# Patient Record
Sex: Male | Born: 1955 | Race: White | Hispanic: No | Marital: Married | Smoking: Never smoker
Health system: Southern US, Community
[De-identification: ages and names within clinical notes are randomized; demographics above are authoritative.]

## PROBLEM LIST (undated history)

## (undated) DIAGNOSIS — Z87442 Personal history of urinary calculi: Secondary | ICD-10-CM

## (undated) DIAGNOSIS — J4489 Other specified chronic obstructive pulmonary disease: Secondary | ICD-10-CM

## (undated) DIAGNOSIS — J309 Allergic rhinitis, unspecified: Secondary | ICD-10-CM

## (undated) DIAGNOSIS — J449 Chronic obstructive pulmonary disease, unspecified: Secondary | ICD-10-CM

## (undated) DIAGNOSIS — R911 Solitary pulmonary nodule: Secondary | ICD-10-CM

## (undated) DIAGNOSIS — J329 Chronic sinusitis, unspecified: Secondary | ICD-10-CM

## (undated) DIAGNOSIS — K219 Gastro-esophageal reflux disease without esophagitis: Secondary | ICD-10-CM

## (undated) DIAGNOSIS — J45909 Unspecified asthma, uncomplicated: Secondary | ICD-10-CM

## (undated) HISTORY — DX: Chronic sinusitis, unspecified: J32.9

## (undated) HISTORY — DX: Solitary pulmonary nodule: R91.1

## (undated) HISTORY — DX: Unspecified asthma, uncomplicated: J45.909

## (undated) HISTORY — DX: Allergic rhinitis, unspecified: J30.9

## (undated) HISTORY — DX: Personal history of urinary calculi: Z87.442

## (undated) HISTORY — PX: SYMPATHECTOMY: SHX792

## (undated) HISTORY — DX: Other specified chronic obstructive pulmonary disease: J44.89

## (undated) HISTORY — PX: LOBECTOMY: SHX5089

## (undated) HISTORY — DX: Gastro-esophageal reflux disease without esophagitis: K21.9

## (undated) HISTORY — PX: OTHER SURGICAL HISTORY: SHX169

## (undated) HISTORY — DX: Chronic obstructive pulmonary disease, unspecified: J44.9

---

## 1987-03-11 HISTORY — PX: OTHER SURGICAL HISTORY: SHX169

## 1988-03-10 HISTORY — PX: NASAL SINUS SURGERY: SHX719

## 1997-12-11 ENCOUNTER — Ambulatory Visit (HOSPITAL_COMMUNITY): Admission: RE | Admit: 1997-12-11 | Discharge: 1997-12-11 | Payer: Self-pay | Admitting: Internal Medicine

## 1998-02-06 ENCOUNTER — Encounter: Admission: RE | Admit: 1998-02-06 | Discharge: 1998-05-07 | Payer: Self-pay | Admitting: Neurosurgery

## 2002-12-28 ENCOUNTER — Encounter: Payer: Self-pay | Admitting: Internal Medicine

## 2002-12-28 ENCOUNTER — Ambulatory Visit (HOSPITAL_COMMUNITY): Admission: RE | Admit: 2002-12-28 | Discharge: 2002-12-28 | Payer: Self-pay | Admitting: Internal Medicine

## 2004-03-28 ENCOUNTER — Ambulatory Visit: Payer: Self-pay | Admitting: Internal Medicine

## 2004-04-17 ENCOUNTER — Ambulatory Visit: Payer: Self-pay | Admitting: Internal Medicine

## 2004-05-08 ENCOUNTER — Ambulatory Visit: Payer: Self-pay | Admitting: Internal Medicine

## 2004-05-22 ENCOUNTER — Ambulatory Visit: Payer: Self-pay | Admitting: Internal Medicine

## 2004-07-16 ENCOUNTER — Ambulatory Visit: Payer: Self-pay | Admitting: Internal Medicine

## 2005-04-28 ENCOUNTER — Ambulatory Visit: Payer: Self-pay | Admitting: Internal Medicine

## 2005-06-17 ENCOUNTER — Ambulatory Visit: Payer: Self-pay | Admitting: Internal Medicine

## 2005-12-30 ENCOUNTER — Ambulatory Visit: Payer: Self-pay | Admitting: Internal Medicine

## 2006-04-30 ENCOUNTER — Ambulatory Visit: Payer: Self-pay | Admitting: Internal Medicine

## 2006-06-02 ENCOUNTER — Ambulatory Visit: Payer: Self-pay | Admitting: Internal Medicine

## 2006-06-18 ENCOUNTER — Ambulatory Visit: Payer: Self-pay | Admitting: Internal Medicine

## 2006-07-09 ENCOUNTER — Ambulatory Visit: Payer: Self-pay | Admitting: Internal Medicine

## 2006-09-17 ENCOUNTER — Ambulatory Visit: Payer: Self-pay | Admitting: Internal Medicine

## 2007-01-18 ENCOUNTER — Ambulatory Visit: Payer: Self-pay | Admitting: Internal Medicine

## 2007-02-03 ENCOUNTER — Telehealth: Payer: Self-pay | Admitting: Internal Medicine

## 2007-02-15 ENCOUNTER — Telehealth (INDEPENDENT_AMBULATORY_CARE_PROVIDER_SITE_OTHER): Payer: Self-pay | Admitting: *Deleted

## 2007-03-18 DIAGNOSIS — J4489 Other specified chronic obstructive pulmonary disease: Secondary | ICD-10-CM | POA: Insufficient documentation

## 2007-03-18 DIAGNOSIS — J449 Chronic obstructive pulmonary disease, unspecified: Secondary | ICD-10-CM

## 2007-04-15 ENCOUNTER — Telehealth: Payer: Self-pay | Admitting: Internal Medicine

## 2007-05-11 ENCOUNTER — Telehealth: Payer: Self-pay | Admitting: Internal Medicine

## 2007-05-18 ENCOUNTER — Ambulatory Visit: Payer: Self-pay | Admitting: Internal Medicine

## 2007-06-08 ENCOUNTER — Encounter: Payer: Self-pay | Admitting: Internal Medicine

## 2007-10-12 ENCOUNTER — Ambulatory Visit: Payer: Self-pay | Admitting: Internal Medicine

## 2007-10-12 DIAGNOSIS — J329 Chronic sinusitis, unspecified: Secondary | ICD-10-CM

## 2007-11-25 ENCOUNTER — Encounter: Payer: Self-pay | Admitting: Internal Medicine

## 2007-11-27 ENCOUNTER — Emergency Department (HOSPITAL_COMMUNITY): Admission: EM | Admit: 2007-11-27 | Discharge: 2007-11-27 | Payer: Self-pay | Admitting: Emergency Medicine

## 2007-11-29 ENCOUNTER — Encounter: Payer: Self-pay | Admitting: Internal Medicine

## 2008-02-15 ENCOUNTER — Ambulatory Visit: Payer: Self-pay | Admitting: Internal Medicine

## 2008-06-15 ENCOUNTER — Telehealth (INDEPENDENT_AMBULATORY_CARE_PROVIDER_SITE_OTHER): Payer: Self-pay | Admitting: *Deleted

## 2008-07-14 ENCOUNTER — Telehealth (INDEPENDENT_AMBULATORY_CARE_PROVIDER_SITE_OTHER): Payer: Self-pay | Admitting: *Deleted

## 2009-01-08 ENCOUNTER — Telehealth: Payer: Self-pay | Admitting: Internal Medicine

## 2009-02-13 ENCOUNTER — Ambulatory Visit: Payer: Self-pay | Admitting: Internal Medicine

## 2009-03-15 ENCOUNTER — Telehealth (INDEPENDENT_AMBULATORY_CARE_PROVIDER_SITE_OTHER): Payer: Self-pay | Admitting: *Deleted

## 2009-05-10 ENCOUNTER — Telehealth: Payer: Self-pay | Admitting: Internal Medicine

## 2009-05-23 ENCOUNTER — Telehealth (INDEPENDENT_AMBULATORY_CARE_PROVIDER_SITE_OTHER): Payer: Self-pay | Admitting: *Deleted

## 2009-06-01 ENCOUNTER — Telehealth (INDEPENDENT_AMBULATORY_CARE_PROVIDER_SITE_OTHER): Payer: Self-pay | Admitting: *Deleted

## 2009-06-11 ENCOUNTER — Telehealth (INDEPENDENT_AMBULATORY_CARE_PROVIDER_SITE_OTHER): Payer: Self-pay | Admitting: *Deleted

## 2009-06-18 ENCOUNTER — Ambulatory Visit: Payer: Self-pay | Admitting: Internal Medicine

## 2009-06-18 ENCOUNTER — Telehealth (INDEPENDENT_AMBULATORY_CARE_PROVIDER_SITE_OTHER): Payer: Self-pay | Admitting: *Deleted

## 2009-06-18 DIAGNOSIS — J209 Acute bronchitis, unspecified: Secondary | ICD-10-CM

## 2009-06-26 ENCOUNTER — Telehealth (INDEPENDENT_AMBULATORY_CARE_PROVIDER_SITE_OTHER): Payer: Self-pay | Admitting: *Deleted

## 2009-06-27 ENCOUNTER — Encounter: Payer: Self-pay | Admitting: Internal Medicine

## 2009-06-27 ENCOUNTER — Telehealth: Payer: Self-pay | Admitting: Internal Medicine

## 2009-06-28 ENCOUNTER — Ambulatory Visit: Payer: Self-pay | Admitting: Internal Medicine

## 2009-06-28 ENCOUNTER — Telehealth: Payer: Self-pay | Admitting: Internal Medicine

## 2009-06-28 DIAGNOSIS — J984 Other disorders of lung: Secondary | ICD-10-CM

## 2009-09-25 ENCOUNTER — Ambulatory Visit: Payer: Self-pay | Admitting: Internal Medicine

## 2009-10-02 ENCOUNTER — Encounter: Payer: Self-pay | Admitting: Internal Medicine

## 2009-10-09 ENCOUNTER — Telehealth (INDEPENDENT_AMBULATORY_CARE_PROVIDER_SITE_OTHER): Payer: Self-pay | Admitting: *Deleted

## 2009-11-01 ENCOUNTER — Telehealth: Payer: Self-pay | Admitting: Internal Medicine

## 2009-11-15 ENCOUNTER — Telehealth (INDEPENDENT_AMBULATORY_CARE_PROVIDER_SITE_OTHER): Payer: Self-pay | Admitting: *Deleted

## 2009-12-17 ENCOUNTER — Ambulatory Visit: Payer: Self-pay | Admitting: Internal Medicine

## 2009-12-18 LAB — CONVERTED CEMR LAB: IgE (Immunoglobulin E), Serum: 66.4 intl units/mL (ref 0.0–180.0)

## 2010-01-17 ENCOUNTER — Ambulatory Visit: Payer: Self-pay | Admitting: Internal Medicine

## 2010-02-14 ENCOUNTER — Telehealth: Payer: Self-pay | Admitting: Internal Medicine

## 2010-02-18 ENCOUNTER — Encounter: Payer: Self-pay | Admitting: Internal Medicine

## 2010-03-08 ENCOUNTER — Telehealth (INDEPENDENT_AMBULATORY_CARE_PROVIDER_SITE_OTHER): Payer: Self-pay | Admitting: *Deleted

## 2010-03-10 HISTORY — PX: OTHER SURGICAL HISTORY: SHX169

## 2010-03-15 ENCOUNTER — Ambulatory Visit
Admission: RE | Admit: 2010-03-15 | Discharge: 2010-03-15 | Payer: Self-pay | Source: Home / Self Care | Attending: Internal Medicine | Admitting: Internal Medicine

## 2010-03-15 ENCOUNTER — Telehealth (INDEPENDENT_AMBULATORY_CARE_PROVIDER_SITE_OTHER): Payer: Self-pay | Admitting: *Deleted

## 2010-03-16 ENCOUNTER — Other Ambulatory Visit: Payer: Self-pay | Admitting: Internal Medicine

## 2010-03-18 ENCOUNTER — Ambulatory Visit
Admission: RE | Admit: 2010-03-18 | Discharge: 2010-03-18 | Payer: Self-pay | Source: Home / Self Care | Attending: Internal Medicine | Admitting: Internal Medicine

## 2010-03-18 ENCOUNTER — Ambulatory Visit: Admit: 2010-03-18 | Payer: Self-pay | Admitting: Internal Medicine

## 2010-03-18 DIAGNOSIS — J45909 Unspecified asthma, uncomplicated: Secondary | ICD-10-CM | POA: Insufficient documentation

## 2010-03-18 LAB — HEPATIC FUNCTION PANEL
ALT: 34 U/L (ref 0–53)
AST: 29 U/L (ref 0–37)
Albumin: 4 g/dL (ref 3.5–5.2)
Alkaline Phosphatase: 64 U/L (ref 39–117)
Bilirubin, Direct: 0.2 mg/dL (ref 0.0–0.3)
Total Bilirubin: 1 mg/dL (ref 0.3–1.2)
Total Protein: 7 g/dL (ref 6.0–8.3)

## 2010-03-19 ENCOUNTER — Encounter: Payer: Self-pay | Admitting: Internal Medicine

## 2010-03-26 ENCOUNTER — Telehealth (INDEPENDENT_AMBULATORY_CARE_PROVIDER_SITE_OTHER): Payer: Self-pay | Admitting: *Deleted

## 2010-04-09 NOTE — Miscellaneous (Signed)
Summary: Orders Update-CT instead of CXR/kcw  Brent at Radiology called stating that the radiologist wishes to perform CT chest noncontrast to compare with April CT instead of CXR (ordered by [name] at 7-11 OV). Spoke with [name] and he stated that the order was approved to give verbal to Brent-just as long as pt is aware and is okay with the order change. Kipp Brood requested an order be faxed to [phone]; order placed and faxed.Reynaldo Minium CMA  October 02, 2009 4:53 PM                Clinical Lists Changes  Orders: Added new Test order of CT without Contrast (CT w/o contrast) - Signed

## 2010-04-09 NOTE — Progress Notes (Signed)
Summary: Eric [name]  Phone Note Call from Patient Call back at [phone]   Caller: Spouse Call For: young Reason for Call: Talk to Nurse Summary of Call: [facility] sample has been working well - couls you call this in? [facility] [address] Initial call taken by: Eugene Gavia,  March 15, 2009 10:25 AM  Follow-up for Phone Call        Pt wants rx for dulera, states this has been working well for him. Doe she need to stop any of his other inhalers. Please advise. Carron Curie CMA  March 15, 2009 10:29 AM   Additional Follow-up for Phone Call Additional follow up Details #1::        Please put Dulera 200/5, # 1, 2 puffs and rinse twice daily, on his list. this replaces Advair. Ref as needed  Additional Follow-up by: Waymon Budge MD,  March 15, 2009 1:34 PM    Additional Follow-up for Phone Call Additional follow up Details #2::    rx sent to pharmacy, wife aware rx sent and also that this med will replce the advair Follow-up by: Philipp Deputy CMA,  March 15, 2009 2:24 PM  New/Updated Medications: DULERA 200-5 MCG/ACT AERO (MOMETASONE FURO-FORMOTEROL FUM) 2 pufs twice a day and rinse after each use Prescriptions: DULERA 200-5 MCG/ACT AERO (MOMETASONE FURO-FORMOTEROL FUM) 2 pufs twice a day and rinse after each use  #1 x 6   Entered by:   Philipp Deputy CMA   Authorized by:   Waymon Budge MD   Signed by:   Philipp Deputy CMA on 03/15/2009   Method used:   [facility]  [address] [id number]       [address]       [place], [place]  [address]       Ph: [phone] or [phone]       Fax: [phone]   RxID:   [id number]

## 2010-04-09 NOTE — Assessment & Plan Note (Signed)
Summary: 3 months/apc   Primary Provider/Referring Provider:  Kirby [name]  CC:  3 month follow up visit-COPD and asthma. Denies any asthma attacks but agrees to slight SOB and wheezing.Marland Kitchen  History of Present Illness: June 18, 2009- Chronic asthma/ COPD, rhinosinusitis He comes for acute visit reporting onset over past 3 days of lightly pleuritic pain in left mid chest with green, blood streaked sputum but no fever. He thinks this may be a recurrence of the moraxella. bronchopneumonia pattern that responded to augmentin x 14 days. Repsonse was slower and less clear- cut than in past. 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 Augmentin gave best zone size on the plate last time. No sinus problem or headache, but he keeps nasal congestion during bronchitis flares. He is currently on 15 mg prednisone daily.  June 28, 2009- Chronic asthma,/ COPD, rhinosinusitis...................Marland Kitchenwife here Sputum has cleared from brown to his usual yellow after 7 days Avelox finshed 5 days ago. Still a little more dyspneic and raspy through his chest than usual, without fever. Because of persistent pleuritic pain left anterolateral chest>> D-dimer elevated, raising concern VTE. CT report reviewed with him- density posterior right lung. DDX includes mucus plug, pneumonia, ATX or less likely- a tumor. We will get the disc sent over.  September 25, 2009- Chronic obstructive asthma, rhinosinusitis, Abnormal Xray We gave prednisone and Cefdinir last visit. He has remained symptomatic on prednisone 30 mg over the last few weeks. ENT gave him clindamycin x 7 days for sialitis/ salivary stone. He just finished a second round of clindamycin and with that, he has been down to 10 mg prednisone daily fo the last 5 days. We discussed anaerobic coverage, possibly with flagyl which might be better tolerated than clindamycin. Marland Kitchen Resection has been suggested. CT chest in [facility] in April showed a soft tissue nodue suspicious  for lymph node- to be followed.    Asthma History    Initial Asthma Severity Rating:    Age range: 12+ years    Symptoms: daily    Nighttime Awakenings: 3-4/month    Interferes w/ normal activity: minor limitations    SABA use (not for EIB): several times per day    Asthma Severity Assessment: Severe Persistent   Preventive Screening-Counseling & Management  Alcohol-Tobacco     Smoking Status: never  Current Medications (verified): 1)  Fluticasone Propionate 50 Mcg/act Susp (Fluticasone Propionate) .... Spray 1 Spray Into Both  Nostrils Once A Day 2)  Ventolin Hfa 108 (90 Base) Mcg/act Aers (Albuterol Sulfate) .... 2 Puffs Four Times A Day As Needed Rescue 3)  Albuterol Sulfate 4 Mg  Xr12h-Tab (Albuterol Sulfate) .Marland Kitchen.. 1 Twice Daily As Needed 4)  Prednisone 10 Mg Tabs (Prednisone) .... Take 1 By Mouth Once Daily 5)  Albuterol Sulfate (2.5 Mg/77ml) 0.083%  Nebu (Albuterol Sulfate) .... Four Times Daily or Every 6 Hours As Needed 6)  Ipratropium Bromide 0.02 % Soln (Ipratropium Bromide) .Marland Kitchen.. 1 Vial in Hhn Four Times A Day As Needed 7)  Advair Diskus 250-50 Mcg/dose Aepb (Fluticasone-Salmeterol) .Marland Kitchen.. 1 Puff Two Times A Day and Rinse Mouth Well 8)  Calcium-Vitamin D 600-200 Mg-Unit Tabs (Calcium-Vitamin D) .... Two Times A Day 9)  Mucinex 600 Mg Xr12h-Tab (Guaifenesin) .... As Needed  Allergies (verified): 1)  ! 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  The patient denies shortness of breath at rest, productive cough, coughing up blood, chest pain, irregular heartbeats, acid heartburn, indigestion, loss of appetite, weight change, abdominal pain, difficulty swallowing, sore throat, tooth/dental problems, headaches, nasal congestion/difficulty breathing through nose, and sneezing.         Some soreness in hips at lower prenisone dose.  Vital Signs:  Patient profile:   55 year old male Height:      68 inches Weight:      180.38 pounds BMI:     27.53 O2 Sat:      95 % on Room air Pulse rate:   84 / minute BP sitting:   118 / 88  (right arm) Cuff size:   regular  Vitals Entered By: Reynaldo Minium CMA (September 25, 2009 4:10 PM)  O2 Flow:  Room air CC: 3 month follow up visit-COPD and asthma. Denies any asthma attacks but agrees to slight SOB and wheezing.   Physical Exam  Additional Exam:  General: A/Ox3; pleasant and cooperative, NAD, SKIN: no rash, lesions NODES: no lymphadenopathy HEENT: [place]/AT, EOM- WNL, Conjuctivae- clear, PERRLA, periorbital edema, TM-WNL, Nose- mucus bridging, stuffy, Throat-  NECK: Supple w/ fair ROM, JVD- none, normal carotid impulses w/o bruits Thyroid- CHEST: coarse wheeze/ rhonchi bilaterally, unlabored, without rales or rub.Better than usual. HEART: RRR, no m/g/r heard ABDOMEN: Soft and nl;  ZOX:WRUE, nl pulses, no edema  NEURO: Grossly intact to observation      Impression & Recommendations:  Problem # 1:  ASTHMA (ICD-493.90) Chronic obstructive asthma. We discussed possibility that an antibiotic addressing his salivary anaerobes might calm his asthma and allow reduced steroids. We will give flagyl, which is  likely to cause less problem with colitis. Discussed steroid withdrawal.  Problem # 2:  LUNG NODULE (ICD-518.89)  We reviewed the CT report from April again. We will have him get a f/u CXR in [facility] to compare. We can do another CT if necessary.  Medications Added to Medication List This Visit: 1)  Prednisone 10 Mg Tabs (Prednisone) .... Take 1 by mouth once daily 2)  Advair Diskus 250-50 Mcg/dose Aepb (Fluticasone-salmeterol) .Marland Kitchen.. 1 puff two times a day and rinse mouth well 3)  Metronidazole 250 Mg Tabs (Metronidazole) .Marland Kitchen.. 1 three times a day  Other Orders: Est. Patient Level III ([address])  Patient Instructions: 1)  Please schedule a follow-up appointment in 3 months. 2)  Script sent for flagyl/ metronidazole 3)  Handwritten order for CXR to follow up the right lung nodule seen on CT in April. When I see that report we can discuss whether a CXR is adequate for tracking this. Prescriptions: METRONIDAZOLE 250 MG TABS (METRONIDAZOLE) 1 three times a day  #21 x 2   Entered and Authorized by:   Waymon Budge MD   Signed by:   Waymon Budge MD on 09/25/2009   Method used:   [facility]  [address] [id number]       [address]       [place], [place]  [address]       Ph: [phone] or [phone]       Fax: [phone]   RxID:   [id number]

## 2010-04-09 NOTE — Progress Notes (Signed)
Summary: asthma flare > ok for pred taper   Phone Note Call from Patient Call back at [phone]   Caller: Patient Call For: young Reason for Call: Talk to Nurse Summary of Call: trouble w/asthma, no fever, tightness in chest, congestion, going on a couple of weeks. [facility] [address] Initial call taken by: Eugene Gavia,  November 15, 2009 9:55 AM  Follow-up for Phone Call        called spoke with patient who states that he is having an asthma flare x2weeks w/ tightness in chest, wheezing, increased SOB and prod cough with a slight yellow mucus.  pt denies f/c/s.  pt states that 2 weeks ago (at onset of symptoms) he increased his prednisone to 30mg  with no relief; increased prednisone to 40mg  2 days ago.  would also like a new rx for prednisone since he had to increase the dose - requesting 5mg  #200.  please advise, thanks!  ALLERGIES: e-mycin, pcn.  last ov 09-25-09, next 12-17-09. Follow-up by: Boone Master CNA/MA,  November 15, 2009 10:39 AM  Additional Follow-up for Phone Call Additional follow up Details #1::        I sent the prednisone script [facility] [address] Please let him know, and ask him to let me know if he doesn't get better. Additional Follow-up by: Waymon Budge MD,  November 15, 2009 12:19 PM    Additional Follow-up for Phone Call Additional follow up Details #2::    ATC pt to inform him of [name]'s recs as stated above.  LMOM TCB. Boone Master CNA/MA  November 15, 2009 12:27 PM   PT RETURNED CALL [phone].Marland KitchenChantel Bowne  November 15, 2009 12:39 PM  Additional Follow-up for Phone Call Additional follow up Details #3:: Details for Additional Follow-up Action Taken: Spoke with pt and notified that rx was sent for prednisone and that he should call back i57f not improving.  Pt verbalized understanding. Additional Follow-up by: Vernie Murders,  November 15, 2009 1:50 PM  New/Updated Medications: PREDNISONE 5 MG TABS (PREDNISONE) 40 mg daily, taper as  directed Prescriptions: PREDNISONE 5 MG TABS (PREDNISONE) 40 mg daily, taper as directed  #200 x 0   Entered and Authorized by:   Waymon Budge MD   Signed by:   Waymon Budge MD on 11/15/2009   Method used:   [facility]        [facility]  [address] [id number]       [address]       [place], [place]  [address]       Ph: [phone] or [phone]       Fax: [phone]   RxID:   [id number]

## 2010-04-09 NOTE — Progress Notes (Signed)
Summary: Wants to try Daliresp  Phone Note Call from Patient Call back at [phone]   Caller: Patient Call For: Eric [name] Reason for Call: Talk to Doctor Summary of Call: pt want to know about new medication for asthma dr Eric [name] talked to him about. Initial call taken by: Rickard Patience,  November 01, 2009 10:38 AM  Follow-up for Phone Call        Pt c/o increased usage of Ventolin HFA 2 puffs every 6 hours, neb treatments three times a day, and remaining medications as directed. Is doing Prednisone 5mg /10mg  rotation and wishes not to increase. Pt c/o increased chest congestion, non-productive cough, and wheezing. Pt denies fever. Pt does not recall the name of medication he and CY discussed during last OV but would like to try the sample now if CY thinks it is okay. Please advise. Thanks. Eric [name] CMA  November 01, 2009 11:01 AM   Allergies (verified):  1)  ! Pcn 2)  Erythromycin Ethylsuccinate    Additional Follow-up for Phone Call Additional follow up Details #1::        Discussed Daliresp, especially reported side effects including GI distress, depression, weight loss and the usual caution with any new drug. He wants to try it and will pick up samples. He has been feeling fatigued while alternating pred 10 mg/ 5 mg every other day. Discussed adrenal insufficiency. Additional Follow-up by: Waymon Budge MD,  November 01, 2009 9:36 PM    New/Updated Medications: * DALIRESP 500 MCG 1 daily Prescriptions: DALIRESP 500 MCG 1 daily  #30 x 5   Entered and Authorized by:   Waymon Budge MD   Signed by:   Waymon Budge MD on 11/01/2009   Method used:   Print then Give to Patient   RxID:   [id number]   Appended Document: Wants to try Daliresp    Clinical Lists Changes  Medications: Rx of DALIRESP 500 MCG 1 daily;  #30 x 5;  Signed;  Entered by: Reynaldo Minium CMA;  Authorized by: Waymon Budge MD;  Method used: [facility]  [address] [id number] [address], [place], [place]  [address], Ph: [phone] or [phone], Fax: [phone]    Prescriptions: DALIRESP 500 MCG 1 daily  #30 x 5   Entered by:   Reynaldo Minium CMA   Authorized by:   Waymon Budge MD   Signed by:   Reynaldo Minium CMA on 11/02/2009   Method used:   Telephoned to ...       [facility]  [address] [id number]       [address]       [place], [place]  [address]       Ph: [phone] or [phone]       Fax: [phone]   RxID:   [id number]

## 2010-04-09 NOTE — Letter (Signed)
Summary: Orders for Labs & CTA Chest/[facility]  Orders for Labs & CTA Chest/[facility]   Imported By: Sherian Rein 07/04/2009 09:43:44  _____________________________________________________________________  External Attachment:    Type:   Image     Comment:   External Document

## 2010-04-09 NOTE — Progress Notes (Signed)
Summary: rx  Phone Note Call from Patient Call back at [phone]   Caller: Spouse-Laura Call For: young Reason for Call: Refill Medication, Talk to Nurse Summary of Call: pt continues to have congestion, wheezing, feels he needs another round of antibiotics.   [facility] for [facility]ill [facility] [address] Initial call taken by: Eugene Gavia,  June 01, 2009 10:19 AM  Follow-up for Phone Call        called and spoke with pt.  pt states he was give rx for Augmentin 875 two times a day for 7 days on 05-23-2009.  pt states he has finished abx but believes he needs another round.  pt c/o still having chest congestion, coughing up yellow sputum and increased sob.  please advise.  thanks.  Aundra Millet Reynolds LPN  June 01, 2009 10:26 AM   allergies:  avelox, e-mycin  Additional Follow-up for Phone Call Additional follow up Details #1::        Please let pt know that Augmentin refill has been sent to pharmacy per [name].Reynaldo Minium CMA  June 01, 2009 4:17 PM      Additional Follow-up for Phone Call Additional follow up Details #2::    called and spoke with pt's wife and informed her rx sent to pharmacy.  Aundra Millet Reynolds LPN  June 01, 2009 4:41 PM

## 2010-04-09 NOTE — Assessment & Plan Note (Signed)
Summary: rov 6 months///kp   Primary Provider/Referring Provider:  [name]  CC:  6 month follow up visit-COPD and asthma; SOB, wheezing, chest congestion, and cough-occasionally productive-slight yellow in color..  History of Present Illness: June 28, 2009- Chronic asthma,/ COPD, rhinosinusitis...................Marland Kitchenwife here Sputum has cleared from brown to his usual yellow after 7 days Avelox finshed 5 days ago. Still a little more dyspneic and raspy through his chest than usual, without fever. Because of persistent pleuritic pain left anterolateral chest>> D-dimer elevated, raising concern VTE. CT report reviewed with him- density posterior right lung. DDX includes mucus plug, pneumonia, ATX or less likely- a tumor. We will get the disc sent over.  September 25, 2009- Chronic obstructive asthma, rhinosinusitis, Abnormal Xray We gave prednisone and Cefdinir last visit. He has remained symptomatic on prednisone 30 mg over the last few weeks. ENT gave him clindamycin x 7 days for sialitis/ salivary stone. He just finished a second round of clindamycin and with that, he has been down to 10 mg prednisone daily fo the last 5 days. We discussed anaerobic coverage, possibly with flagyl which might be better tolerated than clindamycin. Marland Kitchen Resection has been suggested. CT chest in [facility] in April showed a soft tissue nodue suspicious for lymph node- to be followed.  December 17, 2009- Chronic obstructive asthma, rhinosinusitis, Abnormal Xray cc: 6 month follow up visit-COPD and asthma; SOB, wheezing, chest congestion, cough-occasionally productive-slight yellow in color. Increased SOB, wheeze, chest congestion. Easy DOE. Coughs if he laughs. He can't get below prednisone alternating 15 w/ 20 mg every other day. He tried [employer]- thought it depressed him. We discussed this. We discussed steroid sparing strategies. Discussed trial of Xolair years ago- stopped when he thought it caused sinusitis. CT chest done  at [facility] 10/02/09- resolution of the RLL density reported in April. I suspect this was retained mucus.  Dr Lazarus Salines had seen him and done sinus CT for chronic sinusitis, recommending surgery. Rocky Link is not interested in sinus surgery but might be willing to discuss nasal aerosol antibiotics.   Asthma History    Asthma Control Assessment:    Age range: 12+ years    Symptoms: >2 days/week    Nighttime Awakenings: 0-2/month    Interferes w/ normal activity: some limitations    SABA use (not for EIB): >2 days/week    Asthma Control Assessment: Not Well Controlled   Preventive Screening-Counseling & Management  Alcohol-Tobacco     Smoking Status: never  Current Medications (verified): 1)  Fluticasone Propionate 50 Mcg/act Susp (Fluticasone Propionate) .... Spray 1 Spray Into Both  Nostrils Once A Day 2)  Ventolin Hfa 108 (90 Base) Mcg/act Aers (Albuterol Sulfate) .... 2 Puffs Four Times A Day As Needed Rescue 3)  Albuterol Sulfate 4 Mg  Xr12h-Tab (Albuterol Sulfate) .Marland Kitchen.. 1 Twice Daily As Needed 4)  Prednisone 10 Mg Tabs (Prednisone) .... Take 1 By Mouth Once Daily 5)  Albuterol Sulfate (2.5 Mg/35ml) 0.083%  Nebu (Albuterol Sulfate) .... Four Times Daily or Every 6 Hours As Needed 6)  Ipratropium Bromide 0.02 % Soln (Ipratropium Bromide) .Marland Kitchen.. 1 Vial in Hhn Four Times A Day As Needed 7)  Advair Diskus 250-50 Mcg/dose Aepb (Fluticasone-Salmeterol) .Marland Kitchen.. 1 Puff Two Times A Day and Rinse Mouth Well 8)  Calcium-Vitamin D 600-200 Mg-Unit Tabs (Calcium-Vitamin D) .... Two Times A Day 9)  Mucinex 600 Mg Xr12h-Tab (Guaifenesin) .... As Needed 10)  Metronidazole 250 Mg Tabs (Metronidazole) .Marland Kitchen.. 1 Three Times A Day 11)  Prednisone 5 Mg  Tabs (Prednisone) .... 40 Mg Daily, Taper As Directed  Allergies (verified): 1)  ! Pcn 2)  ! * Daliresp 3)  Erythromycin Ethylsuccinate  Past History:  Past Medical History: Last updated: 06/28/2009 SINUSITIS (ICD-473.9) ALLERGIC RHINITIS (ICD-477.9) C O P D  (ICD-496) ASTHMA (ICD-493.90) Lung nodule- 13x41mm posterior RLL CT 06/27/09  Past Surgical History: Last updated: 05/18/2007 Right middle lobectomy age 66 with tracheostomy Right carotid body removed (sympathectomy) for asthma many asthma hospitalizations ventilator dependent respiratory failure- asthma- 1989  Family History: Last updated: 05/18/2007 mother- lymphoepithelial cancer Father, brother- allergic asthma MGF- testicular cancer  Social History: Last updated: 05/18/2007 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  The patient denies coughing up blood, chest pain, irregular heartbeats, acid heartburn, indigestion, loss of appetite, weight change, abdominal pain, difficulty swallowing, sore throat, tooth/dental problems, headaches, itching, ear ache, anxiety, depression, hand/feet swelling, rash, change in color of mucus, and fever.    Vital Signs:  Patient profile:   55 year old male Height:      68 inches Weight:      180.25 pounds BMI:     27.51 O2 Sat:      95 % on Room air Pulse rate:   70 / minute BP sitting:   114 / 80  (left arm) Cuff size:   regular  Vitals Entered By: Reynaldo Minium CMA (December 17, 2009 4:17 PM)  O2 Flow:  Room air CC: 6 month follow up visit-COPD and asthma; SOB, wheezing, chest congestion, cough-occasionally productive-slight yellow in color.   Physical Exam  Additional Exam:  General: A/Ox3; pleasant and cooperative, NAD, SKIN: no rash, lesions NODES: no lymphadenopathy HEENT: [place]/AT, EOM- WNL, Conjuctivae- clear, PERRLA, periorbital edema, TM-WNL, Nose- mucus bridging, stuffy, Throat-  NECK: Supple w/ fair ROM, JVD- none, normal carotid impulses w/o bruits  Thyroid- CHEST: coarse wheeze/ rhonchi bilaterally, unlabored HEART: RRR, no m/g/r heard ABDOMEN: Soft and nl;  RSW:NIOE, nl pulses, no edema  NEURO: Grossly intact to observation      Impression & Recommendations:  Problem # 1:  ASTHMA (ICD-493.90) Severe chonic steroid dependent Long talk again  Retry Daliresp IgE for retry Xolair Offerred referral again- [facility] or [facility] ? depo 80 for comparison  Problem # 2:  RHINOSINUSITIS, RECURRENT (ICD-473.9) Discussed return to Dr Lazarus Salines  to consider nasal antibiotics, perhap s after heavier steroid suppressive course so antibiotics can get in.  He has reun high IgE and we discussed allergic fungal or airway/ ABPA syndromes again.  Medications Added to Medication List This Visit: 1)  Daliresp 500 Mcg Tabs (Roflumilast) .Marland Kitchen.. 1 daily with food 2)  Singulair 10 Mg Tabs (Montelukast sodium) .Marland Kitchen.. 1 daily  Other Orders: Est. Patient Level IV (70350) T-IgE (Immunoglobulin E) (09381-82993) Admin of Therapeutic Inj  intramuscular or subcutaneous (71696) Depo- Medrol 80mg  (J1040)  Patient Instructions: 1)  Please schedule a follow-up appointment in 1 month. We will look at status then and consider referral to [facility] for second opinion. Also consider return to Dr Lazarus Salines to consider nasal antibiotic aerosol.  2)  Depo 80 3)  If you need to increase prednisone up to 40 or so mg/ day for a few days, then do. 4)  Sample/ script Singulair  10 mg, 1 daily 5)  Sample/ script  Daliresp - 1 daily with food 6)  lab Prescriptions: SINGULAIR 10 MG TABS (MONTELUKAST SODIUM) 1 daily  #30 x prn   Entered and Authorized by:   Waymon Budge MD   Signed by:   Waymon Budge MD on 12/17/2009   Method used:   Print then Give to Patient   RxID:   [id number] DALIRESP 500 MCG TABS (ROFLUMILAST) 1 daily with food  #30 x prn   Entered and Authorized by:   Waymon Budge MD   Signed by:   Waymon Budge MD on 12/17/2009   Method used:   Print  then Give to Patient   RxID:   [id number] IPRATROPIUM BROMIDE 0.02 % SOLN (IPRATROPIUM BROMIDE) 1 vial in HHN four times a day as needed  #360 x 3   Entered and Authorized by:   Waymon Budge MD   Signed by:   Waymon Budge MD on 12/17/2009   Method used:   Print then Give to Patient   RxID:   [id number] ALBUTEROL SULFATE (2.5 MG/3ML) 0.083%  NEBU (ALBUTEROL SULFATE) Four times daily or every 6 hours as needed  #360 x 3   Entered and Authorized by:   Waymon Budge MD   Signed by:   Waymon Budge MD on 12/17/2009   Method used:   Print then Give to Patient   RxID:   [id number]      Medication Administration  Injection # 1:    Medication: Depo- Medrol 80mg     Diagnosis: ASTHMA (ICD-493.90)    Route: SQ    Site: RUOQ gluteus    Exp Date: 06/2012    Lot #: [id number]    Mfr: [employer]    Patient tolerated injection without complications    Given by: Reynaldo Minium CMA (December 17, 2009 5:32 PM)  Orders Added: 1)  Est. Patient Level IV [42595] 2)  T-IgE (Immunoglobulin E) [63875-64332] 3)  Admin of Therapeutic Inj  intramuscular or subcutaneous [96372] 4)  Depo- Medrol 80mg  [J1040]

## 2010-04-09 NOTE — Progress Notes (Signed)
Summary: prescript for albuterol nebs  Phone Note Call from Patient Call back at [phone]   Caller: Patient Call For: young Summary of Call: need albuterol sulfate nebulizer .083 % sulution sent to [facility]  Initial call taken by: Rickard Patience,  June 11, 2009 11:47 AM  Follow-up for Phone Call        [facility] informing pt rx sent to pharmacy.  Aundra Millet Reynolds LPN  June 12, 4538 11:54 AM     Prescriptions: ALBUTEROL SULFATE (2.5 MG/3ML) 0.083%  NEBU (ALBUTEROL SULFATE) Four times daily or every 6 hours as needed  #360 x 1   Entered by:   Arman Filter LPN   Authorized by:   Waymon Budge MD   Signed by:   Arman Filter LPN on 98/01/9146   Method used:   Electronically to        [facility]* (mail-order)             ,          Ph: [phone]       Fax: [phone]   RxID:   [id number]

## 2010-04-09 NOTE — Progress Notes (Signed)
Summary: speak to nurse  Phone Note Call from Patient Call back at [phone]   Caller: Spouse//laura Call For: young Summary of Call: Pt did sputum grew moraxella would like an antibiotic called in preferabley augmentin.//[facility] Initial call taken by: Darletta Moll,  May 23, 2009 10:03 AM  Follow-up for Phone Call        [facility] Carron Curie CMA  May 23, 2009 10:33 AM  wife states dr young is aware pt does these cultures on himself when needed and this culture had a good zone for treatment with augmentin wants rx for this [facility] to [facility] [address]-no need for call back unless cy is not going to give med--pls advise  Follow-up by: Philipp Deputy CMA,  May 23, 2009 11:04 AM  Additional Follow-up for Phone Call Additional follow up Details #1::        Per [name]- give Augmentin 875mg . #14 take 1 by mouth two times a day. 1 refill.Reynaldo Minium CMA  May 23, 2009 11:26 AM     New/Updated Medications: AUGMENTIN 875-125 MG TABS (AMOXICILLIN-POT CLAVULANATE) 1 by mouth twice a day Prescriptions: AUGMENTIN 875-125 MG TABS (AMOXICILLIN-POT CLAVULANATE) 1 by mouth twice a day  #14 x 0   Entered by:   Philipp Deputy CMA   Authorized by:   Waymon Budge MD   Signed by:   Philipp Deputy CMA on 05/23/2009   Method used:   [facility]  [address] [id number]       [address]       [place], [place]  [address]       Ph: [phone] or [phone]       Fax: [phone]   RxID:   [id number]

## 2010-04-09 NOTE — Assessment & Plan Note (Signed)
Summary: f/u per [name] ///kp   Primary Quinisha Mould/Referring Isreal Moline:  [name]  CC:  follow up visit-stillhaving SOB and wheezing at times; Breathing better than last time.Marland Kitchen  History of Present Illness: December 17, 2009- Chronic obstructive asthma, rhinosinusitis, Abnormal Xray cc: 6 month follow up visit-COPD and asthma; SOB, wheezing, chest congestion, cough-occasionally productive-slight yellow in color. Increased SOB, wheeze, chest congestion. Easy DOE. Coughs if he laughs. He can't get below prednisone alternating 15 w/ 20 mg every other day. He tried [employer]- thought it depressed him. We discussed this. We discussed steroid sparing strategies. Discussed trial of Xolair years ago- stopped when he thought it caused sinusitis. CT chest done at [facility] 10/02/09- resolution of the RLL density reported in April. I suspect this was retained mucus.  Dr Lazarus Salines had seen him and done sinus CT for chronic sinusitis, recommending surgery. Rocky Link is not interested in sinus surgery but might be willing to discuss nasal aerosol antibiotics.   January 17, 2010-  Chronic obstructive asthma, rhinosinusitis, Abnormal Xray Currently doing better than last visit, but not sure why. He is taking SlowMag 1 tab. Put off retrying Daliresp because he has felt down in the dumps as he pulled prednisone down to 10 alt w/ 5 every other day. Not reallly sure why he is better. Daily still nasal congestion slighlty yellow. Cough is comfortably productive- slightly yellow. Sense of smell again left as he reduced prednisone, feeling a little stuffy. Right ear won't pop. Tried Xolair in past but stopped during serial sinus infections. We discussed current understanding of side effects. He willl check on opneumovax status  Asthma History    Asthma Control Assessment:    Age range: 12+ years    Symptoms: throughout the day    Nighttime Awakenings: 0-2/month    Interferes w/ normal activity: some limitations    SABA use  (not for EIB): >2 days/week    Asthma Control Assessment: Very Poorly Controlled   Preventive Screening-Counseling & Management  Alcohol-Tobacco     Smoking Status: never  Current Medications (verified): 1)  Fluticasone Propionate 50 Mcg/act Susp (Fluticasone Propionate) .... Spray 1 Spray Into Both  Nostrils Once A Day 2)  Ventolin Hfa 108 (90 Base) Mcg/act Aers (Albuterol Sulfate) .... 2 Puffs Four Times A Day As Needed Rescue 3)  Albuterol Sulfate 4 Mg  Xr12h-Tab (Albuterol Sulfate) .Marland Kitchen.. 1 Twice Daily As Needed 4)  Prednisone 10 Mg Tabs (Prednisone) .... Take 1/2 By Mouth Every Other Day and Then 1 By Mouth Every Other Day 5)  Albuterol Sulfate (2.5 Mg/61ml) 0.083%  Nebu (Albuterol Sulfate) .... Four Times Daily or Every 6 Hours As Needed 6)  Ipratropium Bromide 0.02 % Soln (Ipratropium Bromide) .Marland Kitchen.. 1 Vial in Hhn Four Times A Day As Needed 7)  Advair Diskus 250-50 Mcg/dose Aepb (Fluticasone-Salmeterol) .Marland Kitchen.. 1 Puff Two Times A Day and Rinse Mouth Well 8)  Calcium-Vitamin D 600-200 Mg-Unit Tabs (Calcium-Vitamin D) .... Two Times A Day 9)  Mucinex 600 Mg Xr12h-Tab (Guaifenesin) .... As Needed 10)  Metronidazole 250 Mg Tabs (Metronidazole) .Marland Kitchen.. 1 Three Times A Day 11)  Daliresp 500 Mcg Tabs (Roflumilast) .Marland Kitchen.. 1 Daily With Food 12)  Singulair 10 Mg Tabs (Montelukast Sodium) .Marland Kitchen.. 1 Daily  Allergies (verified): 1)  ! Pcn 2)  ! * Daliresp 3)  Erythromycin Ethylsuccinate  Past History:  Past Medical History: Last updated: 06/28/2009 SINUSITIS (ICD-473.9) ALLERGIC RHINITIS (ICD-477.9) C O P D (ICD-496) ASTHMA (ICD-493.90) Lung nodule- 13x66mm posterior RLL CT 06/27/09  Past  Surgical History: Last updated: 05/18/2007 Right middle lobectomy age 83 with tracheostomy Right carotid body removed (sympathectomy) for asthma many asthma hospitalizations ventilator dependent respiratory failure- asthma- 1989  Family History: Last updated: 05/18/2007 mother- lymphoepithelial cancer Father,  brother- allergic asthma MGF- testicular cancer  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  The patient denies shortness of breath at rest, non-productive cough, coughing up blood, chest pain, irregular heartbeats, acid heartburn, indigestion, loss of appetite, weight change, abdominal pain, difficulty swallowing, sore throat, tooth/dental problems, headaches, sneezing, itching, ear ache, hand/feet swelling, joint stiffness or pain, and fever.    Vital Signs:  Patient profile:   55 year old male Height:      68 inches Weight:      181.38 pounds BMI:     27.68 O2 Sat:      96 % on Room air Pulse rate:   83 / minute BP sitting:   124 / 72  (right arm) Cuff size:   regular  Vitals Entered By: Reynaldo Minium CMA (January 17, 2010 4:35 PM)  O2 Flow:  Room air CC: follow up visit-stillhaving SOB and wheezing at times; Breathing better than last time.   Physical Exam  Additional Exam:  General: A/Ox3; pleasant and cooperative, NAD, SKIN: no rash, lesions NODES: no lymphadenopathy HEENT: [place]/AT, EOM- WNL, Conjuctivae- clear, PERRLA, increased  periorbital edema, TM-WNL, Nose- mucus bridging, stuffy, Throat-  NECK: Supple w/ fair ROM, JVD- 1+/ fill from below, normal carotid impulses w/o bruits Thyroid- CHEST: no wheeze, fair airflow. HEART: RRR, no m/g/r heard ABDOMEN: Soft and nl;  ZOX:WRUE, nl pulses, no edema  NEURO: Grossly intact to observation      Impression & Recommendations:  Problem # 1:  ASTHMA (ICD-493.90) Chronic and potentially dangerous if out of control. We have reviewed exacerbating factors and available treatments again. Try Zyflo instead of singulair Try Xolair again.  IgE of 66.4 is in range. We discussed the factors which relate to success w/ Xolair.  Taper prednisone only very slowly as tolerated  Problem # 2:  LUNG NODULE (ICD-518.89) This was not seen on follow-up imaging. We will look again at a later time, but it was probably a mucus plug or vascular shadow..   Problem # 3:  RHINOSINUSITIS, RECURRENT (ICD-473.9) Chronic sinus disease is part of the same airway inflammatory process. We will consider when to update imaging.   Medications Added to Medication List This Visit: 1)  Prednisone 10 Mg Tabs (Prednisone) .... Take 1/2 by mouth every other day and then 1 by mouth every other day 2)  Zyflo Cr 600 Mg Xr12h-tab (Zileuton) .... 2 two times a day  Other Orders: Est. Patient Level IV (45409)  Patient Instructions: 1)  Please schedule a follow-up appointment in 2 months. 2)  Zyflo CR samples and script, 2 two times a day  3)  We will start Xolair application 4)  Taper prednisone by 5mg / day every 2 weeks as tolerated Prescriptions: ZYFLO CR 600 MG XR12H-TAB (ZILEUTON) 2 two times a day  #120 x prn   Entered and Authorized by:   Waymon Budge MD   Signed by:   Waymon Budge MD on 01/17/2010  Method used:   Print then Give to Patient   RxID:   [id number]

## 2010-04-09 NOTE — Progress Notes (Signed)
Summary: prednisone refill  Phone Note Call from Patient   Caller: Spouse Call For: YOUNG Summary of Call: pt's spouse [facility] [address] [facility] a request for refill of prednisone (faxing since last wk). needs this asap. caller's phone cut in and out and hung up. i didn't get a call back # before we were disconected.  Initial call taken by: Tivis Ringer, CNA,  May 10, 2009 4:01 PM  Follow-up for Phone Call        rx sent. pt aware.Carron Curie CMA  May 10, 2009 4:18 PM     Prescriptions: PREDNISONE 20 MG TABS (PREDNISONE) 1 daily or as directed  #50 x 3   Entered by:   Carron Curie CMA   Authorized by:   Waymon Budge MD   Signed by:   Carron Curie CMA on 05/10/2009   Method used:   [facility]  [address] [id number]       [address]       [place], [place]  [address]       Ph: [phone] or [phone]       Fax: [phone]   RxID:   [id number]

## 2010-04-09 NOTE — Progress Notes (Signed)
Summary: CT RESULTS/ APPT?  Phone Note Call from Patient   Caller: Patient Call For: Byrd Rushlow Summary of Call: PT WANTS THE RESULTS OF CT DONE YESTERDAY AND ALSO WANTS TO KNOW IF HE NEEDS TO KEEP HIS APPT FOR TODAY AT 2:15. CALL CELL [phone] OR [phone] Initial call taken by: Tivis Ringer, CNA,  June 28, 2009 10:05 AM  Follow-up for Phone Call        spoke with [name], cy has results but wants pt to come in and discuss at 2:15. pt asdvised. Carron Curie CMA  June 28, 2009 10:33 AM

## 2010-04-09 NOTE — Progress Notes (Signed)
Summary: results of Chest CT  Phone Note Call from Patient Call back at [phone]   Caller: Spouse-Laura Rumbaugh Call For: young Reason for Call: Talk to Nurse, Lab or Test Results Summary of Call: had ct last week @ [facility] - would like the results. Initial call taken by: Eugene Gavia,  October 09, 2009 11:59 AM  Follow-up for Phone Call        called and spoke with pt's wife. wife states CY ordered for pt to have CT chest last week at [facility] to compare to prior Cts.  Wife calling for these results.  Will forward message to [name] to get CT report from [facility] for CY to view.  Aundra Millet Reynolds LPN  October 09, 2009 12:12 PM    Spoke with CT office(Brenda) at [facility]; faxing report to [place].Reynaldo Minium CMA  October 09, 2009 12:31 PM   Additional Follow-up for Phone Call Additional follow up Details #1::        Results recieved and given to [facility] for review with message.Reynaldo Minium CMA  October 09, 2009 1:17 PM     Additional Follow-up for Phone Call Additional follow up Details #2::    CT is improved. A rounded are they were concerned about is now gone, so it may have been a mucus plug. There is a smaller area they think is just a lymph node, that is of very low concern. The radiologist suggests looking at it again in 6 months. We will talk about it next time Rocky Link comes in. Follow-up by: Waymon Budge MD,  October 09, 2009 1:27 PM  Additional Follow-up for Phone Call Additional follow up Details #3:: Details for Additional Follow-up Action Taken: [facility].  Aundra Millet Reynolds LPN  October 09, 2009 2:03 PM   pt's spouse, Vernona Rieger, returned call.  advised of CT results as stated by [name] above.  Vernona Rieger verbalized her understanding.  pt will keep upcoming Oct appt with [name]. Boone Master CNA/MA  October 09, 2009 3:30 PM

## 2010-04-09 NOTE — Assessment & Plan Note (Signed)
Summary: still sick after 2 abx - ok per Renette Butters / cj   Primary Provider/Referring Provider:  [name]  CC:  Accute visit-cough-green; sinus headache recently; has had 2 rounds of abx's and no better. Pain in Left side of Chest as well.Marland Kitchen  History of Present Illness:  02/15/08- Chronic asthma/copd, rhinosinusitis Since min Nov incr chest congestion, more productive cough, hoarse. didn't respond to doxy. Now has himself on pre 30 daily and has started his own sputum culture. Last time grew Moraxella. Denies fever or sore throat. a few headaches. Doubts sinus infection.  February 13, 2009- Chronic asthma/ COPD, rhinosinusitis Has had to be on 20-40 prednisone daily since November. No major infection. Cost is prohibitive now for Advair. We discussed Dulera trial since he says Symbicort twice caused chest tightness. Flu vax at work. Sinus congestion is within his usual range. Leg cramps while  in bed by end of day- says no probs on routine physical labs. No edema or limb jerks.  June 18, 2009- Chronic asthma/ COPD, rhinosinusitis He comes for acute visit reporting onset over past 3 days of lightly pleuritic pain in left mid chest with green, blood streaked sputum but no fever. He thinks this may be a recurrence of the moraxella. bronchopneumonia pattern that responded to augmentin x 14 days. Repsonse was slower and less clear- cut than in past. 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 Augmentin gave best zone size on the plate last time. No sinus problem or headache, but he keeps nasal congestion during bronchitis flares. He is currently on 15 mg prednisone daily.    Current Medications (verified): 1)  Fluticasone Propionate 50 Mcg/act Susp (Fluticasone Propionate) .... Spray 1 Spray Into Both  Nostrils Once A Day 2)  Ventolin Hfa 108 (90 Base) Mcg/act Aers (Albuterol Sulfate) .... 2 Puffs Four Times A Day As Needed Rescue 3)  Albuterol Sulfate 4 Mg  Xr12h-Tab (Albuterol  Sulfate) .Marland Kitchen.. 1 Twice Daily As Needed 4)  Prednisone 20 Mg Tabs (Prednisone) .Marland Kitchen.. 1 Daily or As Directed 5)  Clarithromycin 500 Mg Tabs (Clarithromycin) .Marland Kitchen.. 1 Twice Daily After Meals 6)  Albuterol Sulfate (2.5 Mg/37ml) 0.083%  Nebu (Albuterol Sulfate) .... Four Times Daily or Every 6 Hours As Needed 7)  Ipratropium Bromide 0.02 % Soln (Ipratropium Bromide) .Marland Kitchen.. 1 Vial in Hhn Four Times A Day As Needed 8)  Cefdinir 300 Mg Caps (Cefdinir) .... 2 Daily X 7 Days 9)  Dulera 200-5 Mcg/act Aero (Mometasone Furo-Formoterol Fum) .... 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  Medical lab tech  Risk Factors: Smoking Status: never (05/18/2007)  Review of Systems      See HPI       The patient complains of chest pain, dyspnea on exertion, and prolonged cough.  The patient denies anorexia, fever, weight loss, weight gain, vision loss, decreased hearing, hoarseness, syncope, peripheral edema, headaches, hemoptysis, abdominal pain, and severe indigestion/heartburn.    Vital  Signs:  Patient profile:   55 year old male Height:      68 inches Weight:      180.25 pounds BMI:     27.51 O2 Sat:      92 % on Room air Pulse rate:   75 / minute BP sitting:   110 / 72  (left arm) Cuff size:   regular  Vitals Entered By: Reynaldo Minium CMA (June 18, 2009 11:44 AM)  O2 Flow:  Room air  Physical Exam  Additional Exam:  General: A/Ox3; pleasant and cooperative, NAD, SKIN: no rash, lesions NODES: no lymphadenopathy HEENT: [place]/AT, EOM- WNL, Conjuctivae- clear, PERRLA, periorbital edema, TM-WNL, Nose- mucus bridging, stuffy, Throat-  NECK: Supple w/ fair ROM, JVD- none, normal carotid impulses w/o bruits Thyroid- CHEST: coarse wheeze bilaterally, unlabored, without rales or rub. HEART: RRR, no m/g/r heard ABDOMEN: Soft and nl; nml bowel sounds; no organomegaly or masses noted BJY:NWGN, nl pulses, no edema  NEURO: Grossly intact to observation      Impression & Recommendations:  Problem # 1:  C O P D (ICD-496) Acute bronchitis or bronchopneumonia, consistent with bacterial etiology. His plates will identify the organism soon. We discussed available tools. He does not tolerate avelox or erythromycin - GI complaints. He is unsure that avelox was a problem and he tolerates other quinolones. He agrees to try samples of Avelox. We considered CXR, but felt it wouldn't change treatment yet, unless he fails to respond to antibiotic. 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  Other Orders: Est. Patient Level II (56213)  Patient Instructions: 1)  Please schedule a follow-up appointment in 6 months. 2)  Try samples Avelox 400 mg, 1 daily x 7 days. We are interested in the effectiveness as well as your tolerance of this med.;

## 2010-04-09 NOTE — Progress Notes (Signed)
Summary: D-Dimer elevated  Phone Note Call from Patient Call back at [phone]   Caller: Patient Call For: young Reason for Call: Talk to Nurse Summary of Call: did fax results of d-dimer test  -  want to know what else to do. Initial call taken by: Eugene Gavia,  June 27, 2009 1:40 PM  Follow-up for Phone Call        Lab results howed positive D-Dimer.  Results given to Dr Maple Hudson and he is going to discuss with pt. Follow-up by: Vernie Murders,  June 27, 2009 2:26 PM  Additional Follow-up for Phone Call Additional follow up Details #1::        I explained positive test is nonspecific. He agrees to CT with contrast to r/o PE there at [facility]. We will order chem for renal function, CT. Additional Follow-up by: Waymon Budge MD,  June 27, 2009 2:34 PM    Additional Follow-up for Phone Call Additional follow up Details #2::    Order for CT with contrast and labs were faxed to [facility] de[facility] and they will set everything up and call us with results. Reynaldo Minium CMA  June 27, 2009 3:53 PM   Papers sent to Medical Records to have scanned in .Reynaldo Minium CMA  June 27, 2009 3:54 PM

## 2010-04-09 NOTE — Assessment & Plan Note (Signed)
Summary: SOB/ LUNG PAIN///KP   Primary Provider/Referring Provider:  [name]  CC:  Acute Visit.  Pt c/o increased SOB - worse with activity and dull pain in left side x 1 month.  Also would like to discuss CT Chest results..  History of Present Illness: .  02/15/08- Chronic asthma/copd, rhinosinusitis Since min Nov incr chest congestion, more productive cough, hoarse. didn't respond to doxy. Now has himself on pre 30 daily and has started his own sputum culture. Last time grew Moraxella. Denies fever or sore throat. a few headaches. Doubts sinus infection.  February 13, 2009- Chronic asthma/ COPD, rhinosinusitis Has had to be on 20-40 prednisone daily since November. No major infection. Cost is prohibitive now for Advair. We discussed Dulera trial since he says Symbicort twice caused chest tightness. Flu vax at work. Sinus congestion is within his usual range. Leg cramps while  in bed by end of day- says no probs on routine physical labs. No edema or limb jerks.  June 18, 2009- Chronic asthma/ COPD, rhinosinusitis He comes for acute visit reporting onset over past 3 days of lightly pleuritic pain in left mid chest with green, blood streaked sputum but no fever. He thinks this may be a recurrence of the moraxella. bronchopneumonia pattern that responded to augmentin x 14 days. Repsonse was slower and less clear- cut than in past. 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 Augmentin gave best zone size on the plate last time. No sinus problem or headache, but he keeps nasal congestion during bronchitis flares. He is currently on 15 mg prednisone daily.  June 28, 2009- Chrinoic asthma,/ COPD, rhinosinusitis...................Marland Kitchenwife here Sputum has cleared from brown to his usual yellow after 7 days Avelox finshed 5 days ago. Still a little more dyspneic and raspy through his chest than usual, without fever. Because of persistent pleuritic pain left anterolateral chest>>  D-dimer elevated, raising concern VTE. CT report reviewed with him- density posterior right lung. DDX includes mucus plug, pneumonia, ATX or less likely- a tumor. We will get the disc sent over.      Current Medications (verified): 1)  Fluticasone Propionate 50 Mcg/act Susp (Fluticasone Propionate) .... Spray 1 Spray Into Both  Nostrils Once A Day 2)  Ventolin Hfa 108 (90 Base) Mcg/act Aers (Albuterol Sulfate) .... 2 Puffs Four Times A Day As Needed Rescue 3)  Albuterol Sulfate 4 Mg  Xr12h-Tab (Albuterol Sulfate) .Marland Kitchen.. 1 Twice Daily As Needed 4)  Prednisone 20 Mg Tabs (Prednisone) .Marland Kitchen.. 1 Daily or As Directed 5)  Albuterol Sulfate (2.5 Mg/46ml) 0.083%  Nebu (Albuterol Sulfate) .... Four Times Daily or Every 6 Hours As Needed 6)  Ipratropium Bromide 0.02 % Soln (Ipratropium Bromide) .Marland Kitchen.. 1 Vial in Hhn Four Times A Day As Needed 7)  Dulera 200-5 Mcg/act Aero (Mometasone Furo-Formoterol Fum) .... 2 Pufs Twice A Day and Rinse After Each Use 8)  Calcium-Vitamin D 600-200 Mg-Unit Tabs (Calcium-Vitamin D) .... Two Times A Day 9)  Mucinex 600 Mg Xr12h-Tab (Guaifenesin) .... As Needed  Allergies: 1)  ! 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  The patient denies anorexia, fever, weight loss, weight gain, vision loss, decreased hearing, hoarseness, syncope, peripheral edema, headaches, hemoptysis, abdominal pain, and severe indigestion/heartburn.    Vital Signs:  Patient profile:   55 year old male Height:      68 inches Weight:      179.38 pounds BMI:     27.37 O2 Sat:      91 % on Room air Pulse rate:   79 / minute BP sitting:   118 / 76  (right arm) Cuff size:   regular  Vitals Entered By: Gweneth Dimitri RN (June 28, 2009 2:08 PM)  O2 Flow:  Room air CC: Acute Visit.  Pt c/o increased SOB - worse with activity and dull pain in left side x 1 month.  Also would like to discuss CT Chest results. Comments Medications reviewed with patient Daytime contact number verified with patient. Gweneth Dimitri RN  June 28, 2009 2:09 PM    Physical Exam  Additional Exam:  General: A/Ox3; pleasant and cooperative, NAD, SKIN: no rash, lesions NODES: no lymphadenopathy HEENT: [place]/AT, EOM- WNL, Conjuctivae- clear, PERRLA, periorbital edema, TM-WNL, Nose- mucus bridging, stuffy, Throat-  NECK: Supple w/ fair ROM, JVD- none, normal carotid impulses w/o bruits Thyroid- CHEST: coarse wheeze/ rhonchi bilaterally, unlabored, without rales or rub. HEART: RRR, no m/g/r heard ABDOMEN: Soft and nl; nml bowel sounds; no organomegaly or masses noted ZOX:WRUE, nl pulses, no edema  NEURO: Grossly intact to observation      Impression & Recommendations:  Problem # 1:  C O P D (ICD-496) Chronic asthmatic bronchitis pattern. Hx tracheostomy and RMLobectomy age 43. We will need to update PFT, not done since record computerized.  Problem # 2:  BRONCHITIS, ACUTE (ICD-466.0)  Pulmonary embolism was excluded. Residual chest wall soreness is probably muxculoskeletal.. We will leave him with standby antibiotic and prednsione taper for the long weekend The following medications were removed from the medication  list:    Clarithromycin 500 Mg Tabs (Clarithromycin) .Marland Kitchen... 1 twice daily after meals    Cefdinir 300 Mg Caps (Cefdinir) .Marland Kitchen... 2 daily x 7 days His updated medication list for this problem includes:    Ventolin Hfa 108 (90 Base) Mcg/act Aers (Albuterol sulfate) .Marland Kitchen... 2 puffs four times a day as needed rescue    Albuterol Sulfate 4 Mg Xr12h-tab (Albuterol sulfate) .Marland Kitchen... 1 twice daily as needed    Albuterol Sulfate (2.5 Mg/48ml) 0.083% Nebu (Albuterol sulfate) .Marland Kitchen... Four times daily or every 6 hours as needed    Ipratropium Bromide 0.02 % Soln (Ipratropium bromide) .Marland Kitchen... 1 vial in hhn four times a day as needed    Dulera 200-5 Mcg/act Aero (Mometasone furo-formoterol fum) .Marland Kitchen... 2 pufs twice a day and rinse after each use    Mucinex 600 Mg Xr12h-tab (Guaifenesin) .Marland Kitchen... As needed    Cefdinir 300 Mg Caps (Cefdinir) .Marland Kitchen... 2 daily  Problem # 3:  LUNG NODULE (ICD-518.89)  Right lung nodule to be followed, with ddx as above, discussed with him.  Medications Added to Medication List This Visit: 1)  Prednisone 5 Mg Tabs (Prednisone) .... 8 x 2 days, 6 x 2 days, 4 x 2 days then as  directed 2)  Calcium-vitamin D 600-200 Mg-unit Tabs (Calcium-vitamin d) .... Two times a day 3)  Mucinex 600 Mg Xr12h-tab (Guaifenesin) .... As needed 4)  Cefdinir 300 Mg Caps (Cefdinir) .... 2 daily  Other Orders: Est. Patient Level III (16109)  Patient Instructions: 1)  Please schedule a follow-up appointment in 3 months. 2)  Scripts to hold for prednisone and cefdinir Prescriptions: PREDNISONE 5 MG TABS (PREDNISONE) 8 x 2 days, 6 x 2 days, 4 x 2 days then as directed  #100 x 0   Entered and Authorized by:   Waymon Budge MD   Signed by:   Waymon Budge MD on 06/28/2009   Method used:   Print then Give to Patient   RxID:   [id number] CEFDINIR 300 MG CAPS (CEFDINIR) 2 daily  #14 x 0   Entered and Authorized by:   Waymon Budge MD   Signed by:   Waymon Budge MD on 06/28/2009   Method used:   Print then  Give to Patient   RxID:   [id number]

## 2010-04-09 NOTE — Progress Notes (Signed)
Summary: congested/ side pain  Phone Note Call from Patient Call back at [phone]   Caller: Patient Call For: young Summary of Call: pt completed antibiotic still have green congestion with pain in left side . Initial call taken by: Rickard Patience,  June 18, 2009 8:08 AM  Follow-up for Phone Call        Called, spoke with pt's wife Vernona Rieger.  Per Vernona Rieger, pt has had sinus infection and prod cough with green mucus on and off x1 month.  States pt has finished round of augmentin and doxy in this month but is still not better.  Also states pt is having pain in left side of chest.  Requesting pt to come in today.  OV offered with TP - Vernona Rieger declined stating CY "usually is good about working pt in."  [facility], tell pt to come in today at 11:15 to be worked in.  Pt's wife Vernona Rieger aware for pt to come in today at 11:15 to see CY.  Informed her if pt's pain becomes worse or pain with SOB to call 911.  She verbalized understanding.   Follow-up by: Gweneth Dimitri RN,  June 18, 2009 8:55 AM

## 2010-04-09 NOTE — Progress Notes (Signed)
Summary: SOB/ LUNG PAIN-Await for call back from pt on 06/27/09  Phone Note Call from Patient Call back at [facility] Phone [phone]   Caller: Patient Call For: YOUNG Summary of Call: pt c/o SOB/ lung pain. has finished taking his avelox.  i have scheduled an ov w/ dr young for this thurs, but pt wants to know if he will need to be seen or if another abx should be called in. [facility] [address].  Initial call taken by: Tivis Ringer, CNA,  June 26, 2009 3:11 PM  Follow-up for Phone Call        Pt states he has finished avelox. He still has productive cough but phlegm is now clear. He states that SOB and pain in left side is unchnaged, no better. He was schedueld for an appt this thursday, but pt questioning whether he needs to come be seen, or just needs more antibiotics. Please advise. Carron Curie CMA  June 26, 2009 3:22 PM allergies: avelox, erythromycin  Additional Follow-up for Phone Call Additional follow up Details #1::        I think this is musculoskeletal pain and don't think another antibiotic would help, unless he wants to try one.  I would like to suggest we get a D-dimer to exclude PE as basis for this persistent pleuritic pain. Additional Follow-up by: Waymon Budge MD,  June 26, 2009 5:24 PM  New Problems: ? of AC VENUS EMBO & THROMB UNSPEC DEEP VES LOWER EXT (ICD-453.40)   Additional Follow-up for Phone Call Additional follow up Details #2::    Spoke with pt and advised of the above recs per Dr Maple Hudson.  Dr Maple Hudson has written order to have D-Dimer drawn.   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  Order is in triage.  Will await call back. Vernie Murders  June 26, 2009 5:35 PM  Pt called with fax number for order the # is:[phone]  Darletta Moll  June 27, 2009 9:40  AM    faxed order to # above.  LMOM informing pt of this.  Aundra Millet Reynolds LPN  June 27, 2009 11:20 AM     New Problems: ? of AC VENUS EMBO & THROMB UNSPEC DEEP VES LOWER EXT (ICD-453.40)

## 2010-04-11 NOTE — Progress Notes (Signed)
Summary: xoliar question/cb  Phone Note Call from Patient Call back at [phone]   Caller: Patient Call For: young Summary of Call: where are we at on the [facility] paperwork Initial call taken by: Lacinda Axon,  February 14, 2010 12:33 PM  Follow-up for Phone Call        Bjorn Loser do you know anything about this pt xolair? Please advsie.Carron Curie CMA  February 14, 2010 12:53 PM  There hasn't been an order put in for xolair. I didn't know anything about it. Please ask [name] to put the order in and send it to me.  Thanks, Alfonso Ramus  February 14, 2010 1:08 PM   Additional Follow-up for Phone Call Additional follow up Details #1::        pt calling wanting to know the status of the xolair paperwork---please advise. thanks Randell Loop CMA  February 14, 2010 2:32 PM     Additional Follow-up for Phone Call Additional follow up Details #2::    OK Follow-up by: Waymon Budge MD,  February 15, 2010 8:55 PM  Additional Follow-up for Phone Call Additional follow up Details #3:: Details for Additional Follow-up Action Taken: Spoke with pt's wife. Pt already has an epi-pen. Will fax paperwork to start xolair process and will mail dvd to pt's address. Wife and patient aware. [name]  February 18, 2010 2:54 PM   New/Updated Medications: XOLAIR 150 MG SOLR (OMALIZUMAB) 150 mg Subcut every 4 weeks Prescriptions: XOLAIR 150 MG SOLR (OMALIZUMAB) 150 mg Subcut every 4 weeks  #150 mg x prn    Entered and Authorized by:   Waymon Budge MD   Signed by:   Waymon Budge MD on 02/15/2010   Method used:   Historical   RxID:   [id number]

## 2010-04-11 NOTE — Assessment & Plan Note (Signed)
Summary: [name] INJECTION/OK'D PER [name]/RJC   Primary Provider/Referring Provider:  Kirby [name]  CC:  pt here for 1st xolair inj; cough with production;clear wheezing all the time, sob with exertion, and Hypertension Management.  History of Present Illness: January 17, 2010-  Chronic obstructive asthma, rhinosinusitis, Abnormal Xray Currently doing better than last visit, but not sure why. He is taking SlowMag 1 tab. Put off retrying Daliresp because he has felt down in the dumps as he pulled prednisone down to 10 alt w/ 5 every other day. Not reallly sure why he is better. Daily still nasal congestion slighlty yellow. Cough is comfortably productive- slightly yellow. Sense of smell again left as he reduced prednisone, feeling a little stuffy. Right ear won't pop. Tried Xolair in past but stopped during serial sinus infections. We discussed current understanding of side effects. He willl check on opneumovax status  March 15, 2010- Chronic obstructive asthma, rhinosinusitis, Abnormal Xray Nurse-CC: Acute visit-wheezing,chest tightness/congestion x 3 days- no color in phelgm. Acute visit- Few days of increased tightness and wheeze. Feels congested but unable to cough productively. He has been on Daliresp now for 2 weeks, feeling a little heart burn. To start Xolair in a week. Has been rotating prednisone 5/10 for last days.  He is using Zyflo CR 2 two times a day in lieu of Singulair and needs LFT checked.   March 18, 2010  Chronic obstructive asthma/ Xolair, rhinosinusitis, Abnormal Xray..............Marland Kitchenwife here Nurse-CC: pt here for 1st xolair inj; cough with production;clear wheezing all the time, sob with exertion He didn't improve as much as he wished after treatment last here, so he increased his prednisone to 30 mg daily. He just finished his Daliresp samples. We discussed this and decided to let him try the 2 new meds- Xolair and Zyflo- for awhile off the Daliresp.       Hypertension History:      Positive major cardiovascular risk factors include male age 61 years old or older.  Negative major cardiovascular risk factors include non-tobacco-user status.    Preventive Screening-Counseling & Management  Alcohol-Tobacco     Smoking Status: never  Current Medications (verified): 1)  Fluticasone Propionate 50 Mcg/act Susp (Fluticasone Propionate) .... Spray 1 Spray Into Both  Nostrils Once A Day 2)  Ventolin Hfa 108 (90 Base) Mcg/act Aers (Albuterol Sulfate) .... 2 Puffs Four Times A Day As Needed Rescue 3)  Albuterol Sulfate 4 Mg  Xr12h-Tab (Albuterol Sulfate) .Marland Kitchen.. 1 Twice Daily As Needed 4)  Prednisone 10 Mg Tabs (Prednisone) .... Take 1/2 By Mouth Every Other Day and Then 1 By Mouth Every Other Day 5)  Albuterol Sulfate (2.5 Mg/76ml) 0.083%  Nebu (Albuterol Sulfate) .... Four Times Daily or Every 6 Hours As Needed 6)  Ipratropium Bromide 0.02 % Soln (Ipratropium Bromide) .Marland Kitchen.. 1 Vial in Hhn Four Times A Day As Needed 7)  Advair Diskus 250-50 Mcg/dose Aepb (Fluticasone-Salmeterol) .Marland Kitchen.. 1 Puff Two Times A Day and Rinse Mouth Well 8)  Calcium-Vitamin D 600-200 Mg-Unit Tabs (Calcium-Vitamin D) .... Two Times A Day 9)  Mucinex 600 Mg Xr12h-Tab (Guaifenesin) .... As Needed 10)  Daliresp 500 Mcg Tabs (Roflumilast) .Marland Kitchen.. 1 Daily With Food 11)  Zyflo Cr 600 Mg Xr12h-Tab (Zileuton) .... 2 Two Times A Day 12)  Prednisone 5 Mg Tabs (Prednisone) .... Take 40mg  Daily, Taper As Directed 13)  Xolair 150 Mg Solr (Omalizumab) .Marland KitchenMarland KitchenMarland Kitchen 150 Mg Subcut Every 4 Weeks 14)  Epipen 2-Pak 0.3 Mg/0.6ml Devi (Epinephrine) .... Use As  Directed For Severe Allergic Reaction As Needed  Allergies: 1)  ! Pcn 2)  ! 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  The patient denies productive cough, coughing up blood, chest pain, irregular heartbeats, acid heartburn, indigestion, loss of appetite, weight change, abdominal pain, difficulty swallowing, sore throat, tooth/dental problems, headaches, and sneezing.    Vital Signs:  Patient profile:   55 year old male Height:      68 inches Weight:      176.25 pounds BMI:     26.90 O2 Sat:      95 % on Room air Pulse rate:   89 / minute BP sitting:   110 / 60  (right arm) Cuff size:   regular  Vitals Entered By: Kandice Hams CMA (March 18, 2010 9:14 AM)  O2 Flow:  Room air CC: pt here for 1st xolair inj; cough with production;clear wheezing all the time, sob with exertion, Hypertension Management Comments pharmacy verified   Physical Exam  Additional Exam:  General: A/Ox3; pleasant and cooperative, NAD, SKIN: no rash, lesions NODES: no lymphadenopathy HEENT: [place]/AT, EOM- WNL, Conjuctivae- clear, PERRLA, increased  periorbital edema, TM-WNL, Nose- mucus bridging, stuffy, Throat-  NECK: Supple w/ fair ROM, JVD- 1+/ fill from below, normal carotid impulses w/o bruits Thyroid- CHEST: coarse rhonchi/ wheeze, unlabored HEART: RRR, no m/g/r heard ABDOMEN: Soft and nl;  EAV:WUJW, nl pulses, no edema  NEURO: Grossly intact to  observation      Impression & Recommendations:  Problem # 1:  ASTHMA (ICD-493.90) Chronic obstructive asthma. We discussed the goals of Eric [name] and measures such as reduced ER and prednisone needs. We are refilling prednisone and starting Xolair. He will stay off Daliresp for now.   Medications Added to Medication List This Visit: 1)  Prednisone 10 Mg Tabs (Prednisone) .... Burst and taper as needed  Other Orders: Est. Patient Level III (11914)  Hypertension Assessment/Plan:      The patient's hypertensive risk group is category B: At least one risk factor (excluding diabetes) with no target organ damage.  Today's blood pressure is 110/60.     Patient Instructions: 1)  Please schedule a follow-up appointment in 2 months. 2)  Lab today as ordered- for liver function check 3)  Ok to start Xolair today 4)  Rewritten script for prednisone Prescriptions: PREDNISONE 10 MG TABS (PREDNISONE) burst and taper as needed  #100 x 1   Entered and Authorized by:   Waymon Budge MD   Signed by:   Waymon Budge MD on 03/18/2010   Method used:   [facility]  [address] [id number]       [address]  [place], [place]  [address]       Ph: [phone] or [phone]       Fax: [phone]   RxID:   [id number]   Appended Document: Orders Update    Clinical Lists Changes  Problems: Added new problem of EXTRINSIC ASTHMA, UNSPECIFIED (ICD-493.00) Orders: Added new Service order of Administration xolair injection [id number]) - Signed       Medication Administration  Injection # 1:    Medication: Xolair (omalizumab) 150mg     Diagnosis: ASTHMA (ICD-493.90)    Route: SQ    Site: L deltoid    Exp Date: 03/2013    Lot #: [id number]    Mfr: Salome Spotted    Comments: 1.2 ml in left arm 150 MG charged 96401    Patient tolerated injection without complications    Given by: TAMMY SCOTT IN [facility]  Orders Added: 1)  Administration  xolair injection [47425]

## 2010-04-11 NOTE — Progress Notes (Signed)
Summary: status of FMLA papers  Phone Note Call from Patient Call back at [phone]   Caller: Patient Call For: young Summary of Call: Wants to know if CY received his FMLA papers from his insurance company. Initial call taken by: Darletta Moll,  March 26, 2010 10:26 AM  Follow-up for Phone Call        Pls advise if these FMLA papers have been addressed thanks Vernie Murders  March 26, 2010 11:39 AM  Follow-up by: Waymon Budge MD,  March 26, 2010 12:41 PM  Additional Follow-up for Phone Call Additional follow up Details #1::        I did this a week or two ago- [name] do you know where it went?? Additional Follow-up by: Waymon Budge MD,  March 26, 2010 12:41 PM    Additional Follow-up for Phone Call Additional follow up Details #2::    Papers had been faxed to company and sent to HIM to scan in EMR; I have called the patient and [facility] regarding this matter.Reynaldo Minium CMA  March 26, 2010 2:26 PM     Pt returned call today and spoke with Mindy-pt is aware that FMLA papers were faxed to company and requested they be faxed to [phone] fax number). I have sent the papers to be scanned in EMR. Pt is also aware of his labs.Reynaldo Minium CMA  April 01, 2010 12:15 PM

## 2010-04-11 NOTE — Assessment & Plan Note (Signed)
Summary: DEPO   Primary Provider/Referring Provider:  [name]  CC:  Acute visit-wheezing and chest tightness/congestion x 3 days- no color in phelgm..  History of Present Illness: January 17, 2010-  Chronic obstructive asthma, rhinosinusitis, Abnormal Xray Currently doing better than last visit, but not sure why. He is taking SlowMag 1 tab. Put off retrying Daliresp because he has felt down in the dumps as he pulled prednisone down to 10 alt w/ 5 every other day. Not reallly sure why he is better. Daily still nasal congestion slighlty yellow. Cough is comfortably productive- slightly yellow. Sense of smell again left as he reduced prednisone, feeling a little stuffy. Right ear won't pop. Tried Xolair in past but stopped during serial sinus infections. We discussed current understanding of side effects. He willl check on opneumovax status  March 15, 2010- Chronic obstructive asthma, rhinosinusitis, Abnormal Xray Nurse-CC: Acute visit-wheezing,chest tightness/congestion x 3 days- no color in phelgm. Acute visit- Few days of increased tightness and wheeze. Feels congested but unable to cough productively. He has been on Daliresp now for 2 weeks, feeling a little heart burn. To start Xolair in a week. Has been rotating prednisone 5/10 for last days.  He is using Zyflo CR 2 two times a day in lieu of Singulair and needs LFT checked.    Asthma History    Asthma Control Assessment:    Age range: 12+ years    Symptoms: throughout the day    Nighttime Awakenings: 0-2/month    Interferes w/ normal activity: some limitations    SABA use (not for EIB): several times per day    Asthma Control Assessment: Very Poorly Controlled   Preventive Screening-Counseling & Management  Alcohol-Tobacco     Smoking Status: never  Current Medications (verified): 1)  Fluticasone Propionate 50 Mcg/act Susp (Fluticasone Propionate) .... Spray 1 Spray Into Both  Nostrils Once A Day 2)  Ventolin Hfa 108  (90 Base) Mcg/act Aers (Albuterol Sulfate) .... 2 Puffs Four Times A Day As Needed Rescue 3)  Albuterol Sulfate 4 Mg  Xr12h-Tab (Albuterol Sulfate) .Marland Kitchen.. 1 Twice Daily As Needed 4)  Prednisone 10 Mg Tabs (Prednisone) .... Take 1/2 By Mouth Every Other Day and Then 1 By Mouth Every Other Day 5)  Albuterol Sulfate (2.5 Mg/61ml) 0.083%  Nebu (Albuterol Sulfate) .... Four Times Daily or Every 6 Hours As Needed 6)  Ipratropium Bromide 0.02 % Soln (Ipratropium Bromide) .Marland Kitchen.. 1 Vial in Hhn Four Times A Day As Needed 7)  Advair Diskus 250-50 Mcg/dose Aepb (Fluticasone-Salmeterol) .Marland Kitchen.. 1 Puff Two Times A Day and Rinse Mouth Well 8)  Calcium-Vitamin D 600-200 Mg-Unit Tabs (Calcium-Vitamin D) .... Two Times A Day 9)  Mucinex 600 Mg Xr12h-Tab (Guaifenesin) .... As Needed 10)  Metronidazole 250 Mg Tabs (Metronidazole) .Marland Kitchen.. 1 Three Times A Day 11)  Daliresp 500 Mcg Tabs (Roflumilast) .Marland Kitchen.. 1 Daily With Food 12)  Singulair 10 Mg Tabs (Montelukast Sodium) .Marland Kitchen.. 1 Daily 13)  Zyflo Cr 600 Mg Xr12h-Tab (Zileuton) .... 2 Two Times A Day 14)  Prednisone 5 Mg Tabs (Prednisone) .... Take 40mg  Daily, Taper As Directed 15)  Xolair 150 Mg Solr (Omalizumab) .Marland KitchenMarland KitchenMarland Kitchen 150 Mg Subcut Every 4 Weeks 16)  Epipen 2-Pak 0.3 Mg/0.59ml Devi (Epinephrine) .... Use As Directed For Severe Allergic Reaction As Needed  Allergies (verified): 1)  ! Pcn 2)  ! * Daliresp 3)  Erythromycin Ethylsuccinate  Past History:  Past Medical History: Last updated: 06/28/2009 SINUSITIS (ICD-473.9) ALLERGIC RHINITIS (ICD-477.9) C O P  D (ICD-496) ASTHMA (ICD-493.90) Lung nodule- 13x74mm posterior RLL CT 06/27/09  Past Surgical History: Last updated: 05/18/2007 Right middle lobectomy age 23 with tracheostomy Right carotid body removed (sympathectomy) for asthma many asthma hospitalizations ventilator dependent respiratory failure- asthma- 1989  Family History: Last updated: 05/18/2007 mother- lymphoepithelial cancer Father, brother- allergic  asthma MGF- testicular cancer  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  The patient denies productive cough, coughing up blood, chest pain, irregular heartbeats, acid heartburn, indigestion, loss of appetite, weight change, abdominal pain, difficulty swallowing, sore throat, tooth/dental problems, headaches, nasal congestion/difficulty breathing through nose, and sneezing.    Vital Signs:  Patient profile:   55 year old male Height:      68 inches Weight:      175.13 pounds BMI:     26.72 O2 Sat:      90 % on Room air Pulse rate:   80 / minute BP sitting:   108 / 64  (right arm) Cuff size:   regular  Vitals Entered By: Reynaldo Minium CMA (March 15, 2010 11:53 AM)  O2 Flow:  Room air CC: Acute visit-wheezing,chest tightness/congestion x 3 days- no color in phelgm.   Physical Exam  Additional Exam:  General: A/Ox3; pleasant and cooperative, NAD, SKIN: no rash, lesions NODES: no lymphadenopathy HEENT: [place]/AT, EOM- WNL, Conjuctivae- clear, PERRLA, increased  periorbital edema, TM-WNL, Nose- mucus bridging, stuffy, Throat-  NECK: Supple w/ fair ROM, JVD- 1+/ fill from below, normal carotid impulses w/o bruits Thyroid- CHEST: musical wheeze HEART: RRR, no m/g/r heard ABDOMEN: Soft and nl;  LKG:MWNU, nl pulses, no edema  NEURO: Grossly intact to observation      Impression & Recommendations:  Problem # 1:  BRONCHITIS, ACUTE (ICD-466.0)  Discussed options. We will give depo today, then have him hold maintenance at 10 mg daily prednisone, waatching to see how he does with addition of Xolair.  Need to plan LFT check on Zyflo. His updated medication list for this problem includes:    Ventolin Hfa 108 (90 Base) Mcg/act Aers  (Albuterol sulfate) .Marland Kitchen... 2 puffs four times a day as needed rescue    Albuterol Sulfate 4 Mg Xr12h-tab (Albuterol sulfate) .Marland Kitchen... 1 twice daily as needed    Albuterol Sulfate (2.5 Mg/40ml) 0.083% Nebu (Albuterol sulfate) .Marland Kitchen... Four times daily or every 6 hours as needed    Ipratropium Bromide 0.02 % Soln (Ipratropium bromide) .Marland Kitchen... 1 vial in hhn four times a day as needed    Advair Diskus 250-50 Mcg/dose Aepb (Fluticasone-salmeterol) .Marland Kitchen... 1 puff two times a day and rinse mouth well    Mucinex 600 Mg Xr12h-tab (Guaifenesin) .Marland Kitchen... As needed    Metronidazole 250 Mg Tabs (Metronidazole) .Marland Kitchen... 1 three times a day    Daliresp 500 Mcg Tabs (Roflumilast) .Marland Kitchen... 1 daily with food    Singulair 10 Mg Tabs (Montelukast sodium) .Marland Kitchen... 1 daily    Zyflo Cr 600 Mg Xr12h-tab (Zileuton) .Marland Kitchen... 2 two times a day    Xolair 150 Mg Solr (Omalizumab) .Marland KitchenMarland KitchenMarland KitchenMarland Kitchen 150 mg subcut every 4 weeks  Other Orders: Est. Patient Level III (27253) Depo- Medrol 80mg  (J1040) Admin of Therapeutic Inj  intramuscular or subcutaneous (66440) TLB-Hepatic/Liver Function Pnl (80076-HEPATIC)  Patient Instructions: 1)  Please  schedule a follow-up appointment in 3 months. 2)  Depo 80 3)  Prednisone maintnenance for now at 10 mg daily- you may need to go up a little abpove that for a few days till you feel better;. 4)  Start the Xolair as planned.     Medication Administration  Injection # 1:    Medication: Depo- Medrol 80mg     Diagnosis: BRONCHITIS, ACUTE (ICD-466.0)    Route: IM    Site: RUOQ gluteus    Exp Date: 09-2012    Lot #: [id number]    Mfr: Pharmacia    Patient tolerated injection without complications    Given by: Boone Master CNA/MA (March 15, 2010 12:43 PM)  Orders Added: 1)  Est. Patient Level III [91478] 2)  Depo- Medrol 80mg  [J1040] 3)  Admin of Therapeutic Inj  intramuscular or subcutaneous [96372] 4)  TLB-Hepatic/Liver Function Pnl [80076-HEPATIC]

## 2010-04-11 NOTE — Progress Notes (Signed)
Summary: tight chest/ req depo - ov today  Phone Note Call from Patient Call back at Home Phone [phone]   Caller: Spouse mrs Deiss Call For: YOUNG Summary of Call: pt c/o tight chest. wants to come in a get a depo.  Initial call taken by: Tivis Ringer, CNA,  March 15, 2010 9:02 AM  Follow-up for Phone Call        Please have patient come in today at 11am for 1115am appt with [name].Reynaldo Minium CMA  March 15, 2010 9:08 AM   PT informed of appt time. Abigail Miyamoto RN  March 15, 2010 9:15 AM

## 2010-04-11 NOTE — Progress Notes (Signed)
Summary: xolair shipment---Epipen to pharmacy  Phone Note From Pharmacy   Caller: ebony w/ [facility] Call For: young  Summary of Call: calling rhonda re: [employer]. call [phone] Initial call taken by: Tivis Ringer, CNA,  March 08, 2010 12:43 PM  Follow-up for Phone Call        Called and spoke with Shanda Bumps at [facility]. Xolair shipment to arrive on Thurs. 03/14/10. Will contact pt to be placed on Dr. Roxy Cedar schedule to recv. 1st xolair injection. [name]  March 12, 2010 8:52 AM  Appt scheduled for 1st xolair injection on Monday 03/18/10 at 8:45 with [name]. Appt ok'd by [name]. Pt is aware of appt and that he must wait 2 hours after 1st injection.   [facility] [address] Pt is aware that he must bring Epi pen with him to each xolair appt. Alfonso Ramus  March 13, 2010 10:16 AM   Additional Follow-up for Phone Call Additional follow up Details #1::        RX sent to pharmacy.Michel Bickers CMA  March 13, 2010 10:33 AM    New/Updated Medications: EPIPEN 2-PAK 0.3 MG/0.3ML DEVI (EPINEPHRINE) use as directed for severe allergic reaction as needed Prescriptions: EPIPEN 2-PAK 0.3 MG/0.3ML DEVI (EPINEPHRINE) use as directed for severe allergic reaction as needed  #1 x 1   Entered by:   Michel Bickers CMA   Authorized by:   Waymon Budge MD   Signed by:   Michel Bickers CMA on 03/13/2010   Method used:   [facility]  [address] [id number]       [address]       [place], [place]  [address]       Ph: [phone] or [phone]       Fax: [phone]   RxID:   [id number]

## 2010-04-11 NOTE — Letter (Signed)
Summary: [employer] for Xolair/[employer]  [employer] for Xolair/[employer]   Imported By: Sherian Rein 03/20/2010 13:07:20  _____________________________________________________________________  External Attachment:    Type:   Image     Comment:   External Document

## 2010-04-15 ENCOUNTER — Encounter: Payer: Self-pay | Admitting: Internal Medicine

## 2010-04-15 ENCOUNTER — Ambulatory Visit (INDEPENDENT_AMBULATORY_CARE_PROVIDER_SITE_OTHER): Payer: 59

## 2010-04-15 DIAGNOSIS — J45909 Unspecified asthma, uncomplicated: Secondary | ICD-10-CM

## 2010-04-17 NOTE — Letter (Signed)
Summary: FMLA forms/[employer]  FMLA forms/[employer]   Imported By: Sherian Rein 04/08/2010 11:31:51  _____________________________________________________________________  External Attachment:    Type:   Image     Comment:   External Document

## 2010-04-25 NOTE — Assessment & Plan Note (Signed)
Summary: Eric [name]  Nurse Visit   Allergies: 1)  ! Pcn 2)  ! * Daliresp 3)  Erythromycin Ethylsuccinate  Medication Administration  Injection # 1:    Medication: Xolair (omalizumab) 150mg     Diagnosis: EXTRINSIC ASTHMA, UNSPECIFIED (ICD-493.00)    Route: SQ    Site: R deltoid    Exp Date: 03/2013    Lot #: [id number]    Mfr: Salome Spotted    Comments: 1.2 ML IN RIGHT AND LEFT ARM 150MG  CHARGED [phone]    Given by: Drucie Opitz IN [facility]ERGY LAB  Orders Added: 1)  Administration xolair injection [id number]   Medication Administration  Injection # 1:    Medication: Xolair (omalizumab) 150mg     Diagnosis: EXTRINSIC ASTHMA, UNSPECIFIED (ICD-493.00)    Route: SQ    Site: R deltoid    Exp Date: 03/2013    Lot #: [id number]    Mfr: Salome Spotted    Comments: 1.2 ML IN RIGHT AND LEFT ARM 150MG  CHARGED 96401    Given by: Drucie Opitz IN [facility]GY LAB  Orders Added: 1)  Administration xolair injection [phone]

## 2010-05-20 ENCOUNTER — Encounter: Payer: Self-pay | Admitting: Internal Medicine

## 2010-05-20 ENCOUNTER — Ambulatory Visit: Payer: Self-pay

## 2010-05-20 ENCOUNTER — Ambulatory Visit (INDEPENDENT_AMBULATORY_CARE_PROVIDER_SITE_OTHER): Payer: 59 | Admitting: Internal Medicine

## 2010-05-20 DIAGNOSIS — J330 Polyp of nasal cavity: Secondary | ICD-10-CM

## 2010-05-20 DIAGNOSIS — J329 Chronic sinusitis, unspecified: Secondary | ICD-10-CM

## 2010-05-20 DIAGNOSIS — J45909 Unspecified asthma, uncomplicated: Secondary | ICD-10-CM

## 2010-05-20 DIAGNOSIS — J449 Chronic obstructive pulmonary disease, unspecified: Secondary | ICD-10-CM

## 2010-05-28 NOTE — Assessment & Plan Note (Signed)
Summary: 2 month rov   Primary Provider/Referring Provider:  [name]  CC:  2 month follow up visit-COPD; currently doing well; taking Prednisone 5mg  once daily.  History of Present Illness:  March 15, 2010- Chronic obstructive asthma, rhinosinusitis, Abnormal Xray Nurse-CC: Acute visit-wheezing,chest tightness/congestion x 3 days- no color in phelgm. Acute visit- Few days of increased tightness and wheeze. Feels congested but unable to cough productively. He has been on Daliresp now for 2 weeks, feeling a little heart burn. To start Xolair in a week. Has been rotating prednisone 5/10 for last days.  He is using Zyflo CR 2 two times a day in lieu of Singulair and needs LFT checked.   March 18, 2010  Chronic obstructive asthma/ Xolair, rhinosinusitis, Abnormal Xray..............Marland Kitchenwife here Nurse-CC: pt here for 1st xolair inj; cough with production;clear wheezing all the time, sob with exertion He didn't improve as much as he wished after treatment last here, so he increased his prednisone to 30 mg daily. He just finished his Daliresp samples. We discussed this and decided to let him try the 2 new meds- Xolair and Zyflo- for awhile off the Daliresp.   May 20, 2010- Chronic obstructive asthma/ Xolair, rhinosinusitis, Abnormal Xray Nurse-CC: 2 month follow up visit-COPD; currently doing well; taking Prednisone 5mg  once daily Started Xolair at last visit. He is maintaining on only 5 mg daily prednisone which is good for him. So far he feels very optimistic. Sensitive to weather fronts. Nasal stuffy but expected this time of year on such low prednisone levels. using saline spray. A little cough and tightness today at baseline. doesn't intrerfere with day to day. Used neb for first time recently in weeks- pollen.  Submandibular glands tend to swell in Spring  and he knows he has sialothiths.     Asthma History    Asthma Control Assessment:    Age range: 12+ years    Symptoms: >2  days/week    Nighttime Awakenings: 0-2/month    Interferes w/ normal activity: no limitations    SABA use (not for EIB): 0-2 days/week    Asthma Control Assessment: Not Well Controlled   Preventive Screening-Counseling & Management  Alcohol-Tobacco     Smoking Status: never  Current Medications (verified): 1)  Fluticasone Propionate 50 Mcg/act Susp (Fluticasone Propionate) .... Spray 1 Spray Into Both  Nostrils Once A Day 2)  Ventolin Hfa 108 (90 Base) Mcg/act Aers (Albuterol Sulfate) .... 2 Puffs Four Times A Day As Needed Rescue 3)  Albuterol Sulfate 4 Mg  Xr12h-Tab (Albuterol Sulfate) .Marland Kitchen.. 1 Twice Daily As Needed 4)  Prednisone 10 Mg Tabs (Prednisone) .... Burst and Taper As Needed 5)  Albuterol Sulfate (2.5 Mg/10ml) 0.083%  Nebu (Albuterol Sulfate) .... Four Times Daily or Every 6 Hours As Needed 6)  Ipratropium Bromide 0.02 % Soln (Ipratropium Bromide) .Marland Kitchen.. 1 Vial in Hhn Four Times A Day As Needed 7)  Advair Diskus 250-50 Mcg/dose Aepb (Fluticasone-Salmeterol) .Marland Kitchen.. 1 Puff Two Times A Day and Rinse Mouth Well 8)  Calcium-Vitamin D 600-200 Mg-Unit Tabs (Calcium-Vitamin D) .... Two Times A Day 9)  Mucinex 600 Mg Xr12h-Tab (Guaifenesin) .... As Needed 10)  Zyflo Cr 600 Mg Xr12h-Tab (Zileuton) .... 2 Two Times A Day 11)  Xolair 150 Mg Solr (Omalizumab) .Marland KitchenMarland KitchenMarland Kitchen 150 Mg Subcut Every 4 Weeks 12)  Epipen 2-Pak 0.3 Mg/0.50ml Devi (Epinephrine) .... Use As Directed For Severe Allergic Reaction As Needed  Allergies (verified): 1)  ! Pcn 2)  ! 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  The patient denies shortness of breath with activity, shortness of breath at rest, productive cough, coughing up blood, chest pain, irregular heartbeats, acid heartburn, indigestion, loss of appetite, weight change, abdominal pain, difficulty swallowing, sore throat, tooth/dental problems, and headaches.    Vital Signs:  Patient profile:   55 year old male Height:      68 inches Weight:      179.38 pounds BMI:     27.37 O2 Sat:      93 % on Room air Pulse rate:   83 / minute BP sitting:   116 / 74  (left arm) Cuff size:   regular  Vitals Entered By: Reynaldo Minium CMA (May 20, 2010 4:14 PM)  O2 Flow:  Room air CC: 2 month follow up visit-COPD; currently doing well; taking Prednisone 5mg  once daily   Physical Exam  Additional Exam:  General: A/Ox3; pleasant and cooperative, NAD, SKIN: no rash, lesions NODES: no lymphadenopathy HEENT: [place]/AT, EOM- WNL, Conjuctivae- clear, PERRLA, increased  periorbital edema, TM-WNL, Nose- Polyps right superior and middle, stuffy, Throat-  NECK: Supple w/ fair ROM, JVD- 1+/ fill from below, normal carotid impulses w/o bruits Thyroid- CHEST:endinspiratory opening squeeks, no cough, unlabored HEART: RRR, no m/g/r heard ABDOMEN: Soft and nl;  ZOX:WRUE, nl pulses, no edema  NEURO: Grossly intact to observation      Impression & Recommendations:  Problem # 1:  ASTHMA (ICD-493.90) Chronic obstructive asthma. Hopefully Sharion Settler will make a significant  difference. Discussed physiology and limitations of Xolair again.   Problem # 2:  RHINOSINUSITIS, RECURRENT (ICD-473.9) Chronic rhinitis. He notes sense of smell still gone. It usually returns only when on a lot of prednisone.   Problem # 3:  NASAL POLYP (ICD-471.0)  He has never noted problems with Aspirin. We will watch with interest how this behaves on Xolair. He is encouraged to continue nasal steroid. ENT option is open.   Other Orders: Administration xolair injection [phone]) Est. Patient Level III (81191)  Patient Instructions: 1)  Please schedule a follow-up appointment in 4 months. 2)  Continue nasal steroid spray to keep the polyp shrunk down, 3)  Continue Xolair      Medication Administration  Injection # 1:    Medication: Xolair (omalizumab) 150mg     Diagnosis: EXTRINSIC ASTHMA, UNSPECIFIED (ICD-493.00)    Route: SQ    Site: L deltoid    Exp Date: 06/2013    Lot #: [id number]    Mfr: Salome Spotted    Comments: 1.2 ML IN LEFT AND RIGHT ARM 150 MG CHARGED 96401    Given by: Dimas Millin IN [facility]  Orders Added: 1)  Administration xolair injection [96401] 2)  Est. Patient Level III [62130]

## 2010-05-30 ENCOUNTER — Inpatient Hospital Stay (HOSPITAL_COMMUNITY): Payer: 59

## 2010-05-30 ENCOUNTER — Inpatient Hospital Stay (HOSPITAL_COMMUNITY)
Admission: AD | Admit: 2010-05-30 | Discharge: 2010-06-03 | DRG: 156 | Disposition: A | Discharge status: Home / Self Care | Payer: 59 | Source: Ambulatory Visit | Attending: Otolaryngology | Admitting: Otolaryngology

## 2010-05-30 ENCOUNTER — Telehealth: Payer: Self-pay | Admitting: Internal Medicine

## 2010-05-30 DIAGNOSIS — IMO0002 Reserved for concepts with insufficient information to code with codable children: Secondary | ICD-10-CM

## 2010-05-30 DIAGNOSIS — K219 Gastro-esophageal reflux disease without esophagitis: Secondary | ICD-10-CM | POA: Diagnosis present

## 2010-05-30 DIAGNOSIS — K1120 Sialoadenitis, unspecified: Secondary | ICD-10-CM | POA: Insufficient documentation

## 2010-05-30 DIAGNOSIS — K115 Sialolithiasis: Secondary | ICD-10-CM | POA: Diagnosis present

## 2010-05-30 DIAGNOSIS — J309 Allergic rhinitis, unspecified: Secondary | ICD-10-CM | POA: Diagnosis present

## 2010-05-30 DIAGNOSIS — K113 Abscess of salivary gland: Principal | ICD-10-CM | POA: Diagnosis present

## 2010-05-30 DIAGNOSIS — J45909 Unspecified asthma, uncomplicated: Secondary | ICD-10-CM | POA: Diagnosis present

## 2010-05-30 LAB — COMPREHENSIVE METABOLIC PANEL
Albumin: 3.4 g/dL — ABNORMAL LOW (ref 3.5–5.2)
Alkaline Phosphatase: 65 U/L (ref 39–117)
BUN: 4 mg/dL — ABNORMAL LOW (ref 6–23)
Calcium: 9 mg/dL (ref 8.4–10.5)
Glucose, Bld: 108 mg/dL — ABNORMAL HIGH (ref 70–99)
Potassium: 4 mEq/L (ref 3.5–5.1)
Sodium: 137 mEq/L (ref 135–145)
Total Protein: 6.8 g/dL (ref 6.0–8.3)

## 2010-05-30 LAB — DIFFERENTIAL
Basophils Absolute: 0.1 10*3/uL (ref 0.0–0.1)
Eosinophils Absolute: 0.6 10*3/uL (ref 0.0–0.7)
Eosinophils Relative: 4 % (ref 0–5)
Neutrophils Relative %: 75 % (ref 43–77)

## 2010-05-30 LAB — CBC
MCHC: 33.8 g/dL (ref 30.0–36.0)
Platelets: 259 10*3/uL (ref 150–400)
RDW: 13 % (ref 11.5–15.5)
WBC: 14.5 10*3/uL — ABNORMAL HIGH (ref 4.0–10.5)

## 2010-05-30 LAB — GLUCOSE, CAPILLARY: Glucose-Capillary: 154 mg/dL — ABNORMAL HIGH (ref 70–99)

## 2010-05-30 MED ORDER — IOHEXOL 300 MG/ML  SOLN
100.0000 mL | Freq: Once | INTRAMUSCULAR | Status: AC | PRN
  Administered 2010-05-30: 100 mL via INTRAVENOUS

## 2010-05-30 MED ORDER — IOHEXOL 300 MG/ML  SOLN: 100.0000 mL | Freq: Once | INTRAMUSCULAR | Status: AC | PRN

## 2010-05-30 NOTE — Telephone Encounter (Signed)
Error/dup message ° °

## 2010-05-30 NOTE — Telephone Encounter (Signed)
Called and informed Vernona Rieger I will send this to [name] as an Lorain Childes.  [place], [place]

## 2010-05-30 NOTE — Telephone Encounter (Signed)
Noted added to problem list.

## 2010-05-31 LAB — MRSA PCR SCREENING: MRSA by PCR: NEGATIVE

## 2010-05-31 LAB — SURGICAL PCR SCREEN: MRSA, PCR: NEGATIVE

## 2010-06-03 LAB — BASIC METABOLIC PANEL
BUN: 3 mg/dL — ABNORMAL LOW (ref 6–23)
CO2: 30 mEq/L (ref 19–32)
Calcium: 8.5 mg/dL (ref 8.4–10.5)
Chloride: 105 mEq/L (ref 96–112)
Creatinine, Ser: 0.83 mg/dL (ref 0.4–1.5)
GFR calc Af Amer: >60 mL/min (ref >60)
GFR calc non Af Amer: >60 mL/min (ref >60)
Glucose, Bld: 111 mg/dL — ABNORMAL HIGH (ref 70–99)
Potassium: 3.8 mEq/L (ref 3.5–5.1)
Sodium: 139 mEq/L (ref 135–145)

## 2010-06-03 LAB — DIFFERENTIAL
Basophils Absolute: 0.1 10*3/uL (ref 0.0–0.1)
Basophils Relative: 2 % — ABNORMAL HIGH (ref 0–1)
Lymphocytes Relative: 23 % (ref 12–46)
Neutro Abs: 2.8 10*3/uL (ref 1.7–7.7)
Neutrophils Relative %: 39 % — ABNORMAL LOW (ref 43–77)

## 2010-06-03 LAB — CBC
HCT: 39.4 % (ref 39.0–52.0)
Hemoglobin: 13.1 g/dL (ref 13.0–17.0)
MCHC: 33.2 g/dL (ref 30.0–36.0)
RBC: 4.24 MIL/uL (ref 4.22–5.81)
WBC: 7.2 10*3/uL (ref 4.0–10.5)

## 2010-06-03 NOTE — Op Note (Signed)
  NAME:  Eric [name], Eric [name]             ACCOUNT NO.:  [id number]  MEDICAL RECORD NO.:  [id number]           PATIENT TYPE:  I  LOCATION:  5529                         FACILITY:  [facility]  PHYSICIAN:  Lener Ventresca H. Pollyann Kennedy, MD     DATE OF BIRTH:  07/21/1955  DATE OF PROCEDURE:  05/31/2010 DATE OF DISCHARGE:                              OPERATIVE REPORT   PREOPERATIVE DIAGNOSIS:  Right submandibular sialolithiasis and sialadenitis.  POSTOPERATIVE DIAGNOSIS:  Right submandibular sialolithiasis and sialadenitis.  PROCEDURE:  Intraoral incision and drainage of submandibular abscess, attempted stone removal, but unsuccessful.  ANESTHESIA:  General endotracheal anesthesia was used.  COMPLICATIONS:  No complications.  BLOOD LOSS:  Minimal.  FINDINGS:  Very swollen right submandibular gland, firm and indurated, and pus filled abscess cavity deep within the submandibular duct with significant surrounding inflammatory changes and no identifiable calculus.  HISTORY:  A 55 year old was admitted from the office yesterday with worsening submandibular swelling and known sialolithiasis.  Risks, benefits, alternatives, complications of procedure were explained to the patient, seemed to understand and agreed to surgery.  PROCEDURE IN DETAIL:  The patient was taken to the operating room, placed on the operating table in supine position.  Following induction of general endotracheal anesthesia, the patient was prepped and draped in standard fashion.  A left bite block was used to keep the teeth apart.  The right floor of mouth was inspected and a large duct opening was identified from previous surgery, was probed with a #4 lacrimal probe.  At the depth of the probing, some small amount of pus was obtained.  With a probe in place, 1% Xylocaine with epinephrine was infiltrated along the length of the probe in all directions.  The floor of the mouth was then incised down to the probe as far as I could see. A  nasal speculum was then used to further inspect the depth of this wound.  The wound was probed with a hemostat and a large amount of thick foul-smelling pus was obtained.  Aerobic and anaerobic cultures were sent.  Additional probing of this area failed to reveal a distinct calculus, although there was severe surrounding inflammatory changes making dissection on this area somewhat hazardous, given the location of the cranial nerves and vessels.  The wound was packed with moist gauze, while the patient was then awakened and extubated and then the packing was removed.  He was transferred to recovery in stable condition.     Lucky Trotta H. Pollyann Kennedy, MD     JHR/MEDQ  D:  05/31/2010  T:  06/01/2010  Job:  [id number]  Electronically Signed by Serena Colonel MD on 06/03/2010 10:07:53 PM

## 2010-06-05 LAB — CULTURE, ROUTINE-ABSCESS: Gram Stain: NONE SEEN

## 2010-06-05 LAB — ANAEROBIC CULTURE

## 2010-06-07 NOTE — Discharge Summary (Signed)
NAME:  Eric [name], [name]:  [id number]  MEDICAL RECORD NO.:  [id number]           PATIENT TYPE:  I  LOCATION:  5529                         FACILITY:  [facility]  PHYSICIAN:  Jelesa Mangini T. Lazarus Salines, M.D. DATE OF BIRTH:  August 16, 1955  DATE OF ADMISSION:  05/30/2010 DATE OF DISCHARGE:  06/03/2010                              DISCHARGE SUMMARY   CHIEF COMPLAINT:  Neck swelling.  HISTORY OF PRESENT ILLNESS:  A 55 year old white male with a known history of bilateral submandibular sialolithiasis, status post at least one procedure for retrieval of floor of the mouth stone in the past has been advised to have his glands removed and thus far has declined.  He does have intermittent obstructive symptoms but has had no major infections.  Beginning approximately 2 days prior to admission, he had a worse than usual episode which progressed to include much more impressive swelling and pain both in the floor of his mouth and in his upper neck.  He was evaluated by Dr. Pollyann Kennedy prior to admission and felt to need intravenous antibiotics and possible exploration for stone removal.  He takes low-dose prednisone for his asthma.  He has been on oral antibiotics which have not been sufficient to resolve his issues. He is having some low-grade, but undocumented fevers.  PAST MEDICAL HISTORY: 1. Environmental allergies. 2. Reflux. 3. Asthma.  CURRENT MEDICATIONS: 1. Advair. 2. Albuterol. 3. Calcium supplements. 4. Prednisone 5 mg daily. 5. Zyrtec.  DRUG ALLERGIES:  Include PENICILLIN AND ERYTHROMYCIN.  SOCIAL HISTORY:  Noncontributory.  FAMILY HISTORY:  Noncontributory.  REVIEW OF SYSTEMS:  Negative except for mouth and neck swelling and pain.  PHYSICAL EXAMINATION:  Temperature 98.6, blood pressure 100/60.  He is distressed appearing.  He has large tender swelling of the right floor of mouth and submandibular gland.  Heart, abdomen, lungs, extremities and neurologic were all  within normal limits.  ADMISSION DIAGNOSES: 1. Bilateral submandibular sialolithiasis. 2. Right submandibular sialoadenitis with possible abscess.  HOSPITAL COURSE:  He was admitted to the hospital and had a CT scan performed which showed 2 stones in the substance of the left submandibular gland, 3 stones in the substance of the right submandibular gland as well some lucent areas around the gland and in the floor of the mouth consistent with possible abscess formation. White blood cell count on admission was 14.5000.  Following morning, he was taken to the operating room after having received intravenous clindamycin and had exploration of the right floor of the mouth.  No stone could be retrieved, but frank foul smelling pus was evacuated. The patient tolerated the procedure nicely.  Postoperatively, he had very significant pain and swelling including temperature 100.4 on the first postoperative day.  The second postoperative day, he was turning the corner with less swelling and able to tolerate p.o. liquids.  By the third postoperative day, he was afebrile.  White blood cell count 7.2000 and tolerating p.o. soft solids.  Cultures are not yet available.  He is tolerating clindamycin nicely.  He is suitable and agrees to home discharge with antibiotics as they are used in the hospital to be adjusted when  culture specific.  FINAL DIAGNOSES: 1. Right submandibular sialoadenitis. 2. Bilateral submandibular sialolithiasis. 3. Asthma.  PROCEDURE PERFORMED:  Incision and drainage (intraoral) right submandibular abscess, on May 31, 2010.  COMPLICATIONS:  None.  CONDITION ON DISCHARGE:  Ambulatory.  Pain control.  Taking good p.o. diet.  Asthma, stable.  Return visit 4 days.  Prescriptions, clindamycin 300 mg q.i.d. and Vicodin 5 mg one to two p.o. q.4 h p.r.n. pain. Written instructions were explained and given to the patient.  We will call him with the culture results when  available.     Eric [name]. Lazarus Salines, M.D.     KTW/MEDQ  D:  06/03/2010  T:  06/04/2010  Job:  [id number]  cc:   Thora Lance, M.D.  Electronically Signed by Flo Shanks M.D. on 06/07/2010 08:47:12 AM

## 2010-06-10 ENCOUNTER — Telehealth: Payer: Self-pay | Admitting: Internal Medicine

## 2010-06-10 NOTE — Telephone Encounter (Signed)
lmomtcb x1 

## 2010-06-10 NOTE — Telephone Encounter (Signed)
Mr. Eric [name] phoned stated that he was returning a call to Triage he can be reached at .Vedia Coffer [phone]

## 2010-06-10 NOTE — Telephone Encounter (Signed)
Spoke with pt and he states he currently gets his xolair once a month. Pt is wanting to know if he can get it every 3 weeks instead bc he has been noticing his symptoms returning sooner. Pt states within 2-3 weeks of getting the xolair injection his wheezing, chest tightness, and cough returns. Please advise [name]. Thanks

## 2010-06-11 ENCOUNTER — Encounter: Payer: Self-pay | Admitting: Internal Medicine

## 2010-06-11 ENCOUNTER — Ambulatory Visit (INDEPENDENT_AMBULATORY_CARE_PROVIDER_SITE_OTHER): Payer: 59 | Admitting: Internal Medicine

## 2010-06-11 VITALS — BP 118/76 | HR 87 | Ht 68.0 in | Wt 169.6 lb

## 2010-06-11 DIAGNOSIS — J45909 Unspecified asthma, uncomplicated: Secondary | ICD-10-CM

## 2010-06-11 DIAGNOSIS — K1120 Sialoadenitis, unspecified: Secondary | ICD-10-CM

## 2010-06-11 MED ORDER — METHYLPREDNISOLONE ACETATE 80 MG/ML IJ SUSP
80.0000 mg | Freq: Once | INTRAMUSCULAR | Status: AC
  Administered 2010-06-11: 80 mg via INTRAMUSCULAR

## 2010-06-11 MED ORDER — OMALIZUMAB 150 MG ~~LOC~~ SOLR: 300.0000 mg | SUBCUTANEOUS | Status: DC

## 2010-06-11 NOTE — Progress Notes (Signed)
  Subjective:    Patient ID: Eric [name], male    DOB: 03-31-1955, 55 y.o.   MRN: [id number]  HPI 55 yo never smoker with hx chronic obstructive asthma, now on Xolair. Xolair is being dosed 150 mg per month, but he feels it wearing off by 3 weeks and asks about a dosing change. He remains on prednisone 5 mg daily. At 3 weeks after a xolair dose he begins to feel tighter, more wheeze. He had to miss work today because of asthma and he increased prednisone today to 20 mg. . He denies obvious change with seasonal pollens- no itch, sneeze,watery eyes.    Review of Systems See HPI Constitutional:   No weight loss, night sweats,  Fevers, chills, fatigue, lassitude. HEENT:   No headaches,  Difficulty swallowing,  Tooth/dental problems,  Sore throat,                No sneezing, itching, ear ache, nasal congestion, post nasal drip,   CV:  No chest pain,  Orthopnea, PND, swelling in lower extremities, anasarca, dizziness, palpitations  GI  No heartburn, indigestion, abdominal pain, nausea, vomiting, diarrhea, change in bowel habits, loss of appetite  Resp: t.  No excess mucus, no productive cough,  No non-productive cough,  No coughing up of blood.  No change in color of mucus.  No wheezing. Skin: no rash or lesions.  GU: no dysuria, change in color of urine, no urgency or frequency.  No flank pain.  MS:  No joint pain or swelling.  No decreased range of motion.  No back pain.  Psych:  No change in mood or affect. No depression or anxiety.  No memory loss.     Objective:   Physical Exam General- Alert, Oriented, Affect-appropriate, Distress- none acute  wdwn  Skin- rash-none, lesions- none, excoriation- none  Lymphadenopathy- none  Head- atraumatic  Eyes- Gross vision intact, PERRLA, conjunctivae clear, secretions  Ears- Normal- Hearing, canals, Tm ,R ,  Nose- Clear,  No- Septal dev, mucus, polyps, erosion, perforation   Throat- Mallampati II , mucosa clear , drainage- none,  tonsils- atrophic  Neck- flexible , trachea midline, no stridor , thyroid nl, carotid no bruit  Chest - symmetrical excursion , unlabored     Heart/CV- RRR , no murmur , no gallop  , no rub, nl s1 s2                     - JVD- none , edema- none, stasis changes- none, varices- none     Lung- bilateral wheeze, cough- none , dullness-none, rub- none     Chest wall-   Abd- tender-no, distended-no, bowel sounds-present, HSM- no  Br/ Gen/ Rectal- Not done, not indicated  Extrem- cyanosis- none, clubbing, none, atrophy- none, strength- nl  Neuro- grossly intact to observation        Assessment & Plan:

## 2010-06-11 NOTE — Assessment & Plan Note (Signed)
Severe chronic obstructive asthma. We will need to give depo today, then have him increase prednisone for a few days till controlled.  We will apply to increase Xolair to 300 mg / month. Xolair clearly helps and will be more steroid sparing when we find optimal dosing.

## 2010-06-11 NOTE — Patient Instructions (Signed)
Depo 80  Hold prednisone at 20-30 mg daily till you see improvement, then taper as tolerated back towards 5 mg daily  We will apply to increase your Xolair to 300 mg per month/

## 2010-06-11 NOTE — Telephone Encounter (Signed)
Per [name]-pt will need to be seen to assess the situation with Xolair needs; I have scheduled patient to come in today at 215pm with [name] as pt was out of work today.Eric [name]

## 2010-06-16 ENCOUNTER — Encounter: Payer: Self-pay | Admitting: Internal Medicine

## 2010-06-16 NOTE — Assessment & Plan Note (Signed)
Dr Lazarus Salines is treating for salivary gland infection due to retained stones and staged surgery is being done for resection.

## 2010-06-20 ENCOUNTER — Ambulatory Visit: Payer: 59

## 2010-06-21 ENCOUNTER — Telehealth: Payer: Self-pay | Admitting: Internal Medicine

## 2010-06-21 NOTE — Telephone Encounter (Signed)
Duplicate message. 

## 2010-06-24 ENCOUNTER — Ambulatory Visit (INDEPENDENT_AMBULATORY_CARE_PROVIDER_SITE_OTHER): Payer: 59

## 2010-06-24 ENCOUNTER — Telehealth: Payer: Self-pay | Admitting: Internal Medicine

## 2010-06-24 DIAGNOSIS — J45909 Unspecified asthma, uncomplicated: Secondary | ICD-10-CM

## 2010-06-24 MED ORDER — OMALIZUMAB 150 MG ~~LOC~~ SOLR
150.0000 mg | Freq: Once | SUBCUTANEOUS | Status: AC
  Administered 2010-06-24: 150 mg via SUBCUTANEOUS

## 2010-06-24 NOTE — Telephone Encounter (Signed)
Spouse states that pt had been getting his Xolair through [facility] but during the year this was switched to [facility]. Also, dosage has increased from 150 mg to 300 mg. Bjorn Loser, will pt need to sign any paperwork or re-enroll? Pls advise.

## 2010-06-25 NOTE — Telephone Encounter (Signed)
Pt's Eric [name] is now handled (per pt) by [facility]. [facility] is aware and we are in the process of obtaining xolair from this point on through [facility]. Rx for new dosage (300 mg once a month) has been faxed. Pt is aware. [name]

## 2010-07-02 ENCOUNTER — Encounter (HOSPITAL_COMMUNITY)
Admission: RE | Admit: 2010-07-02 | Discharge: 2010-07-02 | Disposition: A | Discharge status: Home / Self Care | Payer: 59 | Source: Ambulatory Visit | Attending: Otolaryngology | Admitting: Otolaryngology

## 2010-07-02 LAB — BASIC METABOLIC PANEL
CO2: 28 mEq/L (ref 19–32)
Chloride: 105 mEq/L (ref 96–112)
Creatinine, Ser: 0.83 mg/dL (ref 0.4–1.5)
GFR calc Af Amer: >60 mL/min (ref >60)
Glucose, Bld: 102 mg/dL — ABNORMAL HIGH (ref 70–99)

## 2010-07-02 LAB — CBC
HCT: 45.1 % (ref 39.0–52.0)
Hemoglobin: 15.1 g/dL (ref 13.0–17.0)
MCH: 31.9 pg (ref 26.0–34.0)
RBC: 4.74 MIL/uL (ref 4.22–5.81)

## 2010-07-02 LAB — SURGICAL PCR SCREEN: MRSA, PCR: NEGATIVE

## 2010-07-04 ENCOUNTER — Inpatient Hospital Stay (HOSPITAL_COMMUNITY)
Admission: RE | Admit: 2010-07-04 | Discharge: 2010-07-05 | DRG: 139 | Disposition: A | Discharge status: Home / Self Care | Payer: 59 | Source: Ambulatory Visit | Attending: Otolaryngology | Admitting: Otolaryngology

## 2010-07-04 ENCOUNTER — Other Ambulatory Visit: Payer: Self-pay | Admitting: Otolaryngology

## 2010-07-04 DIAGNOSIS — Z79899 Other long term (current) drug therapy: Secondary | ICD-10-CM

## 2010-07-04 DIAGNOSIS — IMO0002 Reserved for concepts with insufficient information to code with codable children: Secondary | ICD-10-CM

## 2010-07-04 DIAGNOSIS — J45909 Unspecified asthma, uncomplicated: Secondary | ICD-10-CM | POA: Diagnosis present

## 2010-07-04 DIAGNOSIS — Z01812 Encounter for preprocedural laboratory examination: Secondary | ICD-10-CM

## 2010-07-04 DIAGNOSIS — K115 Sialolithiasis: Principal | ICD-10-CM | POA: Diagnosis present

## 2010-07-05 NOTE — Op Note (Signed)
NAME:  Eric [name], Eric [name] NO.:  [id number]  MEDICAL RECORD NO.:  [id number]           PATIENT TYPE:  I  LOCATION:  5118                         FACILITY:  [facility]  PHYSICIAN:  Zola Button T. Lazarus Salines, M.D. DATE OF BIRTH:  [birth year]  DATE OF PROCEDURE:  07/04/2010 DATE OF DISCHARGE:                              OPERATIVE REPORT   PREOPERATIVE DIAGNOSIS:  Bilateral submandibular sialolithiasis, status post right submandibular sialadenitis.  POSTOPERATIVE DIAGNOSIS:  Bilateral submandibular sialolithiasis, status post right submandibular sialadenitis.  PROCEDURE PERFORMED:  Bilateral submandibular gland excision.  SURGEON:  [name]. Levaeh Vice, MD  ANESTHESIA:  General orotracheal.  BLOOD LOSS:  Minimal.  COMPLICATIONS:  None.  FINDINGS:  An enlarged firm right submandibular gland with 2 palpable stones contained within the gland and an additional firm area in the floor of the mouth consistent with a third radiologically visualized stone.  Significant fibrosis in the superior and anterior medial aspect of the dissection consistent with prior intraaural exploration for abscess and infection.  On the left side, 2 palpable stones within a somewhat enlarged gland, but minimal fibrosis, relatively intact anatomy.  On the left side, lingual and hypoglossal nerves were easily identified and preserved.  On the right side, hypoglossal nerve was easily identified.  Lingual nerve was not clearly seen at any point during the procedure.  There was possible communication from a large surgical exploration site in the right floor of the mouth into the submandibular triangle dissection area at the site of the retained stone.  PROCEDURE:  With the patient in a comfortable supine position, general orotracheal anesthesia was induced without difficulty.  At an appropriate level, the patient was placed in a slight sitting position and a shoulder roll was placed and the neck extended.   Nerve integrity monitor was placed against the corner of the mouth on each side in the standard fashion.  Using headlight, the floor of the mouth was explored and a Q-tip was placed down into the surgically enlarged duct in the right floor of the mouth and allowed to remain in position for later identification during dissection.  The head was rotated to the left for access to the right neck.  A sterile preparation and draping of the neck on both sides was accomplished in the standard fashion.  Just below the palpable edge of the right submandibular gland, a 6-cm incision was planned in a relaxed skin tension line and then executed through skin and subcutaneous fat sharply.  The platysma muscle was divided with cautery.  A superior subplatysmal flap was raised for a short distance until the inferior aspect of the gland was identified. Dissection right down onto the capsule of gland was performed at the interior aspect of the gland and the remainder of the dissection was carried in a subcapsular plane.  This was worked posteriorly and laterally and then more deeply inferiorly and finally around the anterior corner.  Branches of the facial artery and vein were controlled at the posterior aspect of the gland using silk ligature.  The digastric tendon was identified.  The gland was bluntly dissected upward from above the digastric tendon to prevent  the injury to the hypoglossal nerve.  Small branches of presumed ranine veins were identified and controlled with silk ligature.  Working forward, there was heavy fibrosis as was noted superiorly and then more medially.  The mylohyoid muscle was identified and dissection was carried out under the free edge of the muscle.  The lingual nerve was never clearly identified, but in this region, dissection was carried out directly on the capsule of the gland and the tissues were dissected away from the gland and ligated as necessary.  Finally, the gland  remained pedicled by the tissue extending deep to the mylohyoid muscle.  Additional blunt dissection revealed what was thought to be the duct.  This was partially incised and flow of saliva was identified.  This was further dissected and then isolated between hemostats, divided, and controlled with silk ligature.  Some additional fibrous tissue was lysed and the gland was delivered.  From the CT scans, 3 stones were expected in this right submandibular gland. The gland was sectioned and only 2 stones were identified.  By digital palpation from the floor of mouth and the neck revealed an additional stone anterior to the dissection in the floor of mouth.  Gown and gloves were changed to maintain a sterile approach.  From the neck approach, this area was bluntly dissected and finally sharply dissected directly down onto the stone.  Attempts were made to grasp the stone through a small opening and the stone was fragmented.  The opening was enlarged and finally the stone was delivered piecemeal.  It was approximately 2 cm in its greatest extent.  The cavity of the stone had been in place, was thoroughly irrigated, and all stone fragments were carefully removed from the cavity and also from the neck wound.  There was some apparent salivary flow from the mouth at this point.  There was no reasonable way to close this cavity.  The wound was irrigated once again.  Valsalva failed to reveal any significant bleeding sites.  A separate opening was made posterior to the neck incision and a 10- French perforated drain was passed from the neck into the wound.  It was secured at the skin with a 3-0 Ethilon stitch.  The platysma layer was reapproximated using 4-0 chromic stitches.  Finally, the neck skin was closed in a cosmetic fashion using interrupted staples.  The drain was observed to function well and there was no significant bleeding.  At no point during the dissection was any activation of the  ramus mandibularis noted.  After completing the right submandibular resection, the head was rotated to the right for access to the left neck.  Once again, the neck was carefully palpated.  A 6-cm incision was planned and sharply executed and carried down through skin, subcutaneous fat, and platysma muscle. Cautery was used for hemostasis.  The nerve branch felt to be consistent with ramus mandibularis was identified and protected, although there was no activation of the nerve monitor or of the corner of the mouth.  This was retracted posteriorly and superiorly.  Once again, the capsule of the gland was identified and opened after raising minimal subplatysmal flaps.  Dissection was carried out in a subcapsular fashion on the lateral surface of the gland inferiorly, posteriorly and then brought around the deep medial surface.  Branches of the facial artery and vein were controlled with silk ligatures.  The lingual nerve was identified and dissected and the submandibular ganglion was controlled with a silk ligature.  Hypoglossal nerve  was identified and protected.  The dissection was carried forward beneath the mylohyoid muscle.  The submandibular duct was identified and ligated.  Additional fatty tissue and muscular tissue was lysed and the specimen was delivered.  On palpation, 2 stones, as anticipated by CT scan, were identified within the substance of the gland.  At this point, the dissection was completed.  Hypoglossal and lingual nerves were identified and intact. Hemostasis was observed.  Valsalva was used and no further bleeding was noted.  The wound was once again irrigated.  A 10-French round drain was placed in the standard fashion and secured to the skin with a 3-0 Ethilon stitch.  The wound was closed with interrupted 4-0 chromic at the platysma layer and then skin staples in a cosmetic fashion.  Both drains at this point were functioning.  This completed the procedure. The  patient was returned to Anesthesia, awakened, extubated, and transferred to recovery in stable condition.  In recovery, the patient had subjectively intact lingual nerve function on both sides and visibly intact hypoglossal function on both sides.  COMMENT:  A 55 year old white male with a several-year history of known submandibular stones bilateral, had an episode with infection and floor of mouth abscess formation roughly 6 weeks ago necessitating hospitalization and incision and drainage.  He returns today for removal of both glands to prevent for future recurrences of infection.  Anticipated routine postoperative recovery with attention to ice, elevation, analgesia, and antibiosis.  We will remove the suction drains in 24 hours and discharge him to his home in the care of his family.     [name]. Lazarus Salines, M.D.     KTW/MEDQ  D:  07/04/2010  T:  07/05/2010  Job:  [id number]  Electronically Signed by Flo Shanks M.D. on 07/05/2010 05:17:59 PM

## 2010-07-08 ENCOUNTER — Inpatient Hospital Stay (HOSPITAL_COMMUNITY)
Admission: AD | Admit: 2010-07-08 | Discharge: 2010-07-10 | DRG: 603 | Disposition: A | Discharge status: Home / Self Care | Payer: 59 | Source: Ambulatory Visit | Attending: Otolaryngology | Admitting: Otolaryngology

## 2010-07-08 DIAGNOSIS — L0201 Cutaneous abscess of face: Principal | ICD-10-CM | POA: Diagnosis present

## 2010-07-08 DIAGNOSIS — L03211 Cellulitis of face: Principal | ICD-10-CM | POA: Diagnosis present

## 2010-07-08 DIAGNOSIS — J45909 Unspecified asthma, uncomplicated: Secondary | ICD-10-CM | POA: Diagnosis present

## 2010-07-08 LAB — BASIC METABOLIC PANEL
BUN: 7 mg/dL (ref 6–23)
CO2: 29 mEq/L (ref 19–32)
Chloride: 103 mEq/L (ref 96–112)
Glucose, Bld: 109 mg/dL — ABNORMAL HIGH (ref 70–99)
Potassium: 3.5 mEq/L (ref 3.5–5.1)

## 2010-07-08 LAB — DIFFERENTIAL
Eosinophils Relative: 5 % (ref 0–5)
Lymphocytes Relative: 13 % (ref 12–46)
Lymphs Abs: 2.1 10*3/uL (ref 0.7–4.0)
Monocytes Relative: 13 % — ABNORMAL HIGH (ref 3–12)
Neutrophils Relative %: 70 % (ref 43–77)

## 2010-07-08 LAB — CBC
HCT: 42.1 % (ref 39.0–52.0)
MCH: 32.1 pg (ref 26.0–34.0)
MCV: 95.9 fL (ref 78.0–100.0)
RBC: 4.39 MIL/uL (ref 4.22–5.81)
WBC: 16.4 10*3/uL — ABNORMAL HIGH (ref 4.0–10.5)

## 2010-07-09 DIAGNOSIS — L03211 Cellulitis of face: Secondary | ICD-10-CM

## 2010-07-09 DIAGNOSIS — L0201 Cutaneous abscess of face: Secondary | ICD-10-CM

## 2010-07-10 LAB — DIFFERENTIAL
Eosinophils Absolute: 0.8 10*3/uL — ABNORMAL HIGH (ref 0.0–0.7)
Eosinophils Relative: 7 % — ABNORMAL HIGH (ref 0–5)
Lymphs Abs: 2 10*3/uL (ref 0.7–4.0)
Monocytes Absolute: 1.4 10*3/uL — ABNORMAL HIGH (ref 0.1–1.0)
Monocytes Relative: 13 % — ABNORMAL HIGH (ref 3–12)

## 2010-07-10 LAB — CBC
Platelets: 179 10*3/uL (ref 150–400)
RDW: 13.7 % (ref 11.5–15.5)
WBC: 10.4 10*3/uL (ref 4.0–10.5)

## 2010-07-17 ENCOUNTER — Telehealth: Payer: Self-pay | Admitting: Internal Medicine

## 2010-07-17 NOTE — Telephone Encounter (Signed)
Per Dimas Millin in our allergy lab, Xolair has been received. Pt is aware.

## 2010-07-23 NOTE — Assessment & Plan Note (Signed)
[name]                             PULMONARY OFFICE NOTE   Eric [name], Eric [name]                    MRN:          [id number]  DATE:09/17/2006                            DOB:          April 23, 1955    PULMONARY OFFICE FOLLOWUP   PROBLEMS:  1. Asthma with chronic obstructive pulmonary disease.  2. Allergic rhinitis.   HISTORY:  He is maintained on prednisone 15 mg daily and is doing well  with this.  We talked again about chronic prednisone.  He is taking  calcium with vitamin D, but I suggested the issue of Actonel or  equivalent be raised with Dr. Valentina Lucks.  He notices minor cough  productive of small amounts of white sputum.  There has been no chest  pain.  Nothing febrile or purulent.  He had a pneumonia during the  winter, and our chest x-ray of Jul 09, 2006 described left upper lobe  infiltrate continuing to improve, but not completely resolved.  Symptomatically he feels very much better and we decided we did not need  to do a chest film today.   MEDICATIONS:  1. Calcium with vitamin D.  2. Advair 250/50.  3. Flonase.  4. Prednisone 15 mg daily.  5. Nebulizer with albuterol.  6. P.r.n. use of Allegra D.  7. Ativan rescue inhaler.   DRUG INTOLERANCES:  ERYTHROMYCIN with GI upset, AVELOX with GI upset.   OBJECTIVE:  Weight 172 pounds, BP 116/70, pulse 65, room air saturation  95%.  He looks comfortable.  There are scattered squeaks and trace wheeze with fair air flow  bilaterally.  Unlabored.  No dullness.  No cough.  No neck vein distension or stridor.  The heart sounds are normal.  There is no cyanosis or edema.   IMPRESSION:  Asthma with chronic obstructive pulmonary disease,  currently doing fairly well on maintenance prednisone with appropriate  discussion of prednisone side effects, concerns, and alternatives again  done with him.   PLAN:  Taper prednisone slowly as able, perhaps to every other day or  every third day 10 mg  alternating with 15.  He will follow up with Dr.  Valentina Lucks considering bone calcium management in the face of chronic  prednisone.  Schedule return in 4 months, earlier p.r.n.     Clinton D. Maple Hudson, MD, [facility], FACP  Electronically Signed    [name]/MedQ  DD: 09/17/2006  DT: 09/18/2006  Job #: [id number]   cc:   Thora Lance, M.D.

## 2010-07-23 NOTE — Consult Note (Signed)
NAME:  Eric [name], Eric [name] NO.:  [id number]   MEDICAL RECORD NO.:  [id number]          PATIENT TYPE:  EMS   LOCATION:  MAJO                         FACILITY:  [facility]   PHYSICIAN:  Antony Contras, MD     DATE OF BIRTH:  [birth year]   DATE OF CONSULTATION:  11/27/2007  DATE OF DISCHARGE:                                 CONSULTATION   CHIEF COMPLAINT:  Submandibular infection.   HISTORY OF PRESENT ILLNESS:  The patient is a 55 year old white male and  patient of Dr. Lazarus Salines, who has a long history of submandibular gland  problems including removal of stone in the past.  He underwent a right  sialodochoplasty 2 days ago by Dr. Lazarus Salines in the office where no stone  was removed, but purulent drainage was noticed.  He has been treated  with Keflex since then, but has had increasing swelling of the right  submandibular region of the neck with increasing pain over the last 2  days.  Pain is no longer being controlled with Tylenol No. 3.  It is  painful to swallow and he has not been able to swallow much of anything.  He came to the emergency department under my recommendation because of  these symptoms.  He has been given fentanyl in the emergency department.  His pain is controlled, but was severe before.  A CT scan was also  performed.   PAST MEDICAL HISTORY:  Asthma and sinusitis.   MEDICATIONS:  Prednisone, Keflex, albuterol, Tylenol No. 3.   ALLERGIES:  No known drug allergies.   FAMILY HISTORY:  None.   SOCIAL HISTORY:  He denies alcohol or drug use.  He is a smoker.   REVIEW OF SYSTEMS:  Negative except as listed above.   PHYSICAL EXAMINATION:  VITAL SIGNS:  Temperature 98.9, blood pressure  103/58, pulse 81.  GENERAL:  The patient is in no acute distress and is pleasant and  cooperative.  He is alert.  EYES:  Extraocular movements intact and pupils are equal, round, and  reactive to light.  EARS:  External ears are normal and external canals are patent.  Tympanic membranes intact.  Middle ear spaces are aerated.  NOSE:  External nose is normal.  Nasal passages are patent with midline  septum.  ORAL CAVITY AND OROPHARYNX:  Lips, teeth, and gums are normal.  The  right floor of mouth is edematous with a Vicryl suture placed and  exudate of the duct opening region.  The patient is not able to open the  mouth fully.  The tongue and oropharynx are normal.  NECK:  The right submandibular region of the neck is edematous, firm,  and tender.  There is no fluctuance.  The left submandibular gland is  prominent upon palpation.  The remainder of the neck is without  tenderness or mass.  LYMPHATICS:  No enlarged lymph nodes are palpated.  THYROID:  Normal to palpation.  SALIVARY GLANDS:  As above.  NEURO:  Cranial nerves II-XII grossly intact.   RADIOLOGIC EXAMINATION:  A neck CT with contrast was personally  reviewed.  This demonstrates  chronic changes in the ethmoid sinuses.  Both submandibular glands have large calcifications within the gland  hilum.  The right-sided gland is enlarged with surrounding stranding on  CT, but no evidence of abscess.  Edema does extend into the pharyngeal  wall a bit, but the airway was widely patent on CT.   LABORATORY DATA:  White blood count 18.4.   ASSESSMENT:  The patient is a 55 year old white male with right  submandibular sialoadenitis with sialolithiasis.   PLAN:  I discussed the options with the patient including inpatient  management with IV therapy versus outpatient management with a change in  antibiotic.  He is not interested in being hospitalized, and I feel that  this is reasonable given that he knows what to look for for worsening of  symptoms and he is willing to come back immediately.  These worsening of  symptoms includes increased swelling, spiking fevers, or any increase in  work of breathing.  He will get an IV dosing of clindamycin in the  emergency department.  A prescription for oral  clindamycin has already  been called in, 300 mg 4 times daily for 7 days.  He will also be given  20 tablets of Percocet as a prescription by the emergency department.  In the long run, submandibular gland excision may be considered by Dr.  Lazarus Salines for management and prevention of further infections.      Antony Contras, MD  Electronically Signed     DDB/MEDQ  D:  11/27/2007  T:  11/28/2007  Job:  [id number]

## 2010-07-23 NOTE — Assessment & Plan Note (Signed)
[facility]                             PULMONARY OFFICE NOTE   TARRIN, LEBOW                    MRN:          [id number]  DATE:01/18/2007                            DOB:          03-May-1955    PROBLEM LIST:  1. Asthma with chronic obstructive pulmonary disease.  2. Allergic rhinitis.  3. Recurrent sinusitis.   HISTORY:  He had called after turning in a sputum at his home lab  October 31.  That had grown 4+ Moraxella lactamase positive.  We called  in Biaxin 500 mg b.i.d. for a week and he says that cleared him rapidly.  He has been on prednisone at 10 mg daily for several weeks and feels  well maintained with that.  Dr. Valentina Lucks reportedly did a bone density  examination earlier this year which was okay.  He has had flu vaccine  this year and had second Pneumococcal vaccine this year.   MEDICATIONS:  1. Calcium with vitamin D.  2. Advair 250/50.  3. Flonase.  4. Home nebulizer with albuterol.  5. Occasional use of Allegra D.  6. Albuterol inhaler.  7. Atrovent inhaler.  8. Mucinex.   ALLERGIES:  ERYTHROMYCIN with GI upset, AVELOX with GI upset.   OBJECTIVE:  VITAL SIGNS:  Weight 171 pounds, blood pressure 94/66, pulse  68, room air saturation 99%.  LUNGS:  Breath sounds are very diminished, expiratory phase is slow, but  unlabored and without wheeze or rhonchi.  HEENT:  Mild nasal stuffiness with turbinate edema, regular pulse, no  murmur, no edema, no clubbing.   IMPRESSION:  1. Asthma with chronic obstructive pulmonary disease currently stable.  2. Recurrent rhinosinusitis, currently resolved.   PLAN:  1. He will taper prednisone very slowly as tolerated.  2. Neti pot saline nasal lavage with print information given.  3. Schedule return in four months, earlier p.r.n.     Clinton D. Maple Hudson, MD, [facility] Bollman, FACP  Electronically Signed    [name]/MedQ  DD: 01/18/2007  DT: 01/19/2007  Job #: [id number]   cc:   Thora Lance, M.D.

## 2010-07-25 ENCOUNTER — Ambulatory Visit (INDEPENDENT_AMBULATORY_CARE_PROVIDER_SITE_OTHER): Payer: 59

## 2010-07-25 DIAGNOSIS — J45909 Unspecified asthma, uncomplicated: Secondary | ICD-10-CM

## 2010-07-25 MED ORDER — OMALIZUMAB 150 MG ~~LOC~~ SOLR
300.0000 mg | Freq: Once | SUBCUTANEOUS | Status: AC
  Administered 2010-07-25: 300 mg via SUBCUTANEOUS

## 2010-07-26 NOTE — Assessment & Plan Note (Signed)
[facility]                             PULMONARY OFFICE NOTE   OSHA, ERRICO                    MRN:          [id number]  DATE:06/02/2006                            DOB:          1955/09/17    PULMONARY OFFICE FOLLOWUP   PROBLEMS:  1. Asthma with chronic obstructive pulmonary disease.  2. Allergic rhinitis.   HISTORY:  Mr. Monestime was worked in sick today.  Two days ago he was  doing yard work when he experienced sudden onset of chills, fever to 102  or 103.  He started Augmentin then.  Persistent fever despite ibuprofen  and Tylenol until this morning when fever seemed to break.  Usually  fever means sinusitis to him, but he says he is not having any headache  and no more than usual amount of head congestion.  Scant sputum this  morning is brown or maybe slightly bloody.  He had a sharp left lateral  ribcage pleuritic pain this morning, which is fading fast.  This is day  3 of Augmentin.  He has had no problem with his legs to suggest DVT.   MEDICATIONS:  1. Calcium with vitamin D.  2. Advair 250/50.  3. Flonase.  4. Prednisone 15 mg.  5. Augmentin as stated.  6. P.r.n. use of Allegra D or Sudafed.  7. Albuterol inhaler or Atrovent inhaler.   DRUG INTOLERANCES:  ERYTHROMYCIN, AVELOX, both causing GI upset.   He has had pneumococcal vaccine at least once, but not recently.   OBJECTIVE:  Weight 165 pounds, BP 104/62, pulse 80, room air saturation  97%.  Mild nasal congestion.  Slow bilateral expiratory wheeze.  I do not hear dullness to percussion  or pleural rub.  Heart sounds are normal.  I do not find adenopathy.   IMPRESSION:  1. Bronchitis, possible early pneumonia with pleurisy.  I do not think      he has had a pulmonary embolism.  2. Nasal congestion.  Cannot exclude sinusitis.  Since he has already      been on Augmentin, I am going to let him proceed with that, but we      will take a chest x-ray.  Rest and fluids.   He      will keep his scheduled appointment, but call earlier p.r.n.      failure to respond quickly.  We still need to get a followup      pulmonary function test when he returns.     Clinton D. Maple Hudson, MD, Tonny Bollman, FACP  Electronically Signed    [name]/MedQ  DD: 06/02/2006  DT: 06/02/2006  Job #: [id number]   cc:   Thora Lance, M.D.

## 2010-07-26 NOTE — Assessment & Plan Note (Signed)
[facility]                               PULMONARY OFFICE NOTE   MIKLE, STERNBERG                    MRN:          [id number]  DATE:12/30/2005                            DOB:          [birth year]    PROBLEM:  1. Asthma with chronic obstructive pulmonary disease.  2. Allergic rhinitis.   HISTORY:  He says he has been doing quite well since he asked [place] to call in  a course of Ketek for increasing sinus and chest congestion two weeks ago. I  talked with him today about Ketek and its primary indication for mild-  moderate pneumonia with black box warning. 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 He said  he felt well enough today that he had been going to the gym twice. We  discussed how he could intervene to head off early infections. He says he  had a childhood reaction of some kind possibly to penicillin, but since then  has taken cephalosporins and Augmentin with no trouble at all. Avalox had  caused some diarrhea. He had flu shot today.   MEDICATIONS:  1. Calcium with vitamin D.  2. Prednisone 10 mg daily.  3. Flonase.  4. Advair 250/50.  5. Rescue albuterol.  6. Albuterol by nebulizer.  7. Allegra D or Sudafed.   DRUG INTOLERANCES:  1. ERYTHROMYCIN.  2. AVALOX WITH GI DISTRESS.   OBJECTIVE:  Weight: 164 pounds. Blood pressure: 118/74. Pulse: Regular, 73.  Room air saturation 93%.  Trace wheeze only heard at the right base. Otherwise, lungs are quiet and  clear. Work of breathing is not increased.  There is no neck vein distention, peripheral edema, cyanosis, clubbing or  adenopathy.  Heart sounds are regular without extra beats or murmur.  There may be slight nasal congestion without obvious obstruction or  drainage.   IMPRESSION:  1. Rhinitis and recurrent sinusitis.  2. Asthma with chronic obstructive pulmonary disease.   Problems are currently stable.   PLAN:  1.  [place] prescriptions were given for Septra DS for ten days and for      doxycycline for ten days. He will use either one at his discretion as      needed and conservatively. He understands issues of medication, allergy      and drug resistance. We will save Ketek for unusual episodes, although      he has confidence in it. Schedule return four months, earlier p.r.n.       Clinton D. Maple Hudson, MD, [facility], FACP      [name]/MedQ  DD:  12/30/2005  DT:  12/31/2005  Job #:  [id number]   cc:   Thora Lance, M.D.

## 2010-07-26 NOTE — Assessment & Plan Note (Signed)
[facility]                             PULMONARY OFFICE NOTE   KADAN, MILLSTEIN                    MRN:          [id number]  DATE:04/30/2006                            DOB:          05-Feb-1956    PROBLEMS:  1. Asthma with chronic obstructive pulmonary disease.  2. Allergic rhinitis.   HISTORY:  He comes for scheduled followup, having recently placed  himself on a course of doxycycline for headache, yellow blood-tinged  nasal discharge and chest congestion, all of which began in the past  week.  He is beginning to improve from that.  He has been maintaining  prednisone 10 to 15 mg daily.  He feels better when he can exercise, and  has not felt well enough to exercise this week.  He does not think he  needs acute treatment in the office today.  We discussed available  medications.   MEDICATIONS:  1. Calcium with vitamin D.  2. Prednisone 10 to 15 mg daily.  3. Flonase 2 sprays each nostril.  4. Advair 250/50.  5. Home nebulizer with albuterol and ipratropium.  6. Occasional use of either Allegra D or Sudafed.   ALLERGIES:  DRUG INTOLERANT OF ERYTHROMYCIN AND AVELOX WITH GI UPSET.   OBJECTIVE:  Weight 164 pounds, BP 110/64, pulse regular 81, room air  saturation 95%.  There is mild to moderate bilateral expiratory wheeze, no accessory  muscle use, neck vein distention, cyanosis or edema.  Mild nasal stuffiness without visible drainage.  Heart sounds are normal and regular.   IMPRESSION:  Chronic asthma with chronic obstructive pulmonary disease  and recent exacerbation, recent rhinitis and possible sinusitis.   PLAN:  1. Omnicef 300 mg x2 daily for 7 days.  2. Try sample Symbicort 160/4.5 as an alternative to his Advair, 2      puffs b.i.d. with instruction.  He will call for prescription if he      wishes to stay on this, or will drop back to his Advair      250/50 prescription.  3. Schedule return in 4 months, but earlier  p.r.n.     Clinton D. Maple Hudson, MD, Tonny Bollman, FACP  Electronically Signed    [name]/MedQ  DD: 04/30/2006  DT: 05/01/2006  Job #: [id number]   cc:   Thora Lance, M.D.

## 2010-08-15 NOTE — Discharge Summary (Signed)
  NAME:  Eric [name], Eric [name] NO.:  [id number]  MEDICAL RECORD NO.:  [id number]           PATIENT TYPE:  I  LOCATION:  5120                         FACILITY:  [facility]  PHYSICIAN:  Zola Button T. Lazarus Salines, M.D. DATE OF BIRTH:  [birth year]  DATE OF ADMISSION:  07/08/2010 DATE OF DISCHARGE:  07/10/2010                              DISCHARGE SUMMARY   FINAL DIAGNOSIS:  Postoperative wound infection, status post right and left submandibular gland excision.  PROCEDURE PERFORMED:  None.  CONDITION ON DISCHARGE:  Ambulatory with improving infection and decreased drainage.  Return visit in 1 week.  Prescription is for Augmentin.  CHIEF COMPLAINTS:  Right neck swelling.  HISTORY OF PRESENT ILLNESS:  This is a 55 year old white male 2-3 days status post submandibular gland excision who now has increasing swelling and tenderness over his right neck.  The wound was opened in the office and frank pus was cultured and expressed.  A Penrose drain was placed. The plans were to admit him to the hospital.  PAST MEDICAL HISTORY:  He is allergic to PENICILLIN and ERYTHROMYCIN. He had bilateral submandibular gland excision 2 days prior to admission.  REVIEW OF SYSTEMS:  Noncontributory.  PHYSICAL EXAMINATION:  This is a stocky middle-aged white male who is in substantial distress.  His neck is swollen and tender.  He has a bulky dressing in place.  LABORATORY STUDIES:  Glucose 109.  White blood cell count 16,400.  ADMISSION DIAGNOSIS:  Right submandibular excision, postoperative wound infection.  HOSPITAL COURSE:  He was admitted to the hospital and begun on intravenous Unasyn and vancomycin.  Previous culture from infection prior to his surgery was Eikenella corrodens.  He had been maintained on Keflex postop surgery.  Infectious disease consultation was obtained. After 2 days, he had substantial improvement and was switched over to oral Unasyn with good tolerance, discharged to  his home in the care of his family on hospital day #3.  Given prescriptions for Unasyn.  Final white blood cell count was 10,200.  He will follow up in my office in 3-4 days.     [name]. Lazarus Salines, M.D.     KTW/MEDQ  D:  08/06/2010  T:  08/07/2010  Job:  [id number]  Electronically Signed by Flo Shanks M.D. on 08/15/2010 06:13:30 PM

## 2010-08-15 NOTE — Discharge Summary (Addendum)
  NAME:  Eric [name], Eric [name] NO.:  [id number]  MEDICAL RECORD NO.:  [id number]           PATIENT TYPE:  LOCATION:                                 FACILITY:  PHYSICIAN:  Josilynn Losh T. Lazarus Salines, M.D. DATE OF BIRTH:  10-Mar-1956  DATE OF ADMISSION:  07/04/2010 DATE OF DISCHARGE:  07/05/2010                              DISCHARGE SUMMARY   FINAL DIAGNOSIS:  Bilateral submandibular sialoadenitis and sialolithiasis.  PROCEDURE PERFORMED:  Bilateral submandibular gland excision, July 04, 2010.  CONDITION ON DISCHARGE:  Ambulatory.  RETURN VISIT:  One week for suture removal.  CHIEF COMPLAINT:  Neck swelling.  HISTORY OF PRESENT ILLNESS:  A 55 year old white male with a several- year history of known bilateral submandibular gland sialolithiasis, hospitalized recently with a severe right-sided sialoadenitis requiring hospitalization and incision and drainage.  He had a decent recovery after antibiotic therapy.  In the past, the cultures showed Eikenella corrodens.  He is admitted readmitted at the present time for bilateral excision, submandibular glands.  PAST MEDICAL HISTORY:  He is allergic to PENICILLIN and ERYTHROMYCIN. He has environmental allergies, asthma, reflux, and some primary lung disease.  FAMILY HISTORY:  Positive for allergies and cancer.  REVIEW OF SYSTEMS:  Noncontributory.  SOCIAL SITUATION:  He is married.  He does not smoke.  PHYSICAL EXAMINATION:  GENERAL:  A sturdy, middle-aged white male in no distress. HEENT:  There is an open cavity in the right floor of mouth consistent with recent I and D.  There is minimal expressed cloudy saliva.  He has some swelling with minimal tenderness in both submandibular glands. NECK:  Without adenopathy. LUNGS:  Clear. HEART:  Regular rate and rhythm and no murmurs. ABDOMEN:  Soft and active. EXTREMITIES:  Normal configuration.  ADMISSION DIAGNOSIS:  Bilateral submandibular sialolithiasis.  HOSPITAL  COURSE:  On the day of his admission, he was taken to the operating room where bilateral submandibular gland excision was performed without difficulty.  He was transferred from the operating room to a regular patient care floor afterwards with drains in place.  On the following postoperative morning, he was still relatively puny.  Small amount of JP drainage.  Plans were made for discharge the following morning, but he began to feel better and was discharged later that same day to come to my office for drain removal.     Zola Button T. Lazarus Salines, M.D.     KTW/MEDQ  D:  08/06/2010  T:  08/07/2010  Job:  [id number]  Electronically Signed by Flo Shanks M.D. on 08/15/2010 06:13:33 PM

## 2010-08-22 ENCOUNTER — Ambulatory Visit: Payer: 59

## 2010-08-23 ENCOUNTER — Ambulatory Visit (INDEPENDENT_AMBULATORY_CARE_PROVIDER_SITE_OTHER): Payer: 59

## 2010-08-23 DIAGNOSIS — J45909 Unspecified asthma, uncomplicated: Secondary | ICD-10-CM

## 2010-08-26 ENCOUNTER — Ambulatory Visit: Payer: 59

## 2010-08-27 MED ORDER — OMALIZUMAB 150 MG ~~LOC~~ SOLR
300.0000 mg | Freq: Once | SUBCUTANEOUS | Status: AC
  Administered 2010-08-27: 300 mg via SUBCUTANEOUS

## 2010-09-06 ENCOUNTER — Telehealth: Payer: Self-pay | Admitting: Internal Medicine

## 2010-09-06 NOTE — Telephone Encounter (Signed)
Called, spoke with pt's wife.  States pt received 2 shots of xolair on June 15th.  States usually by now pt has noticed an improvement in his breathing however, this time the Geoffry Paradise has not "kicked in" yet.  Pt has increased prednisone to 30mg  -- having wheezing, SOB, and chest tightness.  Would like [name] to be aware of this and requesting his recs.  Would also like to know if he is at baseline dose yet or if this is still being adjusted.  Pls advise.  Thanks!

## 2010-09-06 NOTE — Telephone Encounter (Signed)
Pt wife advised. Zyion Leidner, CMA  

## 2010-09-06 NOTE — Telephone Encounter (Signed)
This is his planned maintenance Xolair dose.  Suspect poor air quality/ heat / ozone is bothering his breathing as it is for other patients. Stay in [facility], use prednisone as needed - may need 40-60 mg/ day for a couple of days.

## 2010-09-09 ENCOUNTER — Other Ambulatory Visit: Payer: Self-pay | Admitting: Internal Medicine

## 2010-09-16 ENCOUNTER — Ambulatory Visit: Payer: 59 | Admitting: Internal Medicine

## 2010-09-23 ENCOUNTER — Ambulatory Visit (INDEPENDENT_AMBULATORY_CARE_PROVIDER_SITE_OTHER): Payer: 59

## 2010-09-23 DIAGNOSIS — J45909 Unspecified asthma, uncomplicated: Secondary | ICD-10-CM

## 2010-09-23 MED ORDER — OMALIZUMAB 150 MG ~~LOC~~ SOLR
300.0000 mg | Freq: Once | SUBCUTANEOUS | Status: AC
  Administered 2010-09-23: 300 mg via SUBCUTANEOUS

## 2010-10-11 ENCOUNTER — Other Ambulatory Visit: Payer: Self-pay | Admitting: Internal Medicine

## 2010-10-22 ENCOUNTER — Telehealth: Payer: Self-pay | Admitting: Internal Medicine

## 2010-10-22 NOTE — Telephone Encounter (Signed)
Spouse is aware Ventolin HFA inhaler has been sent to his pharmacy. Verified with [facility] that rx was received and is ready for pt to pick up.

## 2010-10-24 ENCOUNTER — Ambulatory Visit: Payer: 59 | Admitting: Internal Medicine

## 2010-10-24 ENCOUNTER — Ambulatory Visit (INDEPENDENT_AMBULATORY_CARE_PROVIDER_SITE_OTHER): Payer: 59

## 2010-10-24 ENCOUNTER — Encounter: Payer: Self-pay | Admitting: Internal Medicine

## 2010-10-24 ENCOUNTER — Ambulatory Visit (INDEPENDENT_AMBULATORY_CARE_PROVIDER_SITE_OTHER): Payer: 59 | Admitting: Internal Medicine

## 2010-10-24 VITALS — BP 118/68 | HR 78 | Ht 68.0 in | Wt 178.8 lb

## 2010-10-24 DIAGNOSIS — J45909 Unspecified asthma, uncomplicated: Secondary | ICD-10-CM

## 2010-10-24 DIAGNOSIS — J329 Chronic sinusitis, unspecified: Secondary | ICD-10-CM

## 2010-10-24 MED ORDER — OMALIZUMAB 150 MG ~~LOC~~ SOLR
300.0000 mg | Freq: Once | SUBCUTANEOUS | Status: AC
  Administered 2010-10-24: 300 mg via SUBCUTANEOUS

## 2010-10-24 MED ORDER — ROFLUMILAST 500 MCG PO TABS: 500.0000 ug | ORAL_TABLET | Freq: Every day | ORAL | Status: DC

## 2010-10-24 NOTE — Assessment & Plan Note (Addendum)
Explained allergic vs nonallergic and expectations for Xolair again. It is not expected to help non-allergic/non-IgE mediated exacerbations. We will try again with Daliresp as an airway anti-inflammatory. Ask Dr Valentina Lucks if time to update bone density check.

## 2010-10-24 NOTE — Patient Instructions (Signed)
Continue present meds, tapering prednisone as able  Samples/ script Daliresp   1 daily with food

## 2010-10-24 NOTE — Progress Notes (Signed)
Subjective:    Patient ID: Eric [name], male    DOB: September 14, 1955, 55 y.o.   MRN: [id number]  HPI    Review of Systems     Objective:   Physical Exam        Assessment & Plan:   Subjective:    Patient ID: Eric [name], male    DOB: 06/04/1955, 55 y.o.   MRN: [id number]  HPI 4/ 3/12- 55 y.o. never smoker with hx chronic obstructive asthma, now on Xolair. Xolair is being dosed 150 mg per month, but he feels it wearing off by 3 weeks and asks about a dosing change. He remains on prednisone 5 mg daily. At 3 weeks after a xolair dose he begins to feel tighter, more wheeze. He had to miss work today because of asthma and he increased prednisone today to 20 mg. . He denies obvious change with seasonal pollens- no itch, sneeze,watery eyes.   10/24/10- 55 y.o. never smoker with hx chronic obstructive asthma, now on Xolair. Has continued Xoalir monthly. One injection/ month lasted few weeks-not a month. Increased to 2 injections/ month, same total dose,  and not as clearly helping. Has continued chronic maintenance prednisone. We discussed allergic vs nonallergic triggers. He is watching what happens as we enter Fall season. He had to miss work due to asthma flare 2 days ago and was aggresively using neb. Trigger was lawnmower smoke in garage.  Today is great. Currently on prednisone 20 mg daily. He remembers bone density check by Dr [name] within last few years.  We discussed Daliresp and he is interested in giving it another try.   Review of Systems See HPI Constitutional:   No weight loss, night sweats,  Fevers, chills, fatigue, lassitude. HEENT:   No headaches,  Difficulty swallowing,  Tooth/dental problems,  Sore throat,                No sneezing, itching, ear ache, nasal congestion, post nasal drip,  CV:  No chest pain,  Orthopnea, PND, swelling in lower extremities, anasarca, dizziness, palpitations GI  No heartburn, indigestion, abdominal pain, nausea, vomiting, diarrhea,  change in bowel habits, loss of appetite Resp: t.  No excess mucus, no productive cough,  No non-productive cough,  No coughing up of blood.  No change in color of mucus.  No wheezing. Skin: no rash or lesions. GU: no dysuria, change in color of urine, no urgency or frequency.  No flank pain. MS:  No joint pain or swelling.  No decreased range of motion.  No back pain. Psych:  No change in mood or affect. No depression or anxiety.  No memory loss.     Objective:   Physical Exam General- Alert, Oriented, Affect-appropriate, Distress- none acute Skin- rash-none, lesions- none, excoriation- none Lymphadenopathy- none Head- atraumatic            Eyes- Gross vision intact, PERRLA, conjunctivae- clear secretions            Ears- Hearing, canals normal            Nose- Clear, no-Septal dev, mucus, polyps, erosion, perforation             Throat- Mallampati II , mucosa clear , drainage- none, tonsils- atrophic Neck- flexible , trachea midline, no stridor , thyroid nl, carotid no bruit Chest - symmetrical excursion , unlabored           Heart/CV- RRR , no murmur , no gallop  , no rub,  nl s1 s2                           - JVD- none , edema- none, stasis changes- none, varices- none           Lung- coarse bilateral wheeze, unlabored;    cough- none , dullness-none, rub- none           Chest wall-  Abd- tender-no, distended-no, bowel sounds-present, HSM- no Br/ Gen/ Rectal- Not done, not indicated Extrem- cyanosis- none, clubbing, none, atrophy- none, strength- nl Neuro- grossly intact to observation          Assessment & Plan:

## 2010-10-27 NOTE — Assessment & Plan Note (Signed)
Sniffing some today but he does not sound congested nasally, is not complaining of headache and is not blowing purulent discharge from his nose.

## 2010-11-01 ENCOUNTER — Telehealth: Payer: Self-pay | Admitting: Internal Medicine

## 2010-11-01 NOTE — Telephone Encounter (Signed)
Spoke with Eric [name] in [employer] and she states forms were completed and faxed to company on 10-28-10. Pt wife aware. Carron Curie, CMA

## 2010-11-20 ENCOUNTER — Other Ambulatory Visit: Payer: Self-pay | Admitting: Internal Medicine

## 2010-11-21 ENCOUNTER — Ambulatory Visit (INDEPENDENT_AMBULATORY_CARE_PROVIDER_SITE_OTHER): Payer: 59

## 2010-11-21 DIAGNOSIS — J45909 Unspecified asthma, uncomplicated: Secondary | ICD-10-CM

## 2010-11-21 MED ORDER — OMALIZUMAB 150 MG ~~LOC~~ SOLR
300.0000 mg | Freq: Once | SUBCUTANEOUS | Status: AC
  Administered 2010-11-21: 300 mg via SUBCUTANEOUS

## 2010-12-09 LAB — DIFFERENTIAL
Lymphocytes Relative: 5 — ABNORMAL LOW
Monocytes Absolute: 1
Monocytes Relative: 5
Neutro Abs: 16.3 — ABNORMAL HIGH
Neutrophils Relative %: 88 — ABNORMAL HIGH

## 2010-12-09 LAB — CBC
Hemoglobin: 15.6
RBC: 4.86
WBC: 18.4 — ABNORMAL HIGH

## 2010-12-17 ENCOUNTER — Other Ambulatory Visit: Payer: Self-pay | Admitting: *Deleted

## 2010-12-17 MED ORDER — ALBUTEROL SULFATE HFA 108 (90 BASE) MCG/ACT IN AERS: INHALATION_SPRAY | RESPIRATORY_TRACT | Status: DC

## 2010-12-19 ENCOUNTER — Ambulatory Visit (INDEPENDENT_AMBULATORY_CARE_PROVIDER_SITE_OTHER): Payer: 59

## 2010-12-19 DIAGNOSIS — J45909 Unspecified asthma, uncomplicated: Secondary | ICD-10-CM

## 2010-12-23 DIAGNOSIS — J45909 Unspecified asthma, uncomplicated: Secondary | ICD-10-CM

## 2010-12-23 MED ORDER — OMALIZUMAB 150 MG ~~LOC~~ SOLR
300.0000 mg | Freq: Once | SUBCUTANEOUS | Status: AC
  Administered 2010-12-23: 300 mg via SUBCUTANEOUS

## 2011-01-16 ENCOUNTER — Ambulatory Visit (INDEPENDENT_AMBULATORY_CARE_PROVIDER_SITE_OTHER): Payer: 59

## 2011-01-16 DIAGNOSIS — J45909 Unspecified asthma, uncomplicated: Secondary | ICD-10-CM

## 2011-01-20 ENCOUNTER — Other Ambulatory Visit: Payer: Self-pay | Admitting: *Deleted

## 2011-01-20 DIAGNOSIS — J45909 Unspecified asthma, uncomplicated: Secondary | ICD-10-CM

## 2011-01-20 MED ORDER — OMALIZUMAB 150 MG ~~LOC~~ SOLR
300.0000 mg | Freq: Once | SUBCUTANEOUS | Status: AC
  Administered 2011-01-20: 300 mg via SUBCUTANEOUS

## 2011-01-20 MED ORDER — PREDNISONE 5 MG PO TABS: ORAL_TABLET | ORAL | Status: DC

## 2011-02-05 ENCOUNTER — Telehealth: Payer: Self-pay | Admitting: Internal Medicine

## 2011-02-05 NOTE — Telephone Encounter (Signed)
No message needed-Needed to schedule Xolair Delivery---sent to Tammy [facility].  Antionette Fairy

## 2011-02-13 ENCOUNTER — Ambulatory Visit (INDEPENDENT_AMBULATORY_CARE_PROVIDER_SITE_OTHER): Payer: 59

## 2011-02-13 DIAGNOSIS — J45909 Unspecified asthma, uncomplicated: Secondary | ICD-10-CM

## 2011-02-13 MED ORDER — OMALIZUMAB 150 MG ~~LOC~~ SOLR
300.0000 mg | Freq: Once | SUBCUTANEOUS | Status: AC
  Administered 2011-02-13: 300 mg via SUBCUTANEOUS

## 2011-02-24 ENCOUNTER — Telehealth: Payer: Self-pay | Admitting: Internal Medicine

## 2011-02-24 MED ORDER — PREDNISONE 5 MG PO TABS: ORAL_TABLET | ORAL | Status: DC

## 2011-02-24 NOTE — Telephone Encounter (Signed)
Per CY last OV note pt is to take prednisone burst and taper as directed. At last OV he was on 20mg  daily. Refill sent. Pt aware. Carron Curie, CMA

## 2011-03-06 ENCOUNTER — Telehealth: Payer: Self-pay | Admitting: Internal Medicine

## 2011-03-06 NOTE — Telephone Encounter (Signed)
I spoke with pt and he c/o increase SOB w/ exertion and at rest, chest tightness, dry cough, wheezing and has been getting worse x 3 weeks. Pt states he is waking up every 2-3 hrs as well. Pt is taking prednisone 20mg -40mg  a day. Pt requesting to be seen ASAP. Please advise [name], thanks  Allergies  Allergen Reactions  . Erythromycin Ethylsuccinate     REACTION: unspecified  . Penicillins

## 2011-03-06 NOTE — Telephone Encounter (Signed)
Spoke with pt and notified of recs per [name]. He states will try taking pred 60 mg daily and will go ahead to UC today

## 2011-03-06 NOTE — Telephone Encounter (Signed)
Per CY-Prednisone 60mg /day ? Need antibiotic first available workin or UC or ER.

## 2011-03-07 ENCOUNTER — Telehealth: Payer: Self-pay | Admitting: Internal Medicine

## 2011-03-07 NOTE — Telephone Encounter (Addendum)
Pt's wife stated that she will check w/ her husband & will call me back to let me know if appt will work for pt's schedule.  Again advised pt's wife to have pt go to [facility] or ED if symptoms do not improve or if symptoms worsened.  Advised pt I could not guarantee the appointment's availability after 5:30 pm today.  I will close out this message once receiving an answer from pt or at 5:30 today.    Antionette Fairy

## 2011-03-07 NOTE — Telephone Encounter (Signed)
Spoke w/ pt's wife.  Pt's wife stated that her husband felt that the prednisone is working & that an appointment is not needed at this time.  I advised pt's wife once again that pt should go to ED of UC if necessary this weekend & to call if we can be of future help.  Pt's wife stated nothing further needed at this time.  Antionette Fairy

## 2011-03-17 ENCOUNTER — Ambulatory Visit (INDEPENDENT_AMBULATORY_CARE_PROVIDER_SITE_OTHER): Payer: 59

## 2011-03-17 DIAGNOSIS — J45909 Unspecified asthma, uncomplicated: Secondary | ICD-10-CM

## 2011-03-18 DIAGNOSIS — J45909 Unspecified asthma, uncomplicated: Secondary | ICD-10-CM

## 2011-03-18 MED ORDER — OMALIZUMAB 150 MG ~~LOC~~ SOLR
300.0000 mg | Freq: Once | SUBCUTANEOUS | Status: AC
  Administered 2011-03-18: 300 mg via SUBCUTANEOUS

## 2011-04-02 ENCOUNTER — Telehealth: Payer: Self-pay | Admitting: Internal Medicine

## 2011-04-02 NOTE — Telephone Encounter (Signed)
Spoke with pt's spouse. She states calling to see if [name] has filled out the FMLA forms for the pt. She states that they were to have sent these to [place] last week and they have to be completed by 04/07/11. Please advise, thanks!

## 2011-04-02 NOTE — Telephone Encounter (Signed)
lmomtcb x1 

## 2011-04-07 NOTE — Telephone Encounter (Signed)
Pt's spouse states she is returning call from [place] (says she called this morning). Hazel Sams

## 2011-04-07 NOTE — Telephone Encounter (Signed)
I spoke with patients spouse(Eric [name]) on Thursday 04-03-2011; she understands that I spoke with Eric [name] in [place] information from [employer] and still needed patient to sign release form and mail or come by and pay $25 fee. Also, that Eric [name] mailed the needed information to patients home address and never got anything back so by [employer] rules she is to throw out anything over 67 weeks old and has not had any response from patient.Eric [name] stated that the patient indeed signed a form but nothing was ever mailed to their home address regarding this and she sent $25 check for fee(written on 03-17-2011) to fill out paperwork. Eric [name] was not happy at the fact that [employer] could not locate anything about FMLA papers for patient and that she threw out the paperwork. I explained to Eric [name] that I would speak to my Sales executive regarding the rules for [employer] but in the meantime get the papers needed for patients FMLA and get sent back (as they are due on 04-07-2011).    After looking in [facility] today(04-07-2011) the release form and FMLA paperwork(dated 03-06-2012) was located scanned in [employer]; I have printed out a copy, got CY to sign, and faxed to the [employer].Eric [name] is aware that the information has been faxed and will update me if anything else is needed for [employer]. As far as the $25 check- Eric [name] is aware that Eric [name] in [place] will return in the morning and I will address this her asap(Eric [name] states the check has not cleared the bank as of today)-unsure who processes checks for [employer].

## 2011-04-09 NOTE — Telephone Encounter (Signed)
I spoke with Elease Hashimoto in [employer] today-she states she is the one who gets checks that are mailed in for payment to [facility] and she has not gotten any payment on behalf of patient. Elease Hashimoto suggests that Laura(wife to patient) stop payment on the check she wrote and no fee will be due as our floor took care of the FMLA paperwork for patient. I have left a message for Vernona Rieger to call me back to go over all this with her.

## 2011-04-11 NOTE — Telephone Encounter (Signed)
Returning katie's call can be reached at [phone].Raylene Everts

## 2011-04-11 NOTE — Telephone Encounter (Signed)
I spoke with Eric [name] and advised her of Eric [name] response. She voiced her understanding and is going to cancel the payment on the check. Eric [name] needed nothing further

## 2011-04-14 ENCOUNTER — Ambulatory Visit (INDEPENDENT_AMBULATORY_CARE_PROVIDER_SITE_OTHER): Payer: 59

## 2011-04-14 ENCOUNTER — Other Ambulatory Visit: Payer: Self-pay | Admitting: Internal Medicine

## 2011-04-14 ENCOUNTER — Ambulatory Visit: Payer: 59 | Admitting: Internal Medicine

## 2011-04-14 ENCOUNTER — Telehealth: Payer: Self-pay | Admitting: Internal Medicine

## 2011-04-14 DIAGNOSIS — J45909 Unspecified asthma, uncomplicated: Secondary | ICD-10-CM

## 2011-04-14 MED ORDER — OMALIZUMAB 150 MG ~~LOC~~ SOLR
300.0000 mg | Freq: Once | SUBCUTANEOUS | Status: AC
  Administered 2011-04-14: 300 mg via SUBCUTANEOUS

## 2011-04-14 NOTE — Telephone Encounter (Signed)
Pt would like to know if his xolair dose can be increased and maybe he could decrease his prednisone dosage--pls advise

## 2011-04-14 NOTE — Telephone Encounter (Signed)
IF we can get his insurance to agree- We can either increase the current dose from 300 up to 375 mg once per month, or increase the interval frequency, so he gets 300 mg every 2 weeks.. Which would he rather try for?

## 2011-04-15 ENCOUNTER — Telehealth: Payer: Self-pay | Admitting: Internal Medicine

## 2011-04-15 DIAGNOSIS — J45909 Unspecified asthma, uncomplicated: Secondary | ICD-10-CM

## 2011-04-15 MED ORDER — OMALIZUMAB 150 MG ~~LOC~~ SOLR: 375.0000 mg | SUBCUTANEOUS | Status: DC

## 2011-04-15 NOTE — Telephone Encounter (Signed)
He wants to try increasing Xolair dose if possible to reduce need for continuous steroids.  Preference would be to increase monthly dose to 375 mg, over alternative of bimonthly shots.  Plan Rx increase Xolair to 375 mg/ month for dx obstructive asthma.

## 2011-04-15 NOTE — Telephone Encounter (Signed)
Called spoke with patient, he stated that he would prefer trying to increase the dose to 375mg  once per month and if that won't be covered, to increase the frequency.  Will forward to [name] to let him know.

## 2011-04-16 NOTE — Telephone Encounter (Signed)
Script given to [facility] to increase Xolair to 375 mg/ monthly

## 2011-04-16 NOTE — Telephone Encounter (Signed)
LMOMTCB x 1 

## 2011-04-16 NOTE — Telephone Encounter (Signed)
Pt is aware Xolair dosage has been increased to 375mg  a month and the specialty pharmacy should contact him regarding shipment per Glade Stanford., [facility].

## 2011-04-18 ENCOUNTER — Encounter: Payer: Self-pay | Admitting: Internal Medicine

## 2011-04-18 ENCOUNTER — Ambulatory Visit (INDEPENDENT_AMBULATORY_CARE_PROVIDER_SITE_OTHER): Payer: 59 | Admitting: Internal Medicine

## 2011-04-18 ENCOUNTER — Telehealth: Payer: Self-pay | Admitting: Internal Medicine

## 2011-04-18 VITALS — BP 118/64 | HR 82 | Ht 68.0 in | Wt 181.8 lb

## 2011-04-18 DIAGNOSIS — J4489 Other specified chronic obstructive pulmonary disease: Secondary | ICD-10-CM

## 2011-04-18 DIAGNOSIS — J45909 Unspecified asthma, uncomplicated: Secondary | ICD-10-CM

## 2011-04-18 DIAGNOSIS — J449 Chronic obstructive pulmonary disease, unspecified: Secondary | ICD-10-CM

## 2011-04-18 MED ORDER — ALBUTEROL SULFATE HFA 108 (90 BASE) MCG/ACT IN AERS: INHALATION_SPRAY | RESPIRATORY_TRACT | Status: DC

## 2011-04-18 MED ORDER — THEOPHYLLINE ER 200 MG PO TB12: ORAL_TABLET | ORAL | Status: DC

## 2011-04-18 MED ORDER — PREDNISONE 10 MG PO TABS: ORAL_TABLET | ORAL | Status: DC

## 2011-04-18 MED ORDER — ARFORMOTEROL TARTRATE 15 MCG/2ML IN NEBU
15.0000 ug | INHALATION_SOLUTION | Freq: Once | RESPIRATORY_TRACT | Status: AC
  Administered 2011-04-18: 15 ug via RESPIRATORY_TRACT

## 2011-04-18 MED ORDER — ALBUTEROL SULFATE (2.5 MG/3ML) 0.083% IN NEBU: 2.5000 mg | INHALATION_SOLUTION | Freq: Four times a day (QID) | RESPIRATORY_TRACT | Status: DC | PRN

## 2011-04-18 NOTE — Telephone Encounter (Signed)
Called and spoke with pts wife Eric [name] and she stated that the pt has been trying to wean down the prednisone--had been on 30mg  daily and started on Sunday with 15mg  daily.  She stated that he is having a difficult time with his breathing and is still on the same other meds.  Is wanting to be seen today.  No openings today.  CY please advise.  thanks

## 2011-04-18 NOTE — Patient Instructions (Signed)
Eric [name]   See if this lasts enough longer that it would be helpful to have this as a nebulizer med at home.  Try Theophylline- script sent-   Key side effects are nausea, arrythmia, seizures  Script sent for prednisone 10 mg tabs  Order- referral [facility] -  Dx severe asthma  Sample Dulera 200 to use 2 puffs and rinse mouth twice daily- instead of Advair for trial

## 2011-04-18 NOTE — Progress Notes (Signed)
Patient ID: Eric [name], male    DOB: 13-Sep-1955, 56 y.o.   MRN: [id number]  HPI 4/ 3/12- 4 yo never smoker with hx chronic obstructive asthma, now on Xolair. Xolair is being dosed 150 mg per month, but he feels it wearing off by 3 weeks and asks about a dosing change. He remains on prednisone 5 mg daily. At 3 weeks after a xolair dose he begins to feel tighter, more wheeze. He had to miss work today because of asthma and he increased prednisone today to 20 mg. . He denies obvious change with seasonal pollens- no itch, sneeze,watery eyes.   10/24/10- 72 yo never smoker with hx chronic obstructive asthma, now on Xolair. Has continued Xoalir monthly. One injection/ month lasted few weeks-not a month. Increased to 2 injections/ month, same total dose,  and not as clearly helping. Has continued chronic maintenance prednisone. We discussed allergic vs nonallergic triggers. He is watching what happens as we enter Fall season. He had to miss work due to asthma flare 2 days ago and was aggresively using neb. Trigger was lawnmower smoke in garage.  Today is great. Currently on prednisone 20 mg daily. He remembers bone density check by Dr [name] within last few years.  We discussed Daliresp and he is interested in giving it another try.   04/18/11- 62 yo never smoker with hx chronic obstructive asthma, now on Xolair. He has been trying to get to lower maintenance prednisone dose and asked for higher Xolair dose. We increased him 300-> Xolair 375 mg [place] monthly.    Wife here  FOLLOWS FOR: been out of work for 3 days as of today-doesnt want to go up past prednisone 15mg  (was on 30mg ),using Zyflo. Wheezing increased-getting up in middle of night to do breathing tx's.   He was on prednisone 40 mg daily for several weeks before trying to drop down. Using nebulizer at night. Concerned about increased body fat from prednisone. We have had many long conversations about steroid side effects and alternatives over  the years. Theophylline did work when he was in college. Office spirometry: Severe obstructive airways disease. FEV1 1.29/35%, FEV1/FEC 0.46.  Review of Systems-See HPI Constitutional:   No weight loss, night sweats,  Fevers, chills, fatigue, lassitude. HEENT:   No headaches,  Difficulty swallowing,  Tooth/dental problems,  Sore throat,                No sneezing, itching, ear ache,  +nasal congestion, post nasal drip,  CV:  No chest pain,  Orthopnea, PND, swelling in lower extremities, anasarca, dizziness, palpitations GI  No heartburn, indigestion, abdominal pain, nausea, vomiting, diarrhea, change in bowel habits, loss of appetite Resp: t.  No excess mucus, no productive cough,  + non-productive cough,  No coughing up of blood.  No change in color of mucus.  No wheezing. Skin: no rash or lesions. GU:  MS:  No joint pain or swelling.  No decreased range of motion.  No back pain. Psych:  No change in mood or affect. No depression or anxiety.  No memory loss.     Objective:   Physical Exam General- Alert, Oriented, Affect-appropriate, Distress- none acute, mildly cushingoid Skin- rash-none, lesions- none, excoriation- none Lymphadenopathy- none Head- atraumatic            Eyes- Gross vision intact, PERRLA, conjunctivae- clear secretions            Ears- Hearing, canals normal  Nose- Clear, no-Septal dev, mucus, polyps, erosion, perforation             Throat- Mallampati II , mucosa clear , drainage- none, tonsils- atrophic Neck- flexible , trachea midline, no stridor , thyroid nl, carotid no bruit Chest - symmetrical excursion , unlabored           Heart/CV- RRR , no murmur , no gallop  , no rub, nl s1 s2                           - JVD- none , edema- none, stasis changes- none, varices- none           Lung- I&E wheeze;    cough- none , dullness-none, rub- none           Chest wall-  Abd- Br/ Gen/ Rectal- Not done, not indicated Extrem- cyanosis- none, clubbing, none,  atrophy- none, strength- nl Neuro- grossly intact to observation

## 2011-04-18 NOTE — Telephone Encounter (Signed)
Pt's wife returned call.  Holly D Pryor ° °

## 2011-04-18 NOTE — Telephone Encounter (Signed)
Laura-patients wife is aware that I have made appt for 130pm with CY today.

## 2011-04-18 NOTE — Telephone Encounter (Signed)
lmomtcb for pt 

## 2011-04-21 NOTE — Assessment & Plan Note (Signed)
Very severe chronic obstructive pulmonary disease. He is MM  alpha 1 antitrypsin. Never smoked. Etiology is presumed to be chronic fixed asthma. I talked with him about options. For the short term we can try pushing theophylline level. He remembers that as a helpful medicine in college. I talked with him about referral to [facility] for consideration of thermoplasty.. Unless we can get his lungs to open up, he may not meet entry criteria because of his low FEV1. We will begin by asking a second opinion consultation with [facility]. I expect them to want to repeat their own imaging studies and pulmonary function testing so I won'tl do that now.

## 2011-04-28 ENCOUNTER — Telehealth: Payer: Self-pay | Admitting: Internal Medicine

## 2011-04-28 MED ORDER — ARFORMOTEROL TARTRATE 15 MCG/2ML IN NEBU: 15.0000 ug | INHALATION_SOLUTION | Freq: Two times a day (BID) | RESPIRATORY_TRACT | Status: DC

## 2011-04-28 NOTE — Telephone Encounter (Signed)
Per CY-okay to Rx Brovana #60 1 bid prn with prn refills.

## 2011-04-28 NOTE — Telephone Encounter (Signed)
I spoke with laura and she is requesting an rx for brovana to be called in for pt. She stated it's hard to tell if it helps pt or not. She stated he had try'd this x 1 month. Please advise [name], thanks

## 2011-04-28 NOTE — Telephone Encounter (Signed)
I spoke with spouse and is aware rx was sent into [facility] [address]. She voiced her understanding and had no questions

## 2011-05-06 ENCOUNTER — Telehealth: Payer: Self-pay | Admitting: Internal Medicine

## 2011-05-06 MED ORDER — FLUCONAZOLE 150 MG PO TABS: 150.0000 mg | ORAL_TABLET | Freq: Every day | ORAL | Status: AC

## 2011-05-06 NOTE — Telephone Encounter (Signed)
Per CY-okay to give Diflucan 150 mg #3 take 1 qd x 3 days no refills. Pt wife is aware of Rx sent and if no better then call he office.

## 2011-05-06 NOTE — Telephone Encounter (Signed)
Wife aware that we will ask CY what he would like to call in for patient.

## 2011-05-12 ENCOUNTER — Ambulatory Visit: Payer: 59 | Admitting: Internal Medicine

## 2011-05-12 ENCOUNTER — Ambulatory Visit (INDEPENDENT_AMBULATORY_CARE_PROVIDER_SITE_OTHER): Payer: 59

## 2011-05-12 ENCOUNTER — Ambulatory Visit: Payer: 59

## 2011-05-12 DIAGNOSIS — J45909 Unspecified asthma, uncomplicated: Secondary | ICD-10-CM

## 2011-05-12 MED ORDER — OMALIZUMAB 150 MG ~~LOC~~ SOLR
375.0000 mg | Freq: Once | SUBCUTANEOUS | Status: AC
  Administered 2011-05-12: 375 mg via SUBCUTANEOUS

## 2011-05-14 ENCOUNTER — Telehealth: Payer: Self-pay | Admitting: Internal Medicine

## 2011-05-14 NOTE — Telephone Encounter (Signed)
Made in error

## 2011-05-19 ENCOUNTER — Telehealth: Payer: Self-pay | Admitting: Internal Medicine

## 2011-05-19 NOTE — Telephone Encounter (Signed)
FYI-will send to CY and sign off on phone note.

## 2011-05-23 ENCOUNTER — Ambulatory Visit: Payer: 59 | Admitting: Internal Medicine

## 2011-06-09 ENCOUNTER — Ambulatory Visit (INDEPENDENT_AMBULATORY_CARE_PROVIDER_SITE_OTHER): Payer: 59

## 2011-06-09 DIAGNOSIS — J45909 Unspecified asthma, uncomplicated: Secondary | ICD-10-CM

## 2011-06-11 DIAGNOSIS — J45909 Unspecified asthma, uncomplicated: Secondary | ICD-10-CM

## 2011-06-11 MED ORDER — OMALIZUMAB 150 MG ~~LOC~~ SOLR
375.0000 mg | Freq: Once | SUBCUTANEOUS | Status: AC
  Administered 2011-06-11: 375 mg via SUBCUTANEOUS

## 2011-06-23 ENCOUNTER — Encounter: Payer: Self-pay | Admitting: Internal Medicine

## 2011-06-23 ENCOUNTER — Ambulatory Visit (INDEPENDENT_AMBULATORY_CARE_PROVIDER_SITE_OTHER): Payer: 59 | Admitting: Internal Medicine

## 2011-06-23 DIAGNOSIS — J449 Chronic obstructive pulmonary disease, unspecified: Secondary | ICD-10-CM

## 2011-06-23 MED ORDER — PREDNISONE 1 MG PO TABS: ORAL_TABLET | ORAL | Status: DC

## 2011-06-23 NOTE — Patient Instructions (Signed)
Script sent for 1 mg prednisone tabs to allow fine-tuning of dose around your 10/5 level as tolerated. Suggest dose changes no faster than once per week.

## 2011-06-23 NOTE — Progress Notes (Signed)
Patient ID: Eric [name], male    DOB: 07-Sep-1955, 56 y.o.   MRN: [id number]  HPI 4/ 3/12- 4 yo never smoker with hx chronic obstructive asthma, now on Xolair. Xolair is being dosed 150 mg per month, but he feels it wearing off by 3 weeks and asks about a dosing change. He remains on prednisone 5 mg daily. At 3 weeks after a xolair dose he begins to feel tighter, more wheeze. He had to miss work today because of asthma and he increased prednisone today to 20 mg. . He denies obvious change with seasonal pollens- no itch, sneeze,watery eyes.   10/24/10- 9 yo never smoker with hx chronic obstructive asthma, now on Xolair. Has continued Xoalir monthly. One injection/ month lasted few weeks-not a month. Increased to 2 injections/ month, same total dose,  and not as clearly helping. Has continued chronic maintenance prednisone. We discussed allergic vs nonallergic triggers. He is watching what happens as we enter Fall season. He had to miss work due to asthma flare 2 days ago and was aggresively using neb. Trigger was lawnmower smoke in garage.  Today is great. Currently on prednisone 20 mg daily. He remembers bone density check by Dr [name] within last few years.  We discussed Daliresp and he is interested in giving it another try.   04/18/11- 52 yo never smoker with hx chronic obstructive asthma, now on Xolair. He has been trying to get to lower maintenance prednisone dose and asked for higher Xolair dose. We increased him 300-> Xolair 375 mg [place] monthly.    Wife here  FOLLOWS FOR: been out of work for 3 days as of today-doesnt want to go up past prednisone 15mg  (was on 30mg ),using Zyflo. Wheezing increased-getting up in middle of night to do breathing tx's.   He was on prednisone 40 mg daily for several weeks before trying to drop down. Using nebulizer at night. Concerned about increased body fat from prednisone. We have had many long conversations about steroid side effects and alternatives over  the years. Theophylline did work when he was in college. Office spirometry: Severe obstructive airways disease. FEV1 1.29/35%, FEV1/FEC 0.46.  06/23/11- 76 yo never smoker with hx chronic obstructive asthma, now on Xolair. We had referred him to do, with the expectation he might be considered for thermal bronchoscopy to treat his asthma. They're treating him for reflux with Prilosec twice daily. He feels a Xolair 375 mg is helping after 2 doses, the way the first few doses did at 275 mg. He was just able to reduce his prednisone to 5 mg daily with mild residual wheeze. We reviewed findings associated with steroid withdrawal and adrenal insufficiency and discussed endocrinology referral to help with this issue.  Review of Systems-See HPI Constitutional:   No weight loss, night sweats,  Fevers, chills, fatigue, lassitude. HEENT:   No headaches,  Difficulty swallowing,  Tooth/dental problems,  Sore throat,                No sneezing, itching, ear ache,  +nasal congestion, post nasal drip,  CV:  No chest pain,  Orthopnea, PND, swelling in lower extremities, anasarca, dizziness, palpitations GI  No heartburn, indigestion, abdominal pain, nausea, vomiting, diarrhea, change in bowel habits, loss of appetite Resp:   No excess mucus, no productive cough,  + non-productive cough,  No coughing up of blood.  No change in color of mucus.  + wheezing. Skin: no rash or lesions. GU:  MS:  No  joint pain or swelling.  No decreased range of motion.  No back pain. Psych:  No change in mood or affect. No depression or anxiety.  No memory loss.     Objective:   Physical Exam General- Alert, Oriented, Affect-appropriate, Distress- none acute, mildly cushingoid Skin- rash-none, lesions- none, excoriation- none Lymphadenopathy- none Head- atraumatic            Eyes- Gross vision intact, PERRLA, conjunctivae- clear secretions            Ears- Hearing, canals normal            Nose- Clear, no-Septal dev, mucus,  polyps, erosion, perforation             Throat- Mallampati II , mucosa clear , drainage- none, tonsils- atrophic Neck- flexible , trachea midline, no stridor , thyroid nl, carotid no bruit Chest - symmetrical excursion , unlabored           Heart/CV- RRR , no murmur , no gallop  , no rub, nl s1 s2                           - JVD- none , edema- none, stasis changes- none, varices- none           Lung-  Mild I&E wheeze;    cough- none , dullness-none, rub- none           Chest wall-  Abd- Br/ Gen/ Rectal- Not done, not indicated Extrem- cyanosis- none, clubbing, none, atrophy- none, strength- nl Neuro- grossly intact to observation

## 2011-06-26 ENCOUNTER — Telehealth: Payer: Self-pay | Admitting: Internal Medicine

## 2011-06-26 DIAGNOSIS — E2740 Unspecified adrenocortical insufficiency: Secondary | ICD-10-CM

## 2011-06-26 DIAGNOSIS — IMO0002 Reserved for concepts with insufficient information to code with codable children: Secondary | ICD-10-CM

## 2011-06-26 NOTE — Telephone Encounter (Signed)
Order- referral to endocrinology Dr Everardo All if available, or Dr Sharl Ma, for adrenal insufficiency/ chronic steroids

## 2011-06-26 NOTE — Telephone Encounter (Signed)
Called, spoke with pt's wife, Vernona Rieger, who reports [name] mentioned an endocrinologist referral.  They would now like to proceed with this.  [name], pls advise.  Thank you.

## 2011-06-26 NOTE — Telephone Encounter (Signed)
Order placed -- Vernona Rieger aware and will call back if anything further is needed.

## 2011-06-27 NOTE — Assessment & Plan Note (Signed)
He has tried to get down to 5 mg of prednisone daily. That may be too low, both for asthma control and for steroid needs. We discussed very slow wean using 1 mg tablets. He likely will need assessment for adrenal insufficiency.

## 2011-07-03 ENCOUNTER — Telehealth: Payer: Self-pay | Admitting: Internal Medicine

## 2011-07-03 NOTE — Telephone Encounter (Signed)
Spoke with [name] to clarify directions on arcapta.  Will forward msg to him to to clarify this.

## 2011-07-03 NOTE — Telephone Encounter (Signed)
1) I will support short term disability if he decides that is what he wants to do. 2) I would like him to try a 24 hour bronchodilator inhaler- Arcapta   Sample, 2 puffs, once daily.

## 2011-07-03 NOTE — Telephone Encounter (Signed)
Spoke with pt's spouse Vernona Rieger. She states that the pt has been taking 10 mg prednisone x 3-4 wks, and for the past 5 days has had some increased SOB, dry cough and wheeze- so this am he started taking 40 mg prednisone. She states that he is having to stay up at night to use his nebs and does not sleep well due to pred so this is taking a toll on him at work during the day. She states that she wants to know Dr Roxy Cedar thoughts on STD for the pt. They have not spoken with employer yet, but just want to know if Dr Maple Hudson thinks this is a good idea. Please advise, thanks!

## 2011-07-04 NOTE — Telephone Encounter (Signed)
[name], will you pls clarify on the arcapta instructions as per our conversation yesterday.  Thank you.

## 2011-07-04 NOTE — Telephone Encounter (Signed)
Dose for Arcapta- 1 inhalation capsule daily

## 2011-07-04 NOTE — Telephone Encounter (Signed)
Spoke with pt's spouse and notified of recs per [name]. She verbalized understanding and states nothing further needed for now. Sample of Artcapta was left up front for pick up.

## 2011-07-07 ENCOUNTER — Telehealth: Payer: Self-pay | Admitting: Internal Medicine

## 2011-07-07 ENCOUNTER — Ambulatory Visit (INDEPENDENT_AMBULATORY_CARE_PROVIDER_SITE_OTHER): Payer: 59

## 2011-07-07 DIAGNOSIS — J45909 Unspecified asthma, uncomplicated: Secondary | ICD-10-CM

## 2011-07-07 MED ORDER — OMALIZUMAB 150 MG ~~LOC~~ SOLR
300.0000 mg | Freq: Once | SUBCUTANEOUS | Status: AC
  Administered 2011-07-07: 300 mg via SUBCUTANEOUS

## 2011-07-07 MED ORDER — PREDNISONE 10 MG PO TABS: ORAL_TABLET | ORAL | Status: DC

## 2011-07-07 NOTE — Telephone Encounter (Signed)
I spoke with spouse and is aware of [name] recs. rx has been sent to the pharmacy

## 2011-07-07 NOTE — Telephone Encounter (Signed)
I spoke with spouse and she states pt has had to increase his prednisone to 35 mg-40 mg a day. Pt has prednisone 1 mg but is requesting refill on prednisone 10 mg. This was last filled 04/18/11 #100 x 0 fills. Please advise if okay to refill [name] , thanks   [facility] [address]

## 2011-07-07 NOTE — Telephone Encounter (Signed)
Per CY-not big difference; may be better in om if it doesn't keep him awake.

## 2011-07-07 NOTE — Telephone Encounter (Signed)
Per CY-okay to give #100 4 daily or as directed.

## 2011-07-07 NOTE — Telephone Encounter (Signed)
Pts wife wanted to know if there is any benefit of taking in the am compared to the pm for his predisone

## 2011-07-11 ENCOUNTER — Ambulatory Visit (INDEPENDENT_AMBULATORY_CARE_PROVIDER_SITE_OTHER): Payer: 59 | Admitting: Endocrinology

## 2011-07-11 ENCOUNTER — Other Ambulatory Visit (INDEPENDENT_AMBULATORY_CARE_PROVIDER_SITE_OTHER): Payer: 59

## 2011-07-11 ENCOUNTER — Encounter: Payer: Self-pay | Admitting: Endocrinology

## 2011-07-11 VITALS — BP 102/70 | HR 80 | Temp 97.8°F | Ht 68.0 in | Wt 180.0 lb

## 2011-07-11 DIAGNOSIS — R739 Hyperglycemia, unspecified: Secondary | ICD-10-CM

## 2011-07-11 DIAGNOSIS — R7309 Other abnormal glucose: Secondary | ICD-10-CM

## 2011-07-11 DIAGNOSIS — E279 Disorder of adrenal gland, unspecified: Secondary | ICD-10-CM

## 2011-07-11 DIAGNOSIS — N209 Urinary calculus, unspecified: Secondary | ICD-10-CM | POA: Insufficient documentation

## 2011-07-11 LAB — HEMOGLOBIN A1C: Hgb A1c MFr Bld: 5.5 % (ref 4.6–6.5)

## 2011-07-11 NOTE — Patient Instructions (Addendum)
You should have your blood sugar checked each year. blood tests are being requested for you today.  You will receive a letter with results. "adrenal insufficiency" starts only when you go below 5 mg of prednisone per day.  If you are able to go below this amount, please come back.

## 2011-07-11 NOTE — Progress Notes (Signed)
Subjective:    Patient ID: Eric [name], male    DOB: [birth year], 56 y.o.   MRN: [id number]  HPI Pt says he has been on and off prednisone for many years, for asthma.  Most recently, he has been on prednisone steadily for several years.  He has been on at least 5 mg qd but he has often needed much more.   He has few years of slight weight gain at the anterior abdomen, and assoc fat at the upper back. He was also noted to have osteoporosis.  He gets fatigue when he tries to taper off prednisone. Past Medical History  Diagnosis Date  . Unspecified sinusitis (chronic)   . Allergic rhinitis, cause unspecified   . Chronic airway obstruction, not elsewhere classified   . Unspecified asthma     start Xolair 03-2010  . Lung nodule     13x18mm posterior RLL CT 06-27-09  . GERD (gastroesophageal reflux disease)   . History of kidney stones     Past Surgical History  Procedure Date  . Lobectomy age 89    Right middle with tracheostomy  . Sympathectomy     Right carotid body removed for asthma  . Ventilator dependent respiratory failure 1989    asthma   . Many asthma hospitalizations   . Nasal sinus surgery 1990    x 2  . Submandibular salivary glands removed 2012    History   Social History  . Marital Status: Married    Spouse Name: N/A    Number of Children: N/A  . Years of Education: 16   Occupational History  . medical lab tech   . MEDICAL [employer]    Social History Main Topics  . Smoking status: Never Smoker   . Smokeless tobacco: Not on file  . Alcohol Use: Yes     rarely  . Drug Use: No  . Sexually Active: Not on file   Other Topics Concern  . Not on file   Social History Narrative   Regular exercise-noCaffeine use-yes    Current Outpatient Prescriptions on File Prior to Visit  Medication Sig Dispense Refill  . albuterol (PROVENTIL) (2.5 MG/3ML) 0.083% nebulizer solution Take 3 mLs (2.5 mg total) by nebulization every 6 (six) hours as needed.  360 mL  3  .  albuterol (PROVENTIL) 4 MG tablet TAKE 1 TABLET BY MOUTH TWICE A DAY AS NEEDED  60 tablet  1  . albuterol (VENTOLIN HFA) 108 (90 BASE) MCG/ACT inhaler 2 puffs four times daily as needed  3 Inhaler  3  . arformoterol (BROVANA) 15 MCG/2ML NEBU Take 2 mLs (15 mcg total) by nebulization 2 (two) times daily. Dx 496  120 mL  prn  . Calcium Carbonate-Vitamin D (CALCIUM 600+D) 600-200 MG-UNIT TABS Take 1 tablet by mouth 2 (two) times daily.        . citalopram (CELEXA) 40 MG tablet Take 40 mg by mouth daily.       Marland Kitchen EPINEPHrine (EPIPEN) 0.3 mg/0.3 mL DEVI Use as directed as needed for severe allergic reactions.       . fluticasone (FLONASE) 50 MCG/ACT nasal spray 2 sprays by Nasal route daily.        . Fluticasone-Salmeterol (ADVAIR DISKUS) 250-50 MCG/DOSE AEPB Inhale 1 puff into the lungs every 12 (twelve) hours.        Marland Kitchen guaiFENesin (MUCINEX) 600 MG 12 hr tablet Take 600 mg by mouth 2 (two) times daily.        Marland Kitchen  ipratropium (ATROVENT) 0.02 % nebulizer solution Take 500 mcg by nebulization 4 (four) times daily.       Marland Kitchen omalizumab (XOLAIR) 150 MG injection Inject 375 mg into the skin every 28 (twenty-eight) days.  1 each  prn  . predniSONE (DELTASONE) 1 MG tablet 1 daily or as directed for weaning  200 tablet  0  . predniSONE (DELTASONE) 10 MG tablet 4 daily or As directed  100 tablet  0  . predniSONE (DELTASONE) 5 MG tablet Burst and taper as needed  200 tablet  1  . theophylline (THEODUR) 200 MG 12 hr tablet 1 with food, twice daily x 1 week, then 3 times daily x 1 week, then try taking 2, twice daily  100 tablet  2  . ZYFLO CR 600 MG CR tablet TAKE 2 TABLETS BY MOUTH TWICE A DAY  120 tablet  PRN    Allergies  Allergen Reactions  . Erythromycin Ethylsuccinate     REACTION: unspecified  . Penicillins     Family History  Problem Relation Age of Onset  . Cancer Mother     lymphoepithelial  . Asthma Father     allergic  . Asthma Brother     allergic  . Testicular cancer Maternal Grandfather     . Cancer Other     Grandfather-Prostate Cancer  no adrenal dz  BP 102/70  Pulse 80  Temp(Src) 97.8 F (36.6 C) (Oral)  Ht 5\' 8"  (1.727 m)  Wt 180 lb (81.647 kg)  BMI 27.37 kg/m2  SpO2 95%  Review of Systems denies fever, headache, erectile dysfunction, hair loss, polyuria, insomnia, hyperpigmentation, cramps, numbness, and rash on the abdomen.  He has excessive diaphoresis in the summer, thinning of the skin, easy bruising, cramps, muscle weakness, intermittent mild depression, and chronic doe.    Objective:   Physical Exam VS: see vs page GEN: no distress HEAD: head: no deformity eyes: no periorbital swelling, no proptosis external nose and ears are normal mouth: no lesion seen NECK: supple, thyroid is not enlarged.  Tracheostomy scar is present. CHEST WALL: no deformity.  Mildly prominent fat at the upper thoracic spine LUNGS: clear to auscultation, except for a few exp wheezes. CV: reg rate and rhythm, no murmur ABD: abdomen is soft, nontender.  no hepatosplenomegaly.  not distended.  no hernia.  No striae. MUSCULOSKELETAL: muscle bulk and strength are grossly normal.  no obvious joint swelling.  gait is normal and steady EXTEMITIES: no deformity of the hands. no edema of the legs. PULSES: no carotid bruit NEURO:  cn 2-12 grossly intact.   readily moves all 4's.  sensation is intact to touch on the feet SKIN:  Normal texture and temperature.  No rash or suspicious lesion is visible.   NODES:  None palpable at the neck PSYCH: alert, oriented x3.  Does not appear anxious nor depressed.  Lab Results  Component Value Date   PTH 34.2 07/11/2011   CALCIUM 10.3 07/11/2011   Lab Results  Component Value Date   HGBA1C 5.5 07/11/2011      Assessment & Plan:  Asthma.  He has a chronic need for steroids. Fatigue, and other sxs, due to reducing steroids, even at dosages far above a replacement need Weight gain, and other sxs as noted, prob due to steroids

## 2011-07-14 ENCOUNTER — Encounter: Payer: Self-pay | Admitting: Endocrinology

## 2011-07-14 ENCOUNTER — Telehealth: Payer: Self-pay | Admitting: *Deleted

## 2011-07-14 LAB — PTH, INTACT AND CALCIUM: Calcium, Total (PTH): 10.3 mg/dL (ref 8.4–10.5)

## 2011-07-14 NOTE — Telephone Encounter (Signed)
Called pt to inform of lab results, left message for pt to callback office (letter also mailed to pt). 

## 2011-07-15 NOTE — Telephone Encounter (Signed)
Pt's wife informed of results

## 2011-07-19 DIAGNOSIS — E279 Disorder of adrenal gland, unspecified: Secondary | ICD-10-CM | POA: Insufficient documentation

## 2011-07-29 ENCOUNTER — Other Ambulatory Visit: Payer: Self-pay | Admitting: Internal Medicine

## 2011-07-29 NOTE — Telephone Encounter (Signed)
Please advise if ok to refill, thanks 

## 2011-07-29 NOTE — Telephone Encounter (Signed)
Ok to refill prednisone 

## 2011-07-31 ENCOUNTER — Telehealth: Payer: Self-pay | Admitting: Internal Medicine

## 2011-07-31 NOTE — Telephone Encounter (Signed)
lmomtcb x1--fmla paperwork goes to [facility]

## 2011-08-01 NOTE — Telephone Encounter (Signed)
LMTCB

## 2011-08-05 ENCOUNTER — Ambulatory Visit (INDEPENDENT_AMBULATORY_CARE_PROVIDER_SITE_OTHER): Payer: 59

## 2011-08-05 DIAGNOSIS — J45909 Unspecified asthma, uncomplicated: Secondary | ICD-10-CM

## 2011-08-05 MED ORDER — OMALIZUMAB 150 MG ~~LOC~~ SOLR
375.0000 mg | Freq: Once | SUBCUTANEOUS | Status: AC
  Administered 2011-08-05: 375 mg via SUBCUTANEOUS

## 2011-08-05 NOTE — Telephone Encounter (Signed)
Spoke with pt's spouse and she states that she has already had this taken care of. Eric [name] called and let her know that the forms were faxed already this am. Nothing further needed.

## 2011-08-14 ENCOUNTER — Telehealth: Payer: Self-pay | Admitting: Internal Medicine

## 2011-08-14 NOTE — Telephone Encounter (Signed)
Made in error. Eric [name]  °

## 2011-08-20 ENCOUNTER — Telehealth: Payer: Self-pay | Admitting: Internal Medicine

## 2011-08-20 ENCOUNTER — Encounter: Payer: Self-pay | Admitting: Internal Medicine

## 2011-08-20 ENCOUNTER — Ambulatory Visit (INDEPENDENT_AMBULATORY_CARE_PROVIDER_SITE_OTHER): Payer: 59 | Admitting: Internal Medicine

## 2011-08-20 VITALS — BP 118/64 | HR 83 | Ht 68.0 in | Wt 183.4 lb

## 2011-08-20 DIAGNOSIS — J449 Chronic obstructive pulmonary disease, unspecified: Secondary | ICD-10-CM

## 2011-08-20 MED ORDER — METHYLPREDNISOLONE 8 MG PO TABS: ORAL_TABLET | ORAL | Status: DC

## 2011-08-20 NOTE — Patient Instructions (Addendum)
Script sent to try medrol/ methylprednisolone instead of prednisone.  The conversion is 8 mg medrol = 10 mg prednisone  Sample Arcapta 24 hour bronchodilator     1 daily You can still use either Brovana or albuterol neb if needed and not overstimulating You can use the ipratropium with either albuterol or Brovana   You can use your rescue inhaler if needed  When you go back to [facility] ask about new drugs- there is an antibody to an interferon being talked about.

## 2011-08-20 NOTE — Progress Notes (Signed)
Patient ID: Eric [name], male    DOB: [birth year], 56 y.o.   MRN: [id number]  HPI 4/ 3/12- 44 yo never smoker with hx chronic obstructive asthma, now on Xolair. Xolair is being dosed 150 mg per month, but he feels it wearing off by 3 weeks and asks about a dosing change. He remains on prednisone 5 mg daily. At 3 weeks after a xolair dose he begins to feel tighter, more wheeze. He had to miss work today because of asthma and he increased prednisone today to 20 mg. . He denies obvious change with seasonal pollens- no itch, sneeze,watery eyes.   10/24/10- 75 yo never smoker with hx chronic obstructive asthma, now on Xolair. Has continued Xoalir monthly. One injection/ month lasted few weeks-not a month. Increased to 2 injections/ month, same total dose,  and not as clearly helping. Has continued chronic maintenance prednisone. We discussed allergic vs nonallergic triggers. He is watching what happens as we enter Fall season. He had to miss work due to asthma flare 2 days ago and was aggresively using neb. Trigger was lawnmower smoke in garage.  Today is great. Currently on prednisone 20 mg daily. He remembers bone density check by Dr Lavone Orn within last few years.  We discussed Daliresp and he is interested in giving it another try.   04/18/11- 27 yo never smoker with hx chronic obstructive asthma, now on Xolair. He has been trying to get to lower maintenance prednisone dose and asked for higher Xolair dose. We increased him 300-> Xolair 375 mg  monthly.    Wife here  FOLLOWS FOR: been out of work for 3 days as of today-doesnt want to go up past prednisone 15mg  (was on 30mg ),using Zyflo. Wheezing increased-getting up in middle of night to do breathing tx's.   He was on prednisone 40 mg daily for several weeks before trying to drop down. Using nebulizer at night. Concerned about increased body fat from prednisone. We have had many long conversations about steroid side effects and alternatives over  the years. Theophylline did work when he was in college. Office spirometry: Severe obstructive airways disease. FEV1 1.29/35%, FEV1/FEC 0.46.  06/23/11- 56 yo never smoker with hx chronic obstructive asthma, now on Xolair. We had referred him to do, with the expectation he might be considered for thermal bronchoscopy to treat his asthma. They're treating him for reflux with Prilosec twice daily. He feels a Xolair 375 mg is helping after 2 doses, the way the first few doses did at 275 mg. He was just able to reduce his prednisone to 5 mg daily with mild residual wheeze. We reviewed findings associated with steroid withdrawal and adrenal insufficiency and discussed endocrinology referral to help with this issue.  08/20/11- 53 yo never smoker with hx chronic obstructive asthma, now on Xolair. Pt c/o increased SOB, chest tightness and prod cough x 1 week  Pt tx with Pred 30 mg x1 week then increased to 50mg  qd x 2days. Pt denies f/c/s.  Pt states even with the prednisone he cannot get relief. He has been on 30 mg of prednisone for most of this spring on a daily basis, usually with some wheeze but able to work. In the last day or 2 chest tightness and wheeze have been worse without specific reason. He had to increase prednisone to 50 mg daily. He continues Xolair injections. No recent colds or obvious exposures. Scant phlegm with trace yellow. He denies sinus problems. Taking Prilosec twice daily as  directed by the pulmonologist we sent him to at [name]. We sent him specifically for evaluation of thermoplasty. He apparently is a little outside of their selection criteria but they're considering him. He has missed work intermittently. There is no part-time work available but he feels unable to do a 40 hour work week anymore  Review of Systems-See HPI Constitutional:   No weight loss, night sweats,  Fevers, chills, fatigue, lassitude. HEENT:   No headaches,  Difficulty swallowing,  Tooth/dental problems,  Sore  throat,                No sneezing, itching, ear ache, No-nasal congestion, post nasal drip,  CV:  No chest pain,  Orthopnea, PND, swelling in lower extremities, anasarca, dizziness, palpitations GI  No heartburn, indigestion, abdominal pain, nausea, vomiting, Resp:   No excess mucus, no productive cough,  + non-productive cough,  No coughing up of blood.  No change in color of mucus.  + wheezing. Skin: no rash or lesions. GU:  MS:  No joint pain or swelling.  No decreased range of motion.  No back pain. Psych:  No change in mood or affect. No depression or anxiety.  No memory loss.     Objective:   Physical Exam General- Alert, Oriented, Affect-appropriate, Distress- none acute, mildly cushingoid??, Room air saturation 93% Skin- rash-none, lesions- none, excoriation- none Lymphadenopathy- none Head- atraumatic            Eyes- Gross vision intact, PERRLA, conjunctivae- clear secretions            Ears- Hearing, canals normal            Nose- Clear, no-Septal dev, mucus, polyps, erosion, perforation             Throat- Mallampati II , mucosa clear , drainage- none, tonsils- atrophic Neck- flexible , trachea midline, no stridor , thyroid nl, carotid no bruit Chest - symmetrical excursion , unlabored           Heart/CV- RRR , no murmur , no gallop  , no rub, nl s1 s2                           - JVD- none , edema- none, stasis changes- none, varices- none           Lung-  bilateral wheeze;    cough- none , dullness-none, rub- none           Chest wall-  Abd- Br/ Gen/ Rectal- Not done, not indicated Extrem- cyanosis- none, clubbing, none, atrophy- none, strength- nl Neuro- grossly intact to observation

## 2011-08-20 NOTE — Telephone Encounter (Signed)
Called, spoke with pt's wife, Eric [name], who states pt is having increased SOB, chest tightness, nonprod cough, and wheezing.  This has been going on "off and on for a while" but has started to worsen ago over the past 3 days.  Denies chest pain, f/c/s.  Increased prednisone to 50 mg yesterday.  Requesting to be worked in today.    Spoke with [employer].  Pt can be worked in today at 11:30 am -- Eric [name] aware and will inform pt.     Note:  Pt is scheduled for OV with [name] on Monday as well.  Eric [name] would like to keep this appt until pt is seen today to make sure it will not be needed.  If it is not needed, she will make sure they cancel it.

## 2011-08-24 ENCOUNTER — Other Ambulatory Visit: Payer: Self-pay | Admitting: Internal Medicine

## 2011-08-24 NOTE — Assessment & Plan Note (Signed)
Required steroid doses increasing. He is instructed to ask [facility] about research studies,thermoplasty, and anything else they may have to offer. Plan-sample Arcapta. Continues Xolair. Medication talk.

## 2011-08-25 ENCOUNTER — Ambulatory Visit: Payer: 59 | Admitting: Internal Medicine

## 2011-08-26 NOTE — Telephone Encounter (Signed)
Please advise if you would like to keep instructions the same or give new sig for Rx.

## 2011-08-28 ENCOUNTER — Telehealth: Payer: Self-pay | Admitting: Internal Medicine

## 2011-08-28 NOTE — Telephone Encounter (Signed)
OK to refill

## 2011-08-28 NOTE — Telephone Encounter (Signed)
Spoke with pt's spouse. She states that the pt is needing appt with [name] no later than 7-14- this is due to his STD claim. He has to have eval every 30 days. She states that he sees [facility] on 09-16-11 and thinks that it will be helpful to be seen the day after this. No openings in the schedule that will accommodate this. Please advise, thanks!

## 2011-08-28 NOTE — Telephone Encounter (Signed)
I spoke with patients wife-Laura-made appt for July 10,2013 at 11:15am.

## 2011-08-29 ENCOUNTER — Telehealth: Payer: Self-pay | Admitting: Internal Medicine

## 2011-08-29 NOTE — Telephone Encounter (Signed)
LMTCB

## 2011-08-29 NOTE — Telephone Encounter (Signed)
ATC PT NA unable to leave VM bc it has not been set up yet [facility] 

## 2011-08-29 NOTE — Telephone Encounter (Signed)
ATC PT still NA and not able to leave VM [facility]

## 2011-08-29 NOTE — Telephone Encounter (Signed)
Pharmacy requesting  Theophylline er 200 mg <> take 1 twice a day x 1wk then one 3 times a day x 1wk then  Try taking 2 twice day (with food). Allergies  Allergen Reactions  . Erythromycin Ethylsuccinate     REACTION: unspecified  . Penicillins    Dr Maple Hudson is this ok to fill also are the directions to stay the same. Please advise thank you .

## 2011-09-01 ENCOUNTER — Telehealth: Payer: Self-pay | Admitting: Internal Medicine

## 2011-09-01 MED ORDER — DOXYCYCLINE HYCLATE 100 MG PO TABS: ORAL_TABLET | ORAL | Status: DC

## 2011-09-01 MED ORDER — THEOPHYLLINE ER 200 MG PO TB12: 200.0000 mg | ORAL_TABLET | Freq: Two times a day (BID) | ORAL | Status: DC

## 2011-09-01 NOTE — Telephone Encounter (Signed)
lmomtcb x1 

## 2011-09-01 NOTE — Telephone Encounter (Signed)
I spoke with pt and he c/o cough w/ yellow phlem, nasal congestion, hoarseness, and slight pnd x Thursday. Denies any f/c/s/n/v. He is on pred 20 mg daily. He states doxy does well for him. Please advise [name] thanks  Allergies  Allergen Reactions  . Erythromycin Ethylsuccinate     REACTION: unspecified  . Penicillins       [facility] [address]

## 2011-09-01 NOTE — Telephone Encounter (Signed)
He has been on theophylline, so ok to refill as theophylline ER 200 mg, # 120,     2 twice daily with food.  Ref prn

## 2011-09-01 NOTE — Telephone Encounter (Signed)
Returning call.

## 2011-09-01 NOTE — Telephone Encounter (Signed)
Per CY-okay to give Doxycycline 100 mg #8 take 2 today then 1 daily no refills.  

## 2011-09-01 NOTE — Telephone Encounter (Signed)
I spoke with pt and is aware of [name] recs. rx has been sent to the pharmacy.

## 2011-09-01 NOTE — Telephone Encounter (Signed)
Dup message °

## 2011-09-01 NOTE — Telephone Encounter (Signed)
rx sent

## 2011-09-01 NOTE — Telephone Encounter (Signed)
[phone]--Patient wife calling back.  She states they did not receive abx over the weekend.

## 2011-09-02 ENCOUNTER — Ambulatory Visit (INDEPENDENT_AMBULATORY_CARE_PROVIDER_SITE_OTHER): Payer: 59

## 2011-09-02 DIAGNOSIS — J45909 Unspecified asthma, uncomplicated: Secondary | ICD-10-CM

## 2011-09-03 MED ORDER — OMALIZUMAB 150 MG ~~LOC~~ SOLR
375.0000 mg | Freq: Once | SUBCUTANEOUS | Status: AC
  Administered 2011-09-03: 375 mg via SUBCUTANEOUS

## 2011-09-05 ENCOUNTER — Telehealth: Payer: Self-pay | Admitting: Internal Medicine

## 2011-09-09 NOTE — Telephone Encounter (Signed)
CY and I looked through all chart notes and need patient to call us with certain information. I have LMTCB for patient to speak directly with myself.

## 2011-09-10 NOTE — Telephone Encounter (Signed)
Spoke with patient-he will need to call me back with specific date he was first out of work and projected date of return.Thanks.

## 2011-09-10 NOTE — Telephone Encounter (Signed)
Will forward to [name]'s in basket per her request.

## 2011-09-10 NOTE — Telephone Encounter (Signed)
Pt returned [name]'s call.  Call him back @ [phone] Leanora Ivanoff

## 2011-09-15 NOTE — Telephone Encounter (Signed)
Fleet Contras of [employer] called & can be reached at [phone].  Would like to know the status.   Insured ID# [id number].  Antionette Fairy

## 2011-09-15 NOTE — Telephone Encounter (Signed)
Per [name] pt is calling her with more information

## 2011-09-16 NOTE — Telephone Encounter (Signed)
Eric [name], ext [phone] requests to speak w/ Eric [name].  Eric [name]

## 2011-09-16 NOTE — Telephone Encounter (Signed)
Eric [name] in [facility] aware that pt is being seen on 09-17-11 by CY and will get everything taken care of then.

## 2011-09-17 ENCOUNTER — Ambulatory Visit (INDEPENDENT_AMBULATORY_CARE_PROVIDER_SITE_OTHER): Payer: 59 | Admitting: Internal Medicine

## 2011-09-17 ENCOUNTER — Encounter: Payer: Self-pay | Admitting: Internal Medicine

## 2011-09-17 VITALS — BP 110/72 | HR 81 | Ht 68.0 in | Wt 180.8 lb

## 2011-09-17 DIAGNOSIS — J4489 Other specified chronic obstructive pulmonary disease: Secondary | ICD-10-CM

## 2011-09-17 DIAGNOSIS — J449 Chronic obstructive pulmonary disease, unspecified: Secondary | ICD-10-CM

## 2011-09-17 MED ORDER — INDACATEROL MALEATE 75 MCG IN CAPS: 1.0000 | ORAL_CAPSULE | RESPIRATORY_TRACT | Status: DC

## 2011-09-17 NOTE — Patient Instructions (Addendum)
Script Arcapta 24 hour bronchodilator   We will get you back in mid September, which is when your current 90 day short terrm disability coverage would expire.   Try elevating head of bed the height of one  Brick  Total IgE 66.4 on 12/17/09

## 2011-09-17 NOTE — Progress Notes (Signed)
Patient ID: AUM CAGGIANO, male    DOB: [birth year], 56 y.o.   MRN: [id number]  HPI 4/ 3/12- 44 yo never smoker with hx chronic obstructive asthma, now on Xolair. Xolair is being dosed 150 mg per month, but he feels it wearing off by 3 weeks and asks about a dosing change. He remains on prednisone 5 mg daily. At 3 weeks after a xolair dose he begins to feel tighter, more wheeze. He had to miss work today because of asthma and he increased prednisone today to 20 mg. . He denies obvious change with seasonal pollens- no itch, sneeze,watery eyes.   10/24/10- 75 yo never smoker with hx chronic obstructive asthma, now on Xolair. Has continued Xoalir monthly. One injection/ month lasted few weeks-not a month. Increased to 2 injections/ month, same total dose,  and not as clearly helping. Has continued chronic maintenance prednisone. We discussed allergic vs nonallergic triggers. He is watching what happens as we enter Fall season. He had to miss work due to asthma flare 2 days ago and was aggresively using neb. Trigger was lawnmower smoke in garage.  Today is great. Currently on prednisone 20 mg daily. He remembers bone density check by Dr Lavone Orn within last few years.  We discussed Daliresp and he is interested in giving it another try.   04/18/11- 27 yo never smoker with hx chronic obstructive asthma, now on Xolair. He has been trying to get to lower maintenance prednisone dose and asked for higher Xolair dose. We increased him 300-> Xolair 375 mg  monthly.    Wife here  FOLLOWS FOR: been out of work for 3 days as of today-doesnt want to go up past prednisone 15mg  (was on 30mg ),using Zyflo. Wheezing increased-getting up in middle of night to do breathing tx's.   He was on prednisone 40 mg daily for several weeks before trying to drop down. Using nebulizer at night. Concerned about increased body fat from prednisone. We have had many long conversations about steroid side effects and alternatives over  the years. Theophylline did work when he was in college. Office spirometry: Severe obstructive airways disease. FEV1 1.29/35%, FEV1/FEC 0.46.  06/23/11- 56 yo never smoker with hx chronic obstructive asthma, now on Xolair. We had referred him to do, with the expectation he might be considered for thermal bronchoscopy to treat his asthma. They're treating him for reflux with Prilosec twice daily. He feels a Xolair 375 mg is helping after 2 doses, the way the first few doses did at 275 mg. He was just able to reduce his prednisone to 5 mg daily with mild residual wheeze. We reviewed findings associated with steroid withdrawal and adrenal insufficiency and discussed endocrinology referral to help with this issue.  08/20/11- 53 yo never smoker with hx chronic obstructive asthma, now on Xolair. Pt c/o increased SOB, chest tightness and prod cough x 1 week  Pt tx with Pred 30 mg x1 week then increased to 50mg  qd x 2days. Pt denies f/c/s.  Pt states even with the prednisone he cannot get relief. He has been on 30 mg of prednisone for most of this spring on a daily basis, usually with some wheeze but able to work. In the last day or 2 chest tightness and wheeze have been worse without specific reason. He had to increase prednisone to 50 mg daily. He continues Xolair injections. No recent colds or obvious exposures. Scant phlegm with trace yellow. He denies sinus problems. Taking Prilosec twice daily as  directed by the pulmonologist we sent him to at [facility]. We sent him specifically for evaluation of thermoplasty. He apparently is a little outside of their selection criteria but they're considering him. He has missed work intermittently. There is no part-time work available but he feels unable to do a 40 hour work week anymore  09/17/11- 62 yo never smoker with hx chronic obstructive asthma, now on Xolair.  Pt states he has notices a lot of improvement while on the Pred taper (now on 10mg ). Pt c/o slight wheezing and  sob, but not too concerned. Pt denies chest tightness, prod cough.  Discussed his work with the pulmonologist at [facility]. They emphasize management of his GERD, demonstrated with barium swallow. They plan PFTs at his next visit so we won't do them here. He has been out of work now for 3 months. 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 He continues Brovana and Atrovent nebulizers twice daily with rare need now for rescue inhaler. He did like Arcapta and we compared that to his nebulizer. He failed Singulair and Zyflo.  Review of Systems-See HPI Constitutional:   No weight loss, night sweats,  Fevers, chills, fatigue, lassitude. HEENT:   No headaches,  Difficulty swallowing,  Tooth/dental problems,  Sore throat,                No sneezing, itching, ear ache, No-nasal congestion, post nasal drip,  CV:  No chest pain, orthopnea, PND, swelling in lower extremities, anasarca, dizziness, palpitations GI  No heartburn, indigestion, abdominal pain, nausea, vomiting, Resp:   No excess mucus, no productive cough,  + non-productive cough,  No coughing up of blood.  No change in color of mucus.  + wheezing. Skin: no rash or lesions. GU:  MS:  No joint pain or swelling. Marland Kitchen Psych:  No change in mood or affect. No depression or anxiety.  No memory loss.     Objective:   Physical Exam General- Alert, Oriented, Affect-appropriate, Distress- none acute, mildly cushingoid??- full cheeks, Room air saturation 93% Skin- rash-none, lesions- none, excoriation- none Lymphadenopathy- none Head- atraumatic            Eyes- Gross vision intact, PERRLA, conjunctivae- clear secretions            Ears- Hearing, canals normal            Nose- Clear, no-Septal dev, mucus, polyps, erosion, perforation             Throat- Mallampati II , mucosa clear , drainage- none, tonsils- atrophic. Tongue slightly coated. Neck- flexible , trachea midline, no stridor , thyroid nl, carotid no  bruit Chest - symmetrical excursion , unlabored           Heart/CV- RRR , no murmur , no gallop  , no rub, nl s1 s2                           - JVD- none , edema- none, stasis changes- none, varices- none           Lung-  trace wheeze;    cough- none , dullness-none, rub- none           Chest wall-  Abd- Br/ Gen/ Rectal- Not done, not indicated Extrem- cyanosis- none, clubbing, none, atrophy- none, strength- nl Neuro- grossly intact to observation

## 2011-09-22 ENCOUNTER — Telehealth: Payer: Self-pay | Admitting: *Deleted

## 2011-09-22 ENCOUNTER — Telehealth: Payer: Self-pay | Admitting: Internal Medicine

## 2011-09-22 NOTE — Telephone Encounter (Signed)
LMTCB

## 2011-09-22 NOTE — Telephone Encounter (Signed)
Spouse returned call. [phone]. Hazel Sams

## 2011-09-22 NOTE — Telephone Encounter (Signed)
Pt.'s wife called and left a message that Mr.Cooksey was going to stop his xolair shots for now. She wants [place] to know so we won't order anymore and keep his Rx for now. I immediately called her back;Mr.Baxley answered the ph. He said Dr.Young & and his Dr.s at [facility] said it was O.K.  I couldn't find any mention of this in Dr.Young's notes.

## 2011-09-23 ENCOUNTER — Other Ambulatory Visit: Payer: Self-pay | Admitting: Internal Medicine

## 2011-09-23 NOTE — Assessment & Plan Note (Signed)
Severe chronic asthma with a fixed component in a never smoker. 3 months of medical leave from his work was noted by his [facility] pulmonologist as well as by [place], to help with his asthma control. He is needing much less prednisone. He does use Qvar as well.

## 2011-09-23 NOTE — Telephone Encounter (Signed)
I spoke with pt spouse and she states that they were contacted by [facility] and told that additional ifo is needed for pt disability extension. I advised I will call [employer] and check on this. I spoke with Luster Landsberg and she states she has the form and will take care of what is needed. Pt spouse is aware. Carron Curie, CMA

## 2011-09-24 NOTE — Telephone Encounter (Signed)
Ok to D/C Xolair

## 2011-10-20 ENCOUNTER — Ambulatory Visit: Payer: 59 | Admitting: Internal Medicine

## 2011-10-22 ENCOUNTER — Telehealth: Payer: Self-pay | Admitting: Internal Medicine

## 2011-10-22 MED ORDER — INDACATEROL MALEATE 75 MCG IN CAPS: 1.0000 | ORAL_CAPSULE | RESPIRATORY_TRACT | Status: DC

## 2011-10-22 NOTE — Telephone Encounter (Signed)
Pt's wife is aware that RX sent to [facility].

## 2011-11-06 ENCOUNTER — Telehealth: Payer: Self-pay | Admitting: Internal Medicine

## 2011-11-06 NOTE — Telephone Encounter (Signed)
I spoke with the pt wife and she states the pt disability runs out on 11-19-11. He has a f/u with CY on 11-18-11 but they are concerned that this will not give them enough time to submit an extension if needed so appt r/s to 11-07-11. Carron Curie, CMA

## 2011-11-07 ENCOUNTER — Encounter: Payer: Self-pay | Admitting: Internal Medicine

## 2011-11-07 ENCOUNTER — Ambulatory Visit (INDEPENDENT_AMBULATORY_CARE_PROVIDER_SITE_OTHER): Payer: 59 | Admitting: Internal Medicine

## 2011-11-07 VITALS — BP 138/84 | HR 87 | Ht 68.0 in | Wt 180.0 lb

## 2011-11-07 DIAGNOSIS — J4489 Other specified chronic obstructive pulmonary disease: Secondary | ICD-10-CM

## 2011-11-07 DIAGNOSIS — J449 Chronic obstructive pulmonary disease, unspecified: Secondary | ICD-10-CM

## 2011-11-07 NOTE — Progress Notes (Signed)
Patient ID: AUM CAGGIANO, male    DOB: [birth year], 56 y.o.   MRN: [id number]  HPI 4/ 3/12- 44 yo never smoker with hx chronic obstructive asthma, now on Xolair. Xolair is being dosed 150 mg per month, but he feels it wearing off by 3 weeks and asks about a dosing change. He remains on prednisone 5 mg daily. At 3 weeks after a xolair dose he begins to feel tighter, more wheeze. He had to miss work today because of asthma and he increased prednisone today to 20 mg. . He denies obvious change with seasonal pollens- no itch, sneeze,watery eyes.   10/24/10- 75 yo never smoker with hx chronic obstructive asthma, now on Xolair. Has continued Xoalir monthly. One injection/ month lasted few weeks-not a month. Increased to 2 injections/ month, same total dose,  and not as clearly helping. Has continued chronic maintenance prednisone. We discussed allergic vs nonallergic triggers. He is watching what happens as we enter Fall season. He had to miss work due to asthma flare 2 days ago and was aggresively using neb. Trigger was lawnmower smoke in garage.  Today is great. Currently on prednisone 20 mg daily. He remembers bone density check by Dr Lavone Orn within last few years.  We discussed Daliresp and he is interested in giving it another try.   04/18/11- 27 yo never smoker with hx chronic obstructive asthma, now on Xolair. He has been trying to get to lower maintenance prednisone dose and asked for higher Xolair dose. We increased him 300-> Xolair 375 mg  monthly.    Wife here  FOLLOWS FOR: been out of work for 3 days as of today-doesnt want to go up past prednisone 15mg  (was on 30mg ),using Zyflo. Wheezing increased-getting up in middle of night to do breathing tx's.   He was on prednisone 40 mg daily for several weeks before trying to drop down. Using nebulizer at night. Concerned about increased body fat from prednisone. We have had many long conversations about steroid side effects and alternatives over  the years. Theophylline did work when he was in college. Office spirometry: Severe obstructive airways disease. FEV1 1.29/35%, FEV1/FEC 0.46.  06/23/11- 56 yo never smoker with hx chronic obstructive asthma, now on Xolair. We had referred him to do, with the expectation he might be considered for thermal bronchoscopy to treat his asthma. They're treating him for reflux with Prilosec twice daily. He feels a Xolair 375 mg is helping after 2 doses, the way the first few doses did at 275 mg. He was just able to reduce his prednisone to 5 mg daily with mild residual wheeze. We reviewed findings associated with steroid withdrawal and adrenal insufficiency and discussed endocrinology referral to help with this issue.  08/20/11- 53 yo never smoker with hx chronic obstructive asthma, now on Xolair. Pt c/o increased SOB, chest tightness and prod cough x 1 week  Pt tx with Pred 30 mg x1 week then increased to 50mg  qd x 2days. Pt denies f/c/s.  Pt states even with the prednisone he cannot get relief. He has been on 30 mg of prednisone for most of this spring on a daily basis, usually with some wheeze but able to work. In the last day or 2 chest tightness and wheeze have been worse without specific reason. He had to increase prednisone to 50 mg daily. He continues Xolair injections. No recent colds or obvious exposures. Scant phlegm with trace yellow. He denies sinus problems. Taking Prilosec twice daily as  directed by the pulmonologist we sent him to at [facility]. We sent him specifically for evaluation of thermoplasty. He apparently is a little outside of their selection criteria but they're considering him. He has missed work intermittently. There is no part-time work available but he feels unable to do a 40 hour work week anymore  09/17/11- 42 yo never smoker with hx chronic obstructive asthma, now on Xolair.  Pt states he has notices a lot of improvement while on the Pred taper (now on 10mg ). Pt c/o slight wheezing and  sob, but not too concerned. Pt denies chest tightness, prod cough.  Discussed his work with the pulmonologist at [facility]. They emphasize management of his GERD, demonstrated with barium swallow. They plan PFTs at his next visit so we won't do them here. He has been out of work now for 3 months. 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 He continues Brovana and Atrovent nebulizers twice daily with rare need now for rescue inhaler. He did like Arcapta and we compared that to his nebulizer. He failed Singulair and Zyflo.  11/07/11- 23 yo never smoker with hx chronic obstructive asthma, ended Xolair June, 2013.    Wife here. Still having increased SOB-currently on 8mg  Prednisone-stilll out of work. Wheezing as well and cough. Recent exacer- improved after doxy. Has been able to maintain on prednisone 8 mg daily but no lower. [facility] said not good candidate for thermoplasty. Short term disability through Sept 11.  Long discussion of options, meds. Will try Brovana neb in AM/ Arcapta in PM.    Review of Systems-See HPI Constitutional:   No weight loss, night sweats,  Fevers, chills, fatigue, lassitude. HEENT:   No headaches,  Difficulty swallowing,  Tooth/dental problems,  Sore throat,                No sneezing, itching, ear ache, No-nasal congestion, post nasal drip,  CV:  No chest pain, orthopnea, PND, swelling in lower extremities, anasarca, dizziness, palpitations GI  No heartburn, indigestion, abdominal pain, nausea, vomiting, Resp:   No excess mucus, no productive cough,  + non-productive cough,  No coughing up of blood.  No change in color of mucus.  + wheezing. Skin: no rash or lesions. GU:  MS:  No joint pain or swelling. Marland Kitchen Psych:  No change in mood or affect. No depression or anxiety.  No memory loss.     Objective:   Physical Exam BP 138/84  Pulse 87  Ht 5\' 8"  (1.727 m)  Wt 180 lb (81.647 kg)  BMI 27.37 kg/m2  SpO2 94% Room air  General-  Alert, Oriented, Affect-appropriate, Distress- none acute, mildly cushingoid??- full cheeks, Skin- rash-none, lesions- none, excoriation- none Lymphadenopathy- none Head- atraumatic            Eyes- Gross vision intact, PERRLA, conjunctivae- clear secretions            Ears- Hearing, canals normal            Nose- Clear, no-Septal dev, mucus, polyps, erosion, perforation             Throat- Mallampati II , mucosa clear , drainage- none, tonsils- atrophic. Tongue slightly coated. Neck- flexible , trachea midline, no stridor , thyroid nl, carotid no bruit Chest - symmetrical excursion , unlabored           Heart/CV- RRR , no murmur , no gallop  , no rub, nl s1 s2                           -  JVD- none , edema- none, stasis changes- none, varices- none           Lung-  + active I&E wheeze all fields- not laboring at rest.  cough- none , dullness-none, rub- none           Chest wall-  Abd- Br/ Gen/ Rectal- Not done, not indicated Extrem- cyanosis- none, clubbing, none, atrophy- none, strength- nl Neuro- grossly intact to observation

## 2011-11-07 NOTE — Patient Instructions (Addendum)
Recommend extension of disability until at least October 11.  Ok to continue prednisone 8 mg daily  Ok to try Arcapta when you have used up the B[place]am  Please call as needed

## 2011-11-17 NOTE — Assessment & Plan Note (Signed)
Recent acute exacerbation resolving after doxycycline, but still active wheeze. . Steroid dependent.  We will extend LOA 1 month at a time> Oct 11. It is hard for me to see him going back to work, but he is hopefull.  Stay on pred 8 mg/ daily, prn doxycycline. Ok to try Arcapta.

## 2011-11-18 ENCOUNTER — Telehealth: Payer: Self-pay | Admitting: Internal Medicine

## 2011-11-18 ENCOUNTER — Ambulatory Visit: Payer: 59 | Admitting: Internal Medicine

## 2011-11-18 NOTE — Telephone Encounter (Signed)
Please let patient know that we did get the papers and that CY filled out and sent back to [employer].

## 2011-11-18 NOTE — Telephone Encounter (Signed)
I spoke with spouse and is aware of this. She will call and speak with them. Nothing further was needed

## 2011-11-18 NOTE — Telephone Encounter (Signed)
[name] do you have anything on this pt. Please advise thanks

## 2011-11-25 ENCOUNTER — Other Ambulatory Visit: Payer: Self-pay | Admitting: Internal Medicine

## 2011-11-26 ENCOUNTER — Telehealth: Payer: Self-pay | Admitting: Internal Medicine

## 2011-11-26 MED ORDER — OMEPRAZOLE 20 MG PO CPDR: 20.0000 mg | DELAYED_RELEASE_CAPSULE | Freq: Two times a day (BID) | ORAL | Status: DC

## 2011-11-26 MED ORDER — BECLOMETHASONE DIPROPIONATE 80 MCG/ACT IN AERS: 2.0000 | INHALATION_SPRAY | Freq: Two times a day (BID) | RESPIRATORY_TRACT | Status: DC

## 2011-11-26 NOTE — Telephone Encounter (Signed)
Omeprazole not on med list; last ov note does mention GERD therapy rec'd by pt's pulmonologist at [facility].  Qvar last refilled 06/2011.  Per [name], ok to send omeprazole 90-days with no refills.  Pt will either needed to follow up w/ [facility] pulmonologist or CY will have to ok refills.  Called spoke with patient's wife Vernona Rieger.  She stated that pt was supposed to get refills at last ov.  Pt takes omeprazole 20mg  BID.  Refills sent - pt's wife aware.

## 2011-12-09 ENCOUNTER — Other Ambulatory Visit: Payer: Self-pay | Admitting: Internal Medicine

## 2011-12-09 DIAGNOSIS — R109 Unspecified abdominal pain: Secondary | ICD-10-CM

## 2011-12-10 ENCOUNTER — Ambulatory Visit (INDEPENDENT_AMBULATORY_CARE_PROVIDER_SITE_OTHER): Payer: 59 | Admitting: Internal Medicine

## 2011-12-10 ENCOUNTER — Encounter: Payer: Self-pay | Admitting: *Deleted

## 2011-12-10 ENCOUNTER — Encounter: Payer: Self-pay | Admitting: Internal Medicine

## 2011-12-10 ENCOUNTER — Ambulatory Visit
Admission: RE | Admit: 2011-12-10 | Discharge: 2011-12-10 | Disposition: A | Discharge status: Home / Self Care | Payer: 59 | Source: Ambulatory Visit | Attending: Internal Medicine | Admitting: Internal Medicine

## 2011-12-10 VITALS — BP 124/70 | HR 83 | Ht 68.0 in | Wt 180.2 lb

## 2011-12-10 DIAGNOSIS — R109 Unspecified abdominal pain: Secondary | ICD-10-CM

## 2011-12-10 DIAGNOSIS — Z23 Encounter for immunization: Secondary | ICD-10-CM

## 2011-12-10 DIAGNOSIS — J449 Chronic obstructive pulmonary disease, unspecified: Secondary | ICD-10-CM

## 2011-12-10 NOTE — Patient Instructions (Addendum)
Flu shot  Extend LOA from work another month until 01/12/12  Please call as needed

## 2011-12-10 NOTE — Progress Notes (Signed)
Patient ID: Eric [name], male    DOB: [birth year], 56 y.o.   MRN: [id number]  HPI 4/ 3/12- 44 yo never smoker with hx chronic obstructive asthma, now on Xolair. Xolair is being dosed 150 mg per month, but he feels it wearing off by 3 weeks and asks about a dosing change. He remains on prednisone 5 mg daily. At 3 weeks after a xolair dose he begins to feel tighter, more wheeze. He had to miss work today because of asthma and he increased prednisone today to 20 mg. . He denies obvious change with seasonal pollens- no itch, sneeze,watery eyes.   10/24/10- 75 yo never smoker with hx chronic obstructive asthma, now on Xolair. Has continued Xoalir monthly. One injection/ month lasted few weeks-not a month. Increased to 2 injections/ month, same total dose,  and not as clearly helping. Has continued chronic maintenance prednisone. We discussed allergic vs nonallergic triggers. He is watching what happens as we enter Fall season. He had to miss work due to asthma flare 2 days ago and was aggresively using neb. Trigger was lawnmower smoke in garage.  Today is great. Currently on prednisone 20 mg daily. He remembers bone density check by Dr Lavone Orn within last few years.  We discussed Daliresp and he is interested in giving it another try.   04/18/11- 27 yo never smoker with hx chronic obstructive asthma, now on Xolair. He has been trying to get to lower maintenance prednisone dose and asked for higher Xolair dose. We increased him 300-> Xolair 375 mg  monthly.    Wife here  FOLLOWS FOR: been out of work for 3 days as of today-doesnt want to go up past prednisone 15mg  (was on 30mg ),using Zyflo. Wheezing increased-getting up in middle of night to do breathing tx's.   He was on prednisone 40 mg daily for several weeks before trying to drop down. Using nebulizer at night. Concerned about increased body fat from prednisone. We have had many long conversations about steroid side effects and alternatives over  the years. Theophylline did work when he was in college. Office spirometry: Severe obstructive airways disease. FEV1 1.29/35%, FEV1/FEC 0.46.  06/23/11- 56 yo never smoker with hx chronic obstructive asthma, now on Xolair. We had referred him to do, with the expectation he might be considered for thermal bronchoscopy to treat his asthma. They're treating him for reflux with Prilosec twice daily. He feels a Xolair 375 mg is helping after 2 doses, the way the first few doses did at 275 mg. He was just able to reduce his prednisone to 5 mg daily with mild residual wheeze. We reviewed findings associated with steroid withdrawal and adrenal insufficiency and discussed endocrinology referral to help with this issue.  08/20/11- 53 yo never smoker with hx chronic obstructive asthma, now on Xolair. Pt c/o increased SOB, chest tightness and prod cough x 1 week  Pt tx with Pred 30 mg x1 week then increased to 50mg  qd x 2days. Pt denies f/c/s.  Pt states even with the prednisone he cannot get relief. He has been on 30 mg of prednisone for most of this spring on a daily basis, usually with some wheeze but able to work. In the last day or 2 chest tightness and wheeze have been worse without specific reason. He had to increase prednisone to 50 mg daily. He continues Xolair injections. No recent colds or obvious exposures. Scant phlegm with trace yellow. He denies sinus problems. Taking Prilosec twice daily as  directed by the pulmonologist we sent him to at [facility]. We sent him specifically for evaluation of thermoplasty. He apparently is a little outside of their selection criteria but they're considering him. He has missed work intermittently. There is no part-time work available but he feels unable to do a 40 hour work week anymore  09/17/11- 67 yo never smoker with hx chronic obstructive asthma, now on Xolair.  Pt states he has notices a lot of improvement while on the Pred taper (now on 10mg ). Pt c/o slight wheezing and  sob, but not too concerned. Pt denies chest tightness, prod cough.  Discussed his work with the pulmonologist at [facility]. They emphasize management of his GERD, demonstrated with barium swallow. They plan PFTs at his next visit so we won't do them here. He has been out of work now for 3 months. 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 He continues Brovana and Atrovent nebulizers twice daily with rare need now for rescue inhaler. He did like Arcapta and we compared that to his nebulizer. He failed Singulair and Zyflo.  11/07/11- 42 yo never smoker with hx chronic obstructive asthma, ended Xolair June, 2013.    Wife here. Still having increased SOB-currently on 8mg  Prednisone-stilll out of work. Wheezing as well and cough. Recent exacer- improved after doxy. Has been able to maintain on prednisone 8 mg daily but no lower. [facility] said not good candidate for thermoplasty. Short term disability through Sept 11.  Long discussion of options, meds. Will try Brovana neb in AM/ Arcapta in PM.   10/2/3- 48 yo never smoker with hx chronic obstructive asthma, ended Xolair June, 2013.    Wife here. Still having SOB and wheezing at times; still out of work. Can tolerate light to moderate exertion on some days, but on other days wheezing dyspnea is too limiting. He is pleased to be would remain on only 8 mg daily prednisone now for 2 months at that dose. We have carefully discussed long-term steroid side effects. He asks extension of leave of absence one more month and has applied for total and permanent disability. Now out of work x3 months. No longer being followed at [facility] pulmonary. COPD assessment test (CABG) score 22/40.  Review of Systems-See HPI Constitutional:   No weight loss, night sweats,  Fevers, chills, fatigue, lassitude. HEENT:   No headaches,  Difficulty swallowing,  Tooth/dental problems,  Sore throat,                No sneezing, itching, ear ache, No-nasal  congestion, post nasal drip,  CV:  No chest pain, orthopnea, PND, swelling in lower extremities, anasarca, dizziness, palpitations GI  No heartburn, indigestion, abdominal pain, nausea, vomiting, Resp:   No excess mucus, no productive cough,  + non-productive cough,  No coughing up of blood.  No change in color of mucus.  + wheezing. Skin: no rash or lesions. GU:  MS:  No joint pain or swelling. Marland Kitchen Psych:  No change in mood or affect. No depression or anxiety.  No memory loss.     Objective:   Physical Exam BP 124/70  Pulse 83  Ht 5\' 8"  (1.727 m)  Wt 180 lb 3.2 oz (81.738 kg)  BMI 27.40 kg/m2  SpO2 95% General- Alert, Oriented, Affect-appropriate, Distress- none acute, mildly cushingoid??- full cheeks, Skin- rash-none, lesions- none, excoriation- none Lymphadenopathy- none Head- atraumatic            Eyes- Gross vision intact,  PERRLA, conjunctivae- clear secretions            Ears- Hearing, canals normal            Nose- Clear, no-Septal dev, mucus, polyps, erosion, perforation             Throat- Mallampati II , mucosa clear , drainage- none, tonsils- atrophic.  Neck- flexible , trachea midline, no stridor , thyroid nl, carotid no bruit Chest - symmetrical excursion , unlabored           Heart/CV- RRR , no murmur , no gallop  , no rub, nl s1 s2                           - JVD- none , edema- none, stasis changes- none, varices- none           Lung-  + unlabored I&E wheeze all fields.  cough- none , dullness-none, rub- none           Chest wall-  Abd- Br/ Gen/ Rectal- Not done, not indicated Extrem- cyanosis- none, clubbing, none, atrophy- none, strength- nl Neuro- grossly intact to observation

## 2011-12-11 ENCOUNTER — Other Ambulatory Visit: Payer: 59

## 2011-12-20 NOTE — Assessment & Plan Note (Signed)
Steroid dependent chronic asthma with COPD, currently near baseline. I don't expect him to be able to return to work, but he is trying to remain active in hopes of building up his tolerance.

## 2011-12-30 ENCOUNTER — Telehealth: Payer: Self-pay | Admitting: Internal Medicine

## 2011-12-30 ENCOUNTER — Ambulatory Visit (HOSPITAL_COMMUNITY): Payer: 59

## 2011-12-30 ENCOUNTER — Encounter (HOSPITAL_COMMUNITY): Payer: Self-pay | Admitting: *Deleted

## 2011-12-30 ENCOUNTER — Ambulatory Visit (HOSPITAL_COMMUNITY)
Admission: RE | Admit: 2011-12-30 | Discharge: 2011-12-30 | Disposition: A | Discharge status: Home / Self Care | Payer: 59 | Source: Ambulatory Visit | Attending: Urology | Admitting: Urology

## 2011-12-30 ENCOUNTER — Encounter (HOSPITAL_COMMUNITY): Payer: Self-pay | Admitting: Anesthesiology

## 2011-12-30 ENCOUNTER — Other Ambulatory Visit: Payer: Self-pay | Admitting: Urology

## 2011-12-30 ENCOUNTER — Ambulatory Visit (HOSPITAL_COMMUNITY): Payer: 59 | Admitting: Anesthesiology

## 2011-12-30 ENCOUNTER — Encounter (HOSPITAL_COMMUNITY)
Admission: RE | Disposition: A | Discharge status: Home / Self Care | Payer: Self-pay | Source: Ambulatory Visit | Attending: Urology

## 2011-12-30 DIAGNOSIS — N200 Calculus of kidney: Secondary | ICD-10-CM | POA: Insufficient documentation

## 2011-12-30 DIAGNOSIS — K219 Gastro-esophageal reflux disease without esophagitis: Secondary | ICD-10-CM | POA: Insufficient documentation

## 2011-12-30 DIAGNOSIS — J449 Chronic obstructive pulmonary disease, unspecified: Secondary | ICD-10-CM | POA: Insufficient documentation

## 2011-12-30 DIAGNOSIS — J4489 Other specified chronic obstructive pulmonary disease: Secondary | ICD-10-CM | POA: Insufficient documentation

## 2011-12-30 DIAGNOSIS — N201 Calculus of ureter: Secondary | ICD-10-CM | POA: Insufficient documentation

## 2011-12-30 SURGERY — CYSTOURETEROSCOPY, WITH RETROGRADE PYELOGRAM AND STENT INSERTION
Anesthesia: General | Site: Ureter | Laterality: Bilateral | Wound class: Clean Contaminated

## 2011-12-30 MED ORDER — EPHEDRINE SULFATE 50 MG/ML IJ SOLN
INTRAMUSCULAR | Status: DC | PRN
  Administered 2011-12-30: 5 mg via INTRAVENOUS
  Administered 2011-12-30: 7.5 mg via INTRAVENOUS

## 2011-12-30 MED ORDER — MIDAZOLAM HCL 5 MG/5ML IJ SOLN
INTRAMUSCULAR | Status: DC | PRN
  Administered 2011-12-30: 2 mg via INTRAVENOUS

## 2011-12-30 MED ORDER — CIPROFLOXACIN HCL 250 MG PO TABS: 250.0000 mg | ORAL_TABLET | Freq: Two times a day (BID) | ORAL | Status: DC

## 2011-12-30 MED ORDER — METOCLOPRAMIDE HCL 5 MG/ML IJ SOLN
INTRAMUSCULAR | Status: DC | PRN
  Administered 2011-12-30: 10 mg via INTRAVENOUS

## 2011-12-30 MED ORDER — LIDOCAINE HCL 2 % EX GEL
CUTANEOUS | Status: AC
  Filled 2011-12-30: qty 10

## 2011-12-30 MED ORDER — SODIUM CHLORIDE 0.9 % IR SOLN
Status: DC | PRN
  Administered 2011-12-30: 3000 mL

## 2011-12-30 MED ORDER — IOHEXOL 300 MG/ML  SOLN
INTRAMUSCULAR | Status: DC | PRN
  Administered 2011-12-30: 12 mL

## 2011-12-30 MED ORDER — OXYBUTYNIN CHLORIDE 5 MG PO TABS: 5.0000 mg | ORAL_TABLET | Freq: Three times a day (TID) | ORAL | Status: DC

## 2011-12-30 MED ORDER — BELLADONNA ALKALOIDS-OPIUM 16.2-60 MG RE SUPP
RECTAL | Status: AC
  Filled 2011-12-30: qty 1

## 2011-12-30 MED ORDER — CIPROFLOXACIN IN D5W 400 MG/200ML IV SOLN
INTRAVENOUS | Status: DC | PRN
  Administered 2011-12-30: 400 mg via INTRAVENOUS

## 2011-12-30 MED ORDER — MUPIROCIN 2 % EX OINT
TOPICAL_OINTMENT | Freq: Two times a day (BID) | CUTANEOUS | Status: DC
  Administered 2011-12-30: 1 via NASAL
  Filled 2011-12-30 (×2): qty 22

## 2011-12-30 MED ORDER — MUPIROCIN 2 % EX OINT: TOPICAL_OINTMENT | Freq: Two times a day (BID) | CUTANEOUS | Status: DC

## 2011-12-30 MED ORDER — CIPROFLOXACIN IN D5W 400 MG/200ML IV SOLN
INTRAVENOUS | Status: AC
  Filled 2011-12-30: qty 200

## 2011-12-30 MED ORDER — IOHEXOL 300 MG/ML  SOLN
INTRAMUSCULAR | Status: AC
  Filled 2011-12-30: qty 1

## 2011-12-30 MED ORDER — LACTATED RINGERS IV SOLN
INTRAVENOUS | Status: DC | PRN
  Administered 2011-12-30 (×2): via INTRAVENOUS

## 2011-12-30 MED ORDER — FENTANYL CITRATE 0.05 MG/ML IJ SOLN
INTRAMUSCULAR | Status: DC | PRN
  Administered 2011-12-30 (×2): 25 ug via INTRAVENOUS
  Administered 2011-12-30: 50 ug via INTRAVENOUS

## 2011-12-30 MED ORDER — 0.9 % SODIUM CHLORIDE (POUR BTL) OPTIME
TOPICAL | Status: DC | PRN
  Administered 2011-12-30: 1000 mL

## 2011-12-30 MED ORDER — PROPOFOL 10 MG/ML IV BOLUS
INTRAVENOUS | Status: DC | PRN
  Administered 2011-12-30: 140 mg via INTRAVENOUS

## 2011-12-30 SURGICAL SUPPLY — 18 items
BAG URO CATCHER STRL LF (DRAPE) ×2 IMPLANT
BASKET ZERO TIP NITINOL 2.4FR (BASKET) ×1 IMPLANT
BSKT STON RTRVL ZERO TP 2.4FR (BASKET) ×1
CATH URET 5FR 28IN OPEN ENDED (CATHETERS) ×2 IMPLANT
CLOTH BEACON ORANGE TIMEOUT ST (SAFETY) ×2 IMPLANT
DRAPE CAMERA CLOSED 9X96 (DRAPES) ×2 IMPLANT
FIBER LASER TRAC TIP (UROLOGICAL SUPPLIES) ×1 IMPLANT
GLOVE SURG SS PI 8.0 STRL IVOR (GLOVE) ×2 IMPLANT
GOWN PREVENTION PLUS XLARGE (GOWN DISPOSABLE) ×2 IMPLANT
GOWN STRL REIN XL XLG (GOWN DISPOSABLE) ×2 IMPLANT
IV NS IRRIG 3000ML ARTHROMATIC (IV SOLUTION) ×2 IMPLANT
MANIFOLD NEPTUNE II (INSTRUMENTS) ×2 IMPLANT
MARKER SKIN DUAL TIP RULER LAB (MISCELLANEOUS) ×2 IMPLANT
NS IRRIG 1000ML POUR BTL (IV SOLUTION) ×1 IMPLANT
PACK CYSTO (CUSTOM PROCEDURE TRAY) ×2 IMPLANT
SHEATH URET ACCESS 12FR/35CM (UROLOGICAL SUPPLIES) ×1 IMPLANT
STENT CONTOUR 6FRX24X.038 (STENTS) ×1 IMPLANT
TUBING CONNECTING 10 (TUBING) ×2 IMPLANT

## 2011-12-30 NOTE — H&P (Signed)
Urology History and Physical Exam  CC: Bilateral kidney stones  HPI: 56 year old male presents at this time, having been seen in the office today with significant left flank pain, or bilateral ureteroscopy and holmium laser/extraction of stones. He was seen earlier in the month by Dr. Isabel Caprice, and was relatively asymptomatic despite having probable small stones in each ureter. He has had persistent, worsening pain especially on the left side. He has had no fever, chills, but has had significant nausea and vomiting and inability to keep food or fluids down. Because of the significant pain and bilateral ureteral stone burden, he presents at this time for attempted extraction of both ureteral stones.  PMH: Past Medical History  Diagnosis Date  . Unspecified sinusitis (chronic)   . Allergic rhinitis, cause unspecified   . Chronic airway obstruction, not elsewhere classified   . Unspecified asthma     start Xolair 03-2010  . Lung nodule     13x33mm posterior RLL CT 06-27-09  . GERD (gastroesophageal reflux disease)   . History of kidney stones     PSH: Past Surgical History  Procedure Date  . Lobectomy age 73    Right middle with tracheostomy  . Sympathectomy     Right carotid body removed for asthma  . Ventilator dependent respiratory failure 1989    asthma   . Many asthma hospitalizations   . Nasal sinus surgery 1990    x 2  . Submandibular salivary glands removed 2012    Allergies: Allergies  Allergen Reactions  . Erythromycin Ethylsuccinate     REACTION: unspecified  . Penicillins     Medications: No prescriptions prior to admission     Social History: History   Social History  . Marital Status: Married    Spouse Name: N/A    Number of Children: N/A  . Years of Education: 16   Occupational History  . medical lab tech   . MEDICAL [employer]    Social History Main Topics  . Smoking status: Never Smoker   . Smokeless tobacco: Not on file  . Alcohol Use: Yes   rarely  . Drug Use: No  . Sexually Active: Not on file   Other Topics Concern  . Not on file   Social History Narrative   Regular exercise-noCaffeine use-yes    Family History: Family History  Problem Relation Age of Onset  . Cancer Mother     lymphoepithelial  . Asthma Father     allergic  . Asthma Brother     allergic  . Testicular cancer Maternal Grandfather   . Cancer Other     Grandfather-Prostate Cancer    Review of Systems: Positive: Nausea, vomiting, left greater than right flank pain Negative: .  A further 10 point review of systems was negative except what is listed in the HPI.  Physical Exam: @VITALS2 @ General: He appeared in acute distress this morning.  Head:  Normocephalic.  Atraumatic. ENT:  EOMI.  Mucous membranes dry Neck:  Supple.  No lymphadenopathy. CV:  S1 present. S2 present. Regular rate. Pulmonary: Equal effort bilaterally.  Clear to auscultation bilaterally. Abdomen: Soft.  Left lower abdominal tenderness. No rebound or guarding Skin:  Normal turgor.  No visible rash. Extremity: No gross deformity of bilateral upper extremities.  No gross deformity of bilateral lower extremities. Neurologic: Alert. Appropriate mood.    Studies: Recent urinalysis was negative. CT urogram today revealed significant left hydronephrosis on the left, with bilateral ureteral calculi. No results found for  this basename: HGB:2,WBC:2,PLT:2 in the last 72 hours  No results found for this basename: NA:2,K:2,CL:2,CO2:2,BUN:2,CREATININE:2,CALCIUM:2,MAGNESIUM:2,GFRNONAA:2,GFRAA:2 in the last 72 hours   No results found for this basename: PT:2,INR:2,APTT:2 in the last 72 hours   No components found with this basename: ABG:2    Assessment:  Symptomatic bilateral ureteral calculi with significant left-sided obstruction. He is failed conservative therapy.  Plan: Cystoscopy, bilateral retrograde ureteropyelograms, bilateral ureteroscopy with holmium laser and  extraction of stones.

## 2011-12-30 NOTE — Transfer of Care (Signed)
Immediate Anesthesia Transfer of Care Note  Patient: Eric [name]  Procedure(s) Performed: Procedure(s) (LRB) with comments: CYSTOSCOPY WITH RETROGRADE PYELOGRAM, URETEROSCOPY AND STENT PLACEMENT (Bilateral) - DIGITAL  FLEXIBLE URETEROSCOPY HOLMIUM LASER APPLICATION (Bilateral)  Patient Location: PACU  Anesthesia Type: General  Level of Consciousness: awake, sedated and patient cooperative  Airway & Oxygen Therapy: Patient Spontanous Breathing and Patient connected to face mask oxygen  Post-op Assessment: Report given to PACU RN and Post -op Vital signs reviewed and stable  Post vital signs: Reviewed and stable  Complications: No apparent anesthesia complications

## 2011-12-30 NOTE — Anesthesia Preprocedure Evaluation (Addendum)
Anesthesia Evaluation  Patient identified by MRN, date of birth, ID band Patient awake    Reviewed: Allergy & Precautions, H&P , NPO status , Patient's Chart, lab work & pertinent test results  Airway Mallampati: II TM Distance: >3 FB Neck ROM: Full    Dental No notable dental hx.    Pulmonary asthma , COPD COPD inhaler,  breath sounds clear to auscultation  Pulmonary exam normal       Cardiovascular negative cardio ROS  Rhythm:Regular Rate:Normal     Neuro/Psych negative neurological ROS  negative psych ROS   GI/Hepatic Neg liver ROS, GERD-  Medicated,  Endo/Other  negative endocrine ROS  Renal/GU negative Renal ROS  negative genitourinary   Musculoskeletal negative musculoskeletal ROS (+)   Abdominal   Peds negative pediatric ROS (+)  Hematology negative hematology ROS (+)   Anesthesia Other Findings   Reproductive/Obstetrics negative OB ROS                           Anesthesia Physical Anesthesia Plan  ASA: III  Anesthesia Plan: General   Post-op Pain Management:    Induction: Intravenous  Airway Management Planned: LMA  Additional Equipment:   Intra-op Plan:   Post-operative Plan: Extubation in OR  Informed Consent: I have reviewed the patients History and Physical, chart, labs and discussed the procedure including the risks, benefits and alternatives for the proposed anesthesia with the patient or authorized representative who has indicated his/her understanding and acceptance.   Dental advisory given  Plan Discussed with: CRNA  Anesthesia Plan Comments: (Significant Asthma on steroids and inhalers/nebs. Breathing is good today. NPO after 1000 (cracker).)       Anesthesia Quick Evaluation

## 2011-12-30 NOTE — Op Note (Signed)
Preoperative diagnosis: Bilateral upper ureteral calculi  Postoperative diagnosis: Same   Procedure: Cystoscopy, bilateral retrograde ureteropyelograms with interpretation of fluoroscopy, bilateral ureteroscopy with holmium laser and extraction of bilateral upper ureteral calculi, placement of bilateral double-J stent (Eric [place] by 24 cm contour with string)    Surgeon: Bertram Millard. Aeneas Longsworth, M.D.   Anesthesia: Gen.   Complications: Fluoroscopic malfunction, no consequences with patient outcome  Specimen(s): Stones, to family  Drain(s): Bilateral double-J stent, see above  Indications: 56 year old [name] with significantly symptomatic (recently) bilateral ureteral stones. He presented to our office earlier today with significant left flank pain. He has been followed for the stones, as they are small to moderate in size. It was hoped that he would pass the stones with  MET. Due to his significant pain and inability to tolerate this as an outpatient, he comes to the operating room now for bilateral ureteroscopy and holmium laser/extraction of the stones. Risks and complications were discussed with the patient in the holding area prior to the procedure. He understands these and desires to proceed.   Technique and findings: The patient was identified in the holding area and received preoperative IV antibiotics. He was taken to the operating room where general anesthesia was administered. He was placed in the dorsolithotomy position. Genitalia and perineum were prepped and draped. Timeout was then performed.  A 22 French panendoscope was then advanced under direct vision through his urethra. His urethra was normal, prostate was nonobstructive. His bladder was entered and inspected circumferentially. There were no tumors, trabeculations or foreign bodies. Ureteral orifices were normal in configuration and location.  Bilateral retrograde ureteropyelograms were performed. On the right, there was a normal  ureter up to the proximal ureter, where there was an obvious filling defect approximately 4 mm in size. There was proximal pyelocaliectasis. On the left, there was a similar appearance with a normal distal ureter, mid ureter, but a filling defect approximately 3-4 mm in size proximally just distal to the UPJ. I then placed a guidewire up into the left ureter using fluoroscopic guidance. I dilated the distal ureter with the inner core of a ureteral access catheter. I was unable to dilate enough to get the outer core more proximally. I then advanced the rigid ureteroscope up through the ureter with some difficulty, due to the tortuosity. I encountered a clot, with an obvious stone within it. I grasped the stone, moved more distally down the ureter, and released it. I then used the 200  fiber to apply it laser energy to the stone, fragmenting it up into small sand-like fragments. I did not think any of these fragments needed to be extracted, as they were quite small. The scope was advanced more proximally, no further stones were seen. I grasped the blood clot him a but there was no more stone in that.  With a guidewire in place, I negotiated the rigid ureteroscope up to the right proximal ureteral stones. There was an of the is ureteral stricture just distal to the stones which was easily passed with the scope. There were a couple of his, they were fragmented into smaller fragments, approximately Eric-7 which were mostly extracted with the Nitinol basket down into the operative field. I replaced the scope, and there were just a small sand-like fragments that were left indwelling. No further stones were seen in the ureter.  At this point, it was obvious that the fluoroscope system had malfunctioned. I was fairly certain that the guidewires were normally placed in the renal  pelves bilaterally. I then placed, separately, a 24 cm x Eric French contour double-J stent with the string over top of the guidewire bilaterally.  After I felt these were in adequate position, I removed the guidewire. Good curls were seen cystoscopically. However, I was unable, without the use of the fluoroscope, to verify proximal position of the stents. The bladder was drained and the scope removed. The strings were taped to the patient's penis. Prior to reversal of the anesthetic, radiology brought in a portable x-ray machine and a KUB was performed, verifying adequate position of both stents.  At this point, the patient was awakened, taken to the PACU in stable condition. He tolerated the procedure well.

## 2011-12-30 NOTE — Telephone Encounter (Signed)
Will forward to [name] as FYI  

## 2011-12-30 NOTE — Anesthesia Postprocedure Evaluation (Signed)
  Anesthesia Post-op Note  Patient: Eric [name]  Procedure(s) Performed: Procedure(s) (LRB): CYSTOSCOPY WITH RETROGRADE PYELOGRAM, URETEROSCOPY AND STENT PLACEMENT (Bilateral) HOLMIUM LASER APPLICATION (Bilateral)  Patient Location: PACU  Anesthesia Type: General  Level of Consciousness: awake and alert   Airway and Oxygen Therapy: Patient Spontanous Breathing  Post-op Pain: mild  Post-op Assessment: Post-op Vital signs reviewed, Patient's Cardiovascular Status Stable, Respiratory Function Stable, Patent Airway and No signs of Nausea or vomiting  Post-op Vital Signs: stable  Complications: No apparent anesthesia complications. Denies dyspnea.

## 2012-01-07 ENCOUNTER — Ambulatory Visit (INDEPENDENT_AMBULATORY_CARE_PROVIDER_SITE_OTHER): Payer: 59 | Admitting: Internal Medicine

## 2012-01-07 ENCOUNTER — Encounter: Payer: Self-pay | Admitting: Internal Medicine

## 2012-01-07 VITALS — BP 110/78 | HR 78 | Ht 68.0 in | Wt 175.4 lb

## 2012-01-07 DIAGNOSIS — I509 Heart failure, unspecified: Secondary | ICD-10-CM

## 2012-01-07 DIAGNOSIS — J449 Chronic obstructive pulmonary disease, unspecified: Secondary | ICD-10-CM

## 2012-01-07 DIAGNOSIS — I2789 Other specified pulmonary heart diseases: Secondary | ICD-10-CM

## 2012-01-07 DIAGNOSIS — I272 Pulmonary hypertension, unspecified: Secondary | ICD-10-CM

## 2012-01-07 NOTE — Patient Instructions (Addendum)
I recommend extension of total temporary disability for 3  more months, until March 15, 2012 We will contact your disability insurance carrier as requested  Order- schedule echocardiogram  Dx CHF, pulmonary hypertension  Continue medrol, adjusting as discussed when able

## 2012-01-07 NOTE — Progress Notes (Signed)
Patient ID: Eric [name], male    DOB: [birth year], 56 y.o.   MRN: [id number]  HPI 4/ 3/12- 44 yo never smoker with hx chronic obstructive asthma, now on Xolair. Xolair is being dosed 150 mg per month, but he feels it wearing off by 3 weeks and asks about a dosing change. He remains on prednisone 5 mg daily. At 3 weeks after a xolair dose he begins to feel tighter, more wheeze. He had to miss work today because of asthma and he increased prednisone today to 20 mg. . He denies obvious change with seasonal pollens- no itch, sneeze,watery eyes.   10/24/10- 75 yo never smoker with hx chronic obstructive asthma, now on Xolair. Has continued Xoalir monthly. One injection/ month lasted few weeks-not a month. Increased to 2 injections/ month, same total dose,  and not as clearly helping. Has continued chronic maintenance prednisone. We discussed allergic vs nonallergic triggers. He is watching what happens as we enter Fall season. He had to miss work due to asthma flare 2 days ago and was aggresively using neb. Trigger was lawnmower smoke in garage.  Today is great. Currently on prednisone 20 mg daily. He remembers bone density check by Dr Lavone Orn within last few years.  We discussed Daliresp and he is interested in giving it another try.   04/18/11- 27 yo never smoker with hx chronic obstructive asthma, now on Xolair. He has been trying to get to lower maintenance prednisone dose and asked for higher Xolair dose. We increased him 300-> Xolair 375 mg  monthly.    Wife here  FOLLOWS FOR: been out of work for 3 days as of today-doesnt want to go up past prednisone 15mg  (was on 30mg ),using Zyflo. Wheezing increased-getting up in middle of night to do breathing tx's.   He was on prednisone 40 mg daily for several weeks before trying to drop down. Using nebulizer at night. Concerned about increased body fat from prednisone. We have had many long conversations about steroid side effects and alternatives over  the years. Theophylline did work when he was in college. Office spirometry: Severe obstructive airways disease. FEV1 1.29/35%, FEV1/FEC 0.46.  06/23/11- 56 yo never smoker with hx chronic obstructive asthma, now on Xolair. We had referred him to do, with the expectation he might be considered for thermal bronchoscopy to treat his asthma. They're treating him for reflux with Prilosec twice daily. He feels a Xolair 375 mg is helping after 2 doses, the way the first few doses did at 275 mg. He was just able to reduce his prednisone to 5 mg daily with mild residual wheeze. We reviewed findings associated with steroid withdrawal and adrenal insufficiency and discussed endocrinology referral to help with this issue.  08/20/11- 53 yo never smoker with hx chronic obstructive asthma, now on Xolair. Pt c/o increased SOB, chest tightness and prod cough x 1 week  Pt tx with Pred 30 mg x1 week then increased to 50mg  qd x 2days. Pt denies f/c/s.  Pt states even with the prednisone he cannot get relief. He has been on 30 mg of prednisone for most of this spring on a daily basis, usually with some wheeze but able to work. In the last day or 2 chest tightness and wheeze have been worse without specific reason. He had to increase prednisone to 50 mg daily. He continues Xolair injections. No recent colds or obvious exposures. Scant phlegm with trace yellow. He denies sinus problems. Taking Prilosec twice daily as  directed by the pulmonologist we sent him to at [facility]. We sent him specifically for evaluation of thermoplasty. He apparently is a little outside of their selection criteria but they're considering him. He has missed work intermittently. There is no part-time work available but he feels unable to do a 40 hour work week anymore  09/17/11- 29 yo never smoker with hx chronic obstructive asthma, now on Xolair.  Pt states he has notices a lot of improvement while on the Pred taper (now on 10mg ). Pt c/o slight wheezing and  sob, but not too concerned. Pt denies chest tightness, prod cough.  Discussed his work with the pulmonologist at [facility]. They emphasize management of his GERD, demonstrated with barium swallow. They plan PFTs at his next visit so we won't do them here. He has been out of work now for 3 months. 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 He continues Brovana and Atrovent nebulizers twice daily with rare need now for rescue inhaler. He did like Arcapta and we compared that to his nebulizer. He failed Singulair and Zyflo.  11/07/11- 44 yo never smoker with hx chronic obstructive asthma, ended Xolair June, 2013.    Wife here. Still having increased SOB-currently on 8mg  Prednisone-stilll out of work. Wheezing as well and cough. Recent exacer- improved after doxy. Has been able to maintain on prednisone 8 mg daily but no lower. [facility] said not good candidate for thermoplasty. Short term disability through Sept 11.  Long discussion of options, meds. Will try Brovana neb in AM/ Arcapta in PM.   12/10/11- 62 yo never smoker with hx chronic obstructive asthma, ended Xolair June, 2013.    Wife here. Still having SOB and wheezing at times; still out of work. Can tolerate light to moderate exertion on some days, but on other days wheezing dyspnea is too limiting. He is pleased to be would remain on only 8 mg daily prednisone now for 2 months at that dose. We have carefully discussed long-term steroid side effects. He asks extension of leave of absence one more month and has applied for total and permanent disability. Now out of work x3 months. No longer being followed at [facility] pulmonary. COPD assessment test (CAT) score 22/40.  01/07/12- 30 yo never smoker with hx chronic obstructive asthma, ended Xolair June, 2013.  Breathing no better but no worse than last time He considers that a good thing that he has been able to hold prednisone maintenance at 8 milligrams  daily. Exertional dyspnea easy fatigue little cough no chest pain or fever. He talks about the possibility of going back to work but it doesn't seem realistic. We agreed to extend his short term disability. CXR 12/30/11-    IMPRESSION:  1. Stable cardiomegaly with now mild pulmonary vascular  congestion.  2. Stable scarring at the lung apices bilaterally.  Original Report Authenticated By: Jamesetta Orleans. MATTERN, M.D.    Review of Systems-See HPI Constitutional:   No weight loss, night sweats,  Fevers, chills, fatigue, lassitude. HEENT:   No headaches,  Difficulty swallowing,  Tooth/dental problems,  Sore throat,                No sneezing, itching, ear ache, No-nasal congestion, post nasal drip,  CV:  No chest pain, orthopnea, PND, swelling in lower extremities, anasarca, dizziness, palpitations GI  No heartburn, indigestion, abdominal pain, nausea, vomiting, Resp:   No excess mucus, no productive cough,  + non-productive cough,  No coughing  up of blood.  No change in color of mucus.  + wheezing. Skin: no rash or lesions. GU:  MS:  No joint pain or swelling. Marland Kitchen Psych:  No change in mood or affect. No depression or anxiety.  No memory loss.     Objective:   Physical Exam BP 110/78  Pulse 78  Ht 5\' 8"  (1.727 m)  Wt 175 lb 6.4 oz (79.561 kg)  BMI 26.67 kg/m2  SpO2 92% General- Alert, Oriented, Affect-appropriate, Distress- none acute, mildly cushingoid??- full cheeks, Skin- rash-none, lesions- none, excoriation- none Lymphadenopathy- none Head- atraumatic            Eyes- Gross vision intact, PERRLA, conjunctivae- clear secretions            Ears- Hearing, canals normal            Nose- Clear, no-Septal dev, mucus, polyps, erosion, perforation             Throat- Mallampati II , mucosa clear , drainage- none, tonsils- atrophic.  Neck- flexible , trachea midline, no stridor , thyroid nl, carotid no bruit Chest - symmetrical excursion , unlabored           Heart/CV- RRR , no murmur  , no gallop  , no rub, nl s1 s2                           - JVD- none , edema- none, stasis changes- none, varices- none           Lung-  + unlabored I&E wheeze all fields- quieter this visit.  cough- none , dullness-none, rub- none           Chest wall-  Abd- Br/ Gen/ Rectal- Not done, not indicated Extrem- cyanosis- none, clubbing, none, atrophy- none, strength- nl Neuro- grossly intact to observation

## 2012-01-12 ENCOUNTER — Ambulatory Visit: Payer: 59 | Admitting: Internal Medicine

## 2012-01-14 ENCOUNTER — Other Ambulatory Visit: Payer: Self-pay

## 2012-01-14 ENCOUNTER — Ambulatory Visit (HOSPITAL_COMMUNITY): Payer: 59 | Attending: Internal Medicine | Admitting: Radiology

## 2012-01-14 DIAGNOSIS — I369 Nonrheumatic tricuspid valve disorder, unspecified: Secondary | ICD-10-CM | POA: Insufficient documentation

## 2012-01-14 DIAGNOSIS — I272 Pulmonary hypertension, unspecified: Secondary | ICD-10-CM

## 2012-01-14 DIAGNOSIS — I059 Rheumatic mitral valve disease, unspecified: Secondary | ICD-10-CM | POA: Insufficient documentation

## 2012-01-14 DIAGNOSIS — R0989 Other specified symptoms and signs involving the circulatory and respiratory systems: Secondary | ICD-10-CM | POA: Insufficient documentation

## 2012-01-14 DIAGNOSIS — I517 Cardiomegaly: Secondary | ICD-10-CM | POA: Insufficient documentation

## 2012-01-14 DIAGNOSIS — I379 Nonrheumatic pulmonary valve disorder, unspecified: Secondary | ICD-10-CM | POA: Insufficient documentation

## 2012-01-14 DIAGNOSIS — R0609 Other forms of dyspnea: Secondary | ICD-10-CM | POA: Insufficient documentation

## 2012-01-14 DIAGNOSIS — J45909 Unspecified asthma, uncomplicated: Secondary | ICD-10-CM | POA: Insufficient documentation

## 2012-01-14 DIAGNOSIS — R0602 Shortness of breath: Secondary | ICD-10-CM

## 2012-01-14 DIAGNOSIS — I509 Heart failure, unspecified: Secondary | ICD-10-CM

## 2012-01-14 NOTE — Progress Notes (Signed)
Echocardiogram performed.  

## 2012-01-15 ENCOUNTER — Telehealth: Payer: Self-pay | Admitting: Internal Medicine

## 2012-01-15 NOTE — Progress Notes (Signed)
Quick Note:  Pt aware of results. ______ 

## 2012-01-15 NOTE — Telephone Encounter (Signed)
Result Note     Echo- pumping power of the heart is normal, and there was no pressure overload from the lungs, which is what i wanted to know. Good report.   Spoke with patient-aware of results.

## 2012-01-18 NOTE — Assessment & Plan Note (Signed)
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 We have again discussed long-term steroid side effects. Will extend disability, but I think total and permanent disability is going to be the probable outcome.

## 2012-01-23 ENCOUNTER — Other Ambulatory Visit: Payer: Self-pay | Admitting: *Deleted

## 2012-01-23 MED ORDER — ALBUTEROL SULFATE HFA 108 (90 BASE) MCG/ACT IN AERS: INHALATION_SPRAY | RESPIRATORY_TRACT | Status: DC

## 2012-01-26 ENCOUNTER — Telehealth: Payer: Self-pay | Admitting: Internal Medicine

## 2012-01-26 NOTE — Telephone Encounter (Signed)
Spoke with patient-aware that we have paperwork and had to update information; Eric [name] in [employer] will have papers to send back tomorrow as she is gone for the day.

## 2012-01-26 NOTE — Telephone Encounter (Signed)
[name] have you seen this paperwork. Please advise thanks

## 2012-01-29 ENCOUNTER — Telehealth: Payer: Self-pay | Admitting: Internal Medicine

## 2012-01-29 NOTE — Telephone Encounter (Signed)
Papers were signed and given back to Eric [name] in [facility] who stated she faxed all papers back yesterday. I spoke with [name] and he was aware that all was done. See previous phone note. Thanks.

## 2012-01-29 NOTE — Telephone Encounter (Signed)
[name]---have you seen these disability papers on this pt?  thanks

## 2012-01-29 NOTE — Telephone Encounter (Signed)
Pt spouse is aware. Eann Cleland, CMA  

## 2012-04-08 ENCOUNTER — Encounter: Payer: Self-pay | Admitting: Internal Medicine

## 2012-04-08 ENCOUNTER — Ambulatory Visit (INDEPENDENT_AMBULATORY_CARE_PROVIDER_SITE_OTHER): Payer: BC Managed Care – PPO | Admitting: Internal Medicine

## 2012-04-08 VITALS — BP 118/80 | HR 87 | Ht 68.0 in | Wt 169.4 lb

## 2012-04-08 DIAGNOSIS — J449 Chronic obstructive pulmonary disease, unspecified: Secondary | ICD-10-CM

## 2012-04-08 MED ORDER — FLUTICASONE FUROATE-VILANTEROL 100-25 MCG/INH IN AEPB: 1.0000 | INHALATION_SPRAY | Freq: Every day | RESPIRATORY_TRACT | Status: DC

## 2012-04-08 MED ORDER — METHYLPREDNISOLONE ACETATE 80 MG/ML IJ SUSP
80.0000 mg | Freq: Once | INTRAMUSCULAR | Status: AC
  Administered 2012-04-08: 80 mg via INTRAMUSCULAR

## 2012-04-08 MED ORDER — LEVALBUTEROL HCL 0.63 MG/3ML IN NEBU
0.6300 mg | INHALATION_SOLUTION | Freq: Once | RESPIRATORY_TRACT | Status: AC
  Administered 2012-04-08: 0.63 mg via RESPIRATORY_TRACT

## 2012-04-08 NOTE — Progress Notes (Signed)
Patient ID: Eric [name], male    DOB: [birth year], 57 y.o.   MRN: [id number]  HPI 4/ 3/12- 57 yo never smoker with hx chronic obstructive asthma, now on Xolair. Xolair is being dosed 150 mg per month, but he feels it wearing off by 3 weeks and asks about a dosing change. He remains on prednisone 5 mg daily. At 3 weeks after a xolair dose he begins to feel tighter, more wheeze. He had to miss work today because of asthma and he increased prednisone today to 20 mg. . He denies obvious change with seasonal pollens- no itch, sneeze,watery eyes.   10/24/10- 57 yo never smoker with hx chronic obstructive asthma, now on Xolair. Has continued Xoalir monthly. One injection/ month lasted few weeks-not a month. Increased to 2 injections/ month, same total dose,  and not as clearly helping. Has continued chronic maintenance prednisone. We discussed allergic vs nonallergic triggers. He is watching what happens as we enter Fall season. He had to miss work due to asthma flare 2 days ago and was aggresively using neb. Trigger was lawnmower smoke in garage.  Today is great. Currently on prednisone 20 mg daily. He remembers bone density check by Dr Lavone Orn within last few years.  We discussed Daliresp and he is interested in giving it another try.   04/18/11- 57 yo never smoker with hx chronic obstructive asthma, now on Xolair. He has been trying to get to lower maintenance prednisone dose and asked for higher Xolair dose. We increased him 300-> Xolair 375 mg  monthly.    Wife here  FOLLOWS FOR: been out of work for 3 days as of today-doesnt want to go up past prednisone 15mg  (was on 30mg ),using Zyflo. Wheezing increased-getting up in middle of night to do breathing tx's.   He was on prednisone 40 mg daily for several weeks before trying to drop down. Using nebulizer at night. Concerned about increased body fat from prednisone. We have had many long conversations about steroid side effects and alternatives over  the years. Theophylline did work when he was in college. Office spirometry: Severe obstructive airways disease. FEV1 1.29/35%, FEV1/FEC 0.46.  06/23/11- 56 yo never smoker with hx chronic obstructive asthma, now on Xolair. We had referred him to do, with the expectation he might be considered for thermal bronchoscopy to treat his asthma. They're treating him for reflux with Prilosec twice daily. He feels a Xolair 375 mg is helping after 2 doses, the way the first few doses did at 275 mg. He was just able to reduce his prednisone to 5 mg daily with mild residual wheeze. We reviewed findings associated with steroid withdrawal and adrenal insufficiency and discussed endocrinology referral to help with this issue.  08/20/11- 57 yo never smoker with hx chronic obstructive asthma, now on Xolair. Pt c/o increased SOB, chest tightness and prod cough x 1 week  Pt tx with Pred 30 mg x1 week then increased to 50mg  qd x 2days. Pt denies f/c/s.  Pt states even with the prednisone he cannot get relief. He has been on 30 mg of prednisone for most of this spring on a daily basis, usually with some wheeze but able to work. In the last day or 2 chest tightness and wheeze have been worse without specific reason. He had to increase prednisone to 50 mg daily. He continues Xolair injections. No recent colds or obvious exposures. Scant phlegm with trace yellow. He denies sinus problems. Taking Prilosec twice daily as  directed by the pulmonologist we sent him to at [facility]. We sent him specifically for evaluation of thermoplasty. He apparently is a little outside of their selection criteria but they're considering him. He has missed work intermittently. There is no part-time work available but he feels unable to do a 40 hour work week anymore  09/17/11- 57 yo never smoker with hx chronic obstructive asthma, now on Xolair.  Pt states he has notices a lot of improvement while on the Pred taper (now on 10mg ). Pt c/o slight wheezing and  sob, but not too concerned. Pt denies chest tightness, prod cough.  Discussed his work with the pulmonologist at [facility]. They emphasize management of his GERD, demonstrated with barium swallow. They plan PFTs at his next visit so we won't do them here. He has been out of work now for 3 months. 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 He continues Brovana and Atrovent nebulizers twice daily with rare need now for rescue inhaler. He did like Arcapta and we compared that to his nebulizer. He failed Singulair and Zyflo.  11/07/11- 61 yo never smoker with hx chronic obstructive asthma, ended Xolair June, 2013.    Wife here. Still having increased SOB-currently on 8mg  Prednisone-stilll out of work. Wheezing as well and cough. Recent exacer- improved after doxy. Has been able to maintain on prednisone 8 mg daily but no lower. [facility] said not good candidate for thermoplasty. Short term disability through Sept 11.  Long discussion of options, meds. Will try Brovana neb in AM/ Arcapta in PM.   12/10/11- 61 yo never smoker with hx chronic obstructive asthma, ended Xolair June, 2013.    Wife here. Still having SOB and wheezing at times; still out of work. Can tolerate light to moderate exertion on some days, but on other days wheezing dyspnea is too limiting. He is pleased to be would remain on only 8 mg daily prednisone now for 2 months at that dose. We have carefully discussed long-term steroid side effects. He asks extension of leave of absence one more month and has applied for total and permanent disability. Now out of work x3 months. No longer being followed at [facility] pulmonary. COPD assessment test (CAT) score 22/40.  01/07/12- 47 yo never smoker with hx chronic obstructive asthma, ended Xolair June, 2013.  Breathing no better but no worse than last time He considers that a good thing that he has been able to hold prednisone maintenance at 8 milligrams  daily. Exertional dyspnea easy fatigue little cough no chest pain or fever. He talks about the possibility of going back to work but it doesn't seem realistic. We agreed to extend his short term disability. CXR 12/30/11-    IMPRESSION:  1. Stable cardiomegaly with now mild pulmonary vascular  congestion.  2. Stable scarring at the lung apices bilaterally.  Original Report Authenticated By: Resa Miner. MATTERN, M.D.   04/08/12- 52 yo never smoker with hx chronic obstructive asthma, ended Xolair June, 2013.  FOLLOWS FOR: wheezing; has gotten worse in past 2-3 weeks even after albuterol and atrovent this morning. Acute exacerbation x 2-3 weeks-? Weather, but has not had a cold. Using all meds, incl used neb/ Duoneb 2 hours before arival this AM.  Remains out of work appropriately since June, 2013. Chronic asthma with COPD, steroid dependent trying not to go over 8 mg prednisone daily. Baseline daily wheeze and dyspnea, with occasional exacerbation. We have been able to keep him out  of hospital because he is good at self-management.  Failed Daliresp and Xolair. Several second-opinion visits to [facility] in 2013. They did not feel he was thermoplasty candidate.  Office spirometry 04/08/12- severe obstructive airways disease- FVC 2.64/58%, FEV1 1.30/36%, FEV1/FVC 0.49, FEF 25-75% 0.50/14%.  Review of Systems-See HPI Constitutional:   No weight loss, night sweats,  Fevers, chills, fatigue, lassitude. HEENT:   No headaches,  Difficulty swallowing,  Tooth/dental problems,  Sore throat,                No sneezing, itching, ear ache, No-nasal congestion, post nasal drip,  CV:  No chest pain, orthopnea, PND, swelling in lower extremities, anasarca, dizziness, palpitations GI  No heartburn, indigestion, abdominal pain, nausea, vomiting, Resp:   No excess mucus, no productive cough,  + non-productive cough,  No coughing up of blood.  No change in color of mucus.  + wheezing. Short of breath mainly w/  exertion.  Skin: no rash or lesions. GU:  MS:  No joint pain or swelling. Marland Kitchen Psych:  No change in mood or affect. No depression or anxiety.  No memory loss.  Objective:   Physical Exam BP 118/80  Pulse 87  Ht 5\' 8"  (1.727 m)  Wt 169 lb 6.4 oz (76.839 kg)  BMI 25.76 kg/m2  SpO2 91% General- Alert, Oriented, Affect-appropriate, Distress- none acute, but tripod and wheeze.  Skin- rash-none, lesions- none, excoriation- none Lymphadenopathy- none Head- atraumatic            Eyes- Gross vision intact, PERRLA, conjunctivae- clear secretions            Ears- Hearing, canals normal            Nose- Clear, no-Septal dev, mucus, polyps, erosion, perforation             Throat- Mallampati II , mucosa clear , drainage- none, tonsils- atrophic.  Neck- flexible , trachea midline, no stridor , thyroid nl, carotid no bruit Chest - symmetrical excursion , unlabored           Heart/CV- RRR , no murmur , no gallop  , no rub, nl s1 s2                           - JVD- none , edema- none, stasis changes- none, varices- none           Lung-  +  I<E wheeze all fields, able to speak full sentences.  cough- none , dullness-none, rub- none. Active wheeze 2 hours after nebulizer treatment with DuoNeb at home.           Chest wall-  Abd- Br/ Gen/ Rectal- Not done, not indicated Extrem- cyanosis- none, clubbing, none, atrophy- none, strength- nl Neuro- grossly intact to observation

## 2012-04-08 NOTE — Patient Instructions (Addendum)
Office spirometry  Neb xop 0.63  Depo 80  Sample Breo ellipta   Add to what you are taking now      1 puff then rinse mouth well  I will complete and return the insurance papers.

## 2012-04-08 NOTE — Assessment & Plan Note (Addendum)
Acute nonspecific exacerbation coincident with recent very cold weather. No obvious infection. Not fluid overloaded. He cannot work like this. Plan-try sample [employer] with discussion, choosing to add it to his current regimen for the short duration of the sample. Discussed cumulative stimulant effects.

## 2012-04-13 ENCOUNTER — Telehealth: Payer: Self-pay | Admitting: Internal Medicine

## 2012-04-13 NOTE — Telephone Encounter (Signed)
No need for message. °

## 2012-04-13 NOTE — Telephone Encounter (Signed)
Spoke with Eric [name] requesting Spirometry results mailed to verified address on file.  Results placed in envelope and placed in out going mail.  ----  Also, patients wife wanted to know if letters [name] were to complete have been faxed.  [name], please advise, thank you

## 2012-04-16 IMAGING — CT CT NECK W/ CM
4 of 6 series · 15 of 33 positions shown, 18 images · IV contrast (APPLIED)
Comparison: CT neck with contrast 11/27/2007.

CLINICAL DATA: Infected right submandibular gland.  Right-sided
pain and swelling.  History of salivary gland stone removal.

CT NECK WITH CONTRAST
TECHNIQUE: Multidetector CT imaging of the neck was performed with
intravenous contrast.
Contrast: 100 ml Omnipaque 300

[Series 3: st neck 2.0 b31s · axial · 0.38mm/px · z∈[+911,+1071]mm · 5 of 121 slices shown, 7 images]
[im 21/121  soft-tissue]
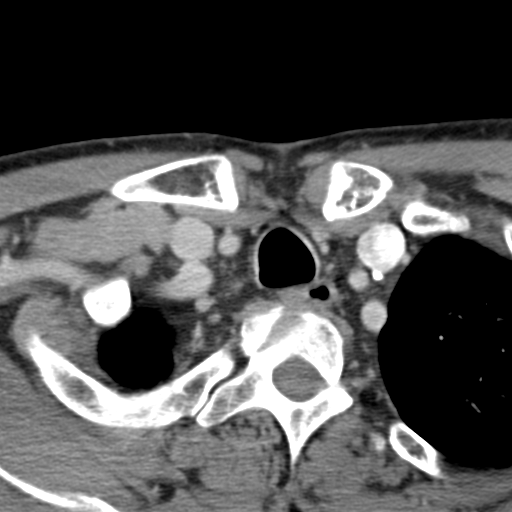
[im 21/121  bone]
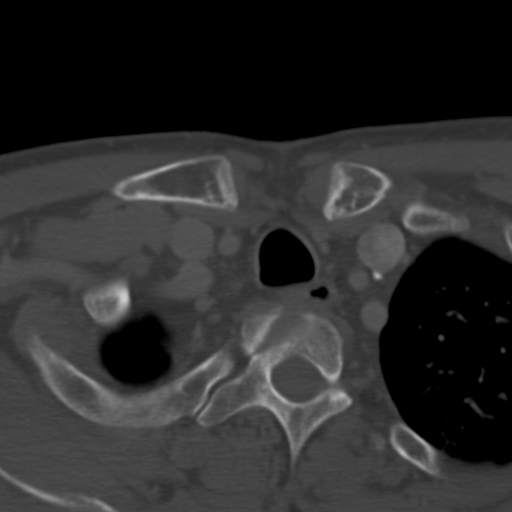
[im 41/121  bone]
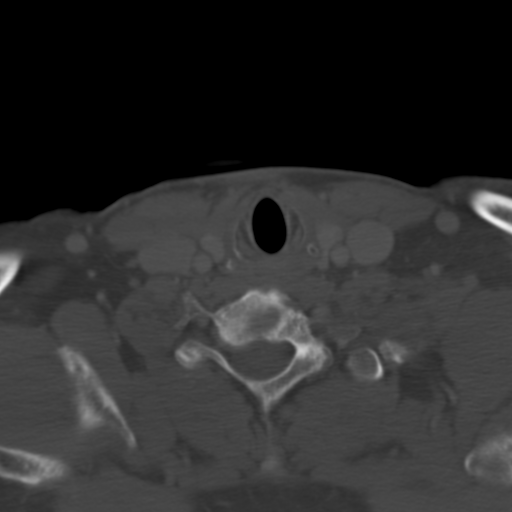
[im 61/121  bone]
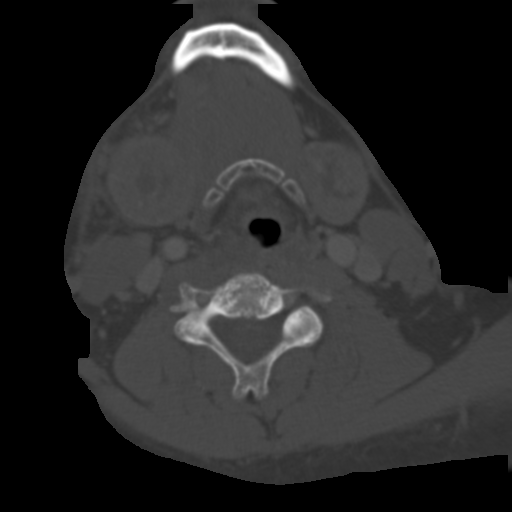
[im 81/121  bone]
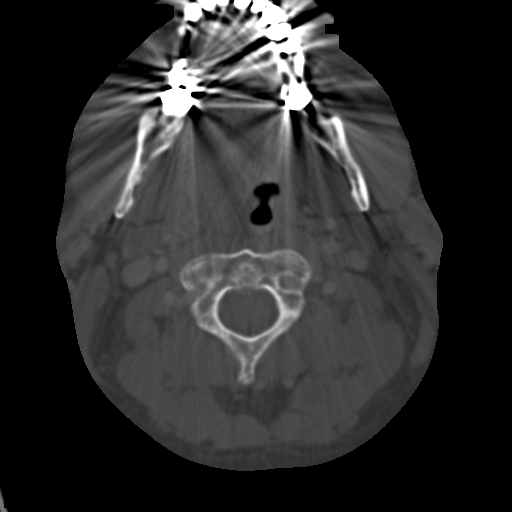
[im 101/121  soft-tissue]
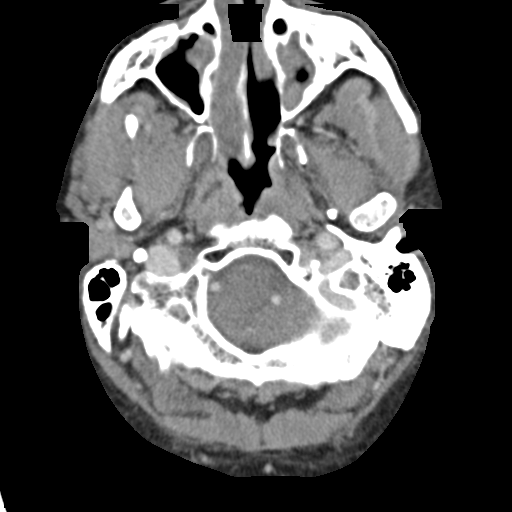
[im 101/121  bone]
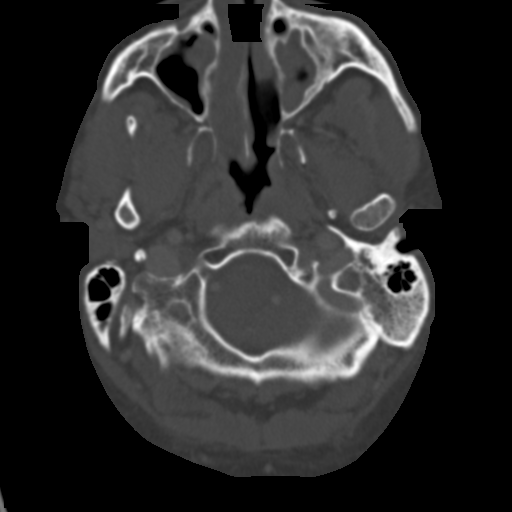

[Series 602: coronal · coronal · 0.48mm/px · 3 of 75 slices shown]
[im 15/75  bone]
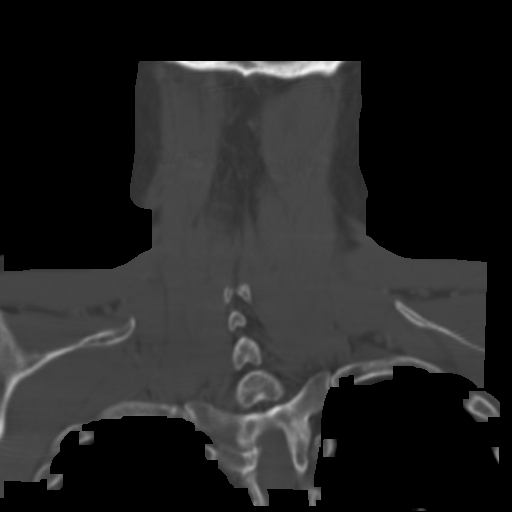
[im 30/75  bone]
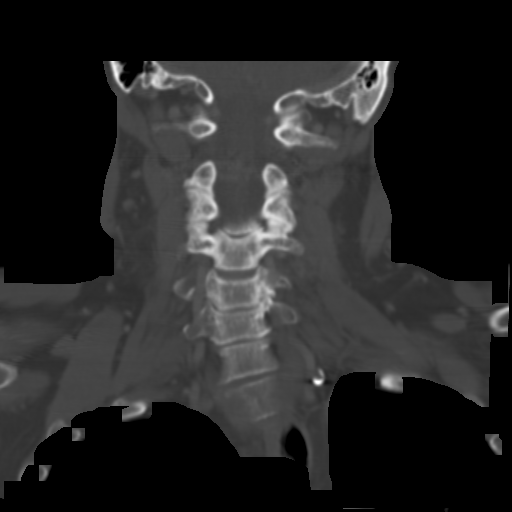
[im 45/75  bone]
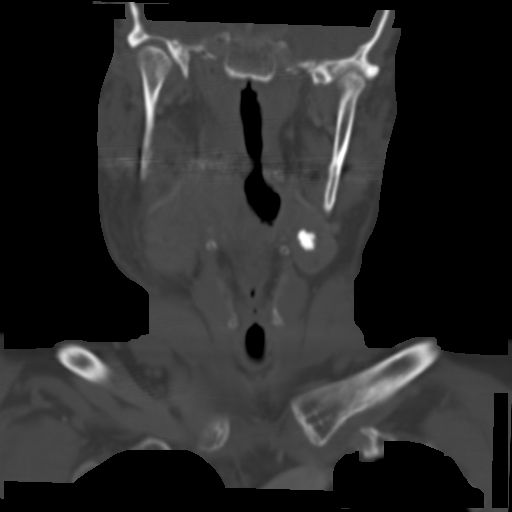

[Series 603: sagittal · sagittal · 0.48mm/px · 5 of 71 slices shown, 6 images]
[im 24/71  bone]
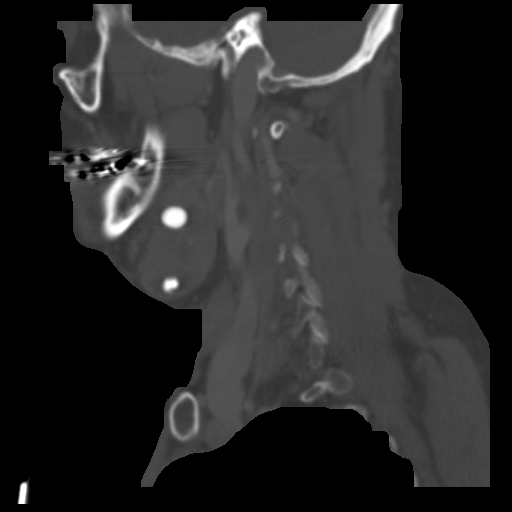
[im 30/71  bone]
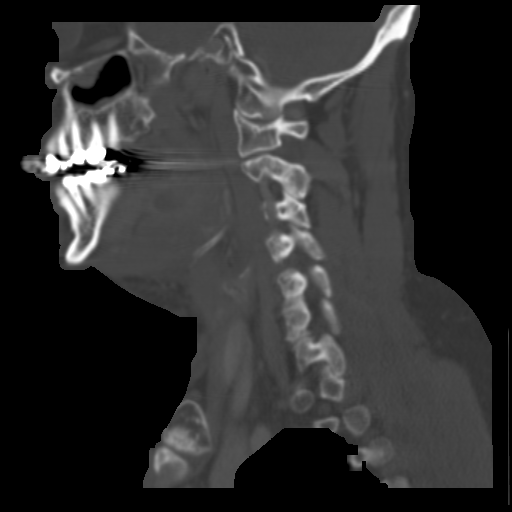
[im 36/71  soft-tissue]
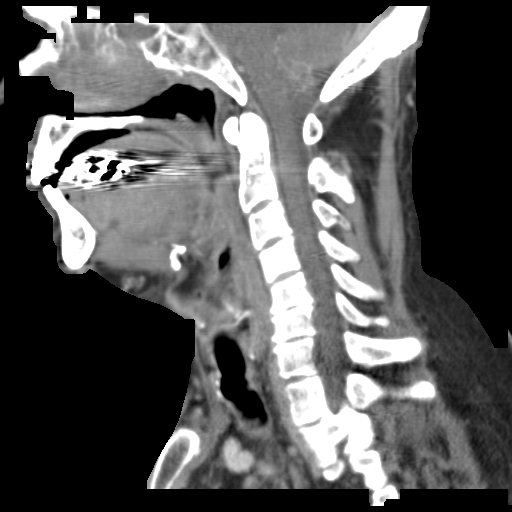
[im 36/71  bone]
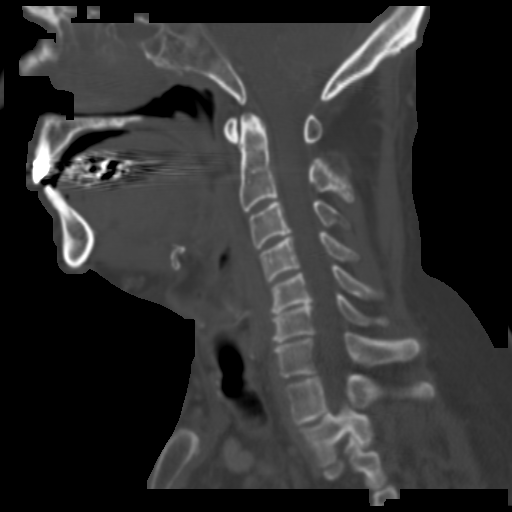
[im 41/71  bone]
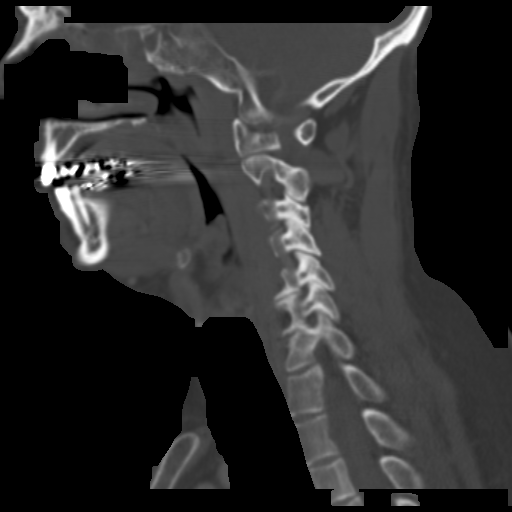
[im 47/71  bone]
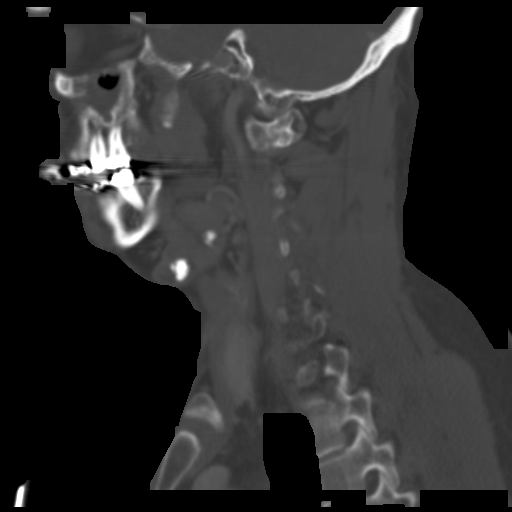

[Series 604: orthog · axial · 0.48mm/px · z∈[+924,+992]mm · 2 of 81 slices shown]
[im 27/81  bone]
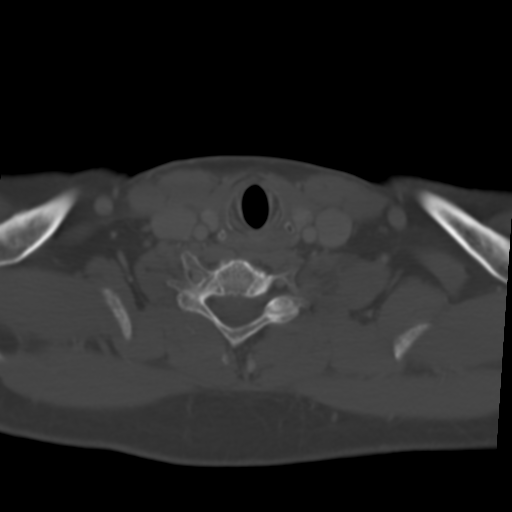
[im 54/81  bone]
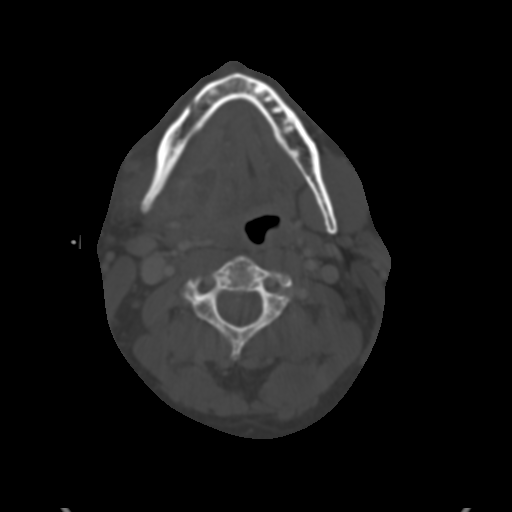

[15 of 33 positions shown; findings below may reference images not displayed]

FINDINGS: Three large stones are again seen within the right
submandibular gland.  The most superior stone has increased in
size, now measuring 16.0 x 10.5 mm, compared with 14.3 x 10.1 mm.
The most inferior stone has also increased in size, now measuring
17.3 x 9.6 mm.  Diffuse inflammatory changes are present to within
and about the right submandibular gland.  A focal area of
hypoattenuation and surrounding enhancement is present either
within or immediately medial to the gland.  This could potentially
be within the posterior aspect of the submandibular duct.  The
collection measures 11 x 19 x 9 mm.  Two left-sided submandibular
stones are similar to the prior exam.  No focal inflammatory
changes are apparent about the left gland.

Right-sided inflammatory changes extend within the submandibular
space is well as the neck.  There is some thickening of the
platysma and subcutaneous edematous changes extending to the level
of C6-7.

Enlarged right level II lymph nodes are likely reactive.

Mild pleural thickening is noted posteriorly at the right apex.
The lungs are clear.

Mild degenerative changes of the cervical spine are most prominent
at C5-6 on the left.  This is similar to the prior study.
IMPRESSION: 1.  Extensive inflammatory changes about the right submandibular
gland compatible with acute infection/inflammation.
2.  Focal area of hypoattenuation with surrounding peripheral
enhancement concerning for early abscess formation.  This could be
the posterior aspect of the duct although I do not see any definite
obstructing lesion.
3.  Stable slight increase in size of three large right
submandibular stones.
4.  Relatively stable appearance of two large stones within the
left submandibular gland.

## 2012-04-16 IMAGING — CR DG CHEST 2V
2 series · 2 of 2 positions shown · non-contrast
Comparison: 07/09/2006

CLINICAL DATA: Swollen glands and low grade fever.  Partial right
lobectomy previously

CHEST - 2 VIEW

[w chest lat]
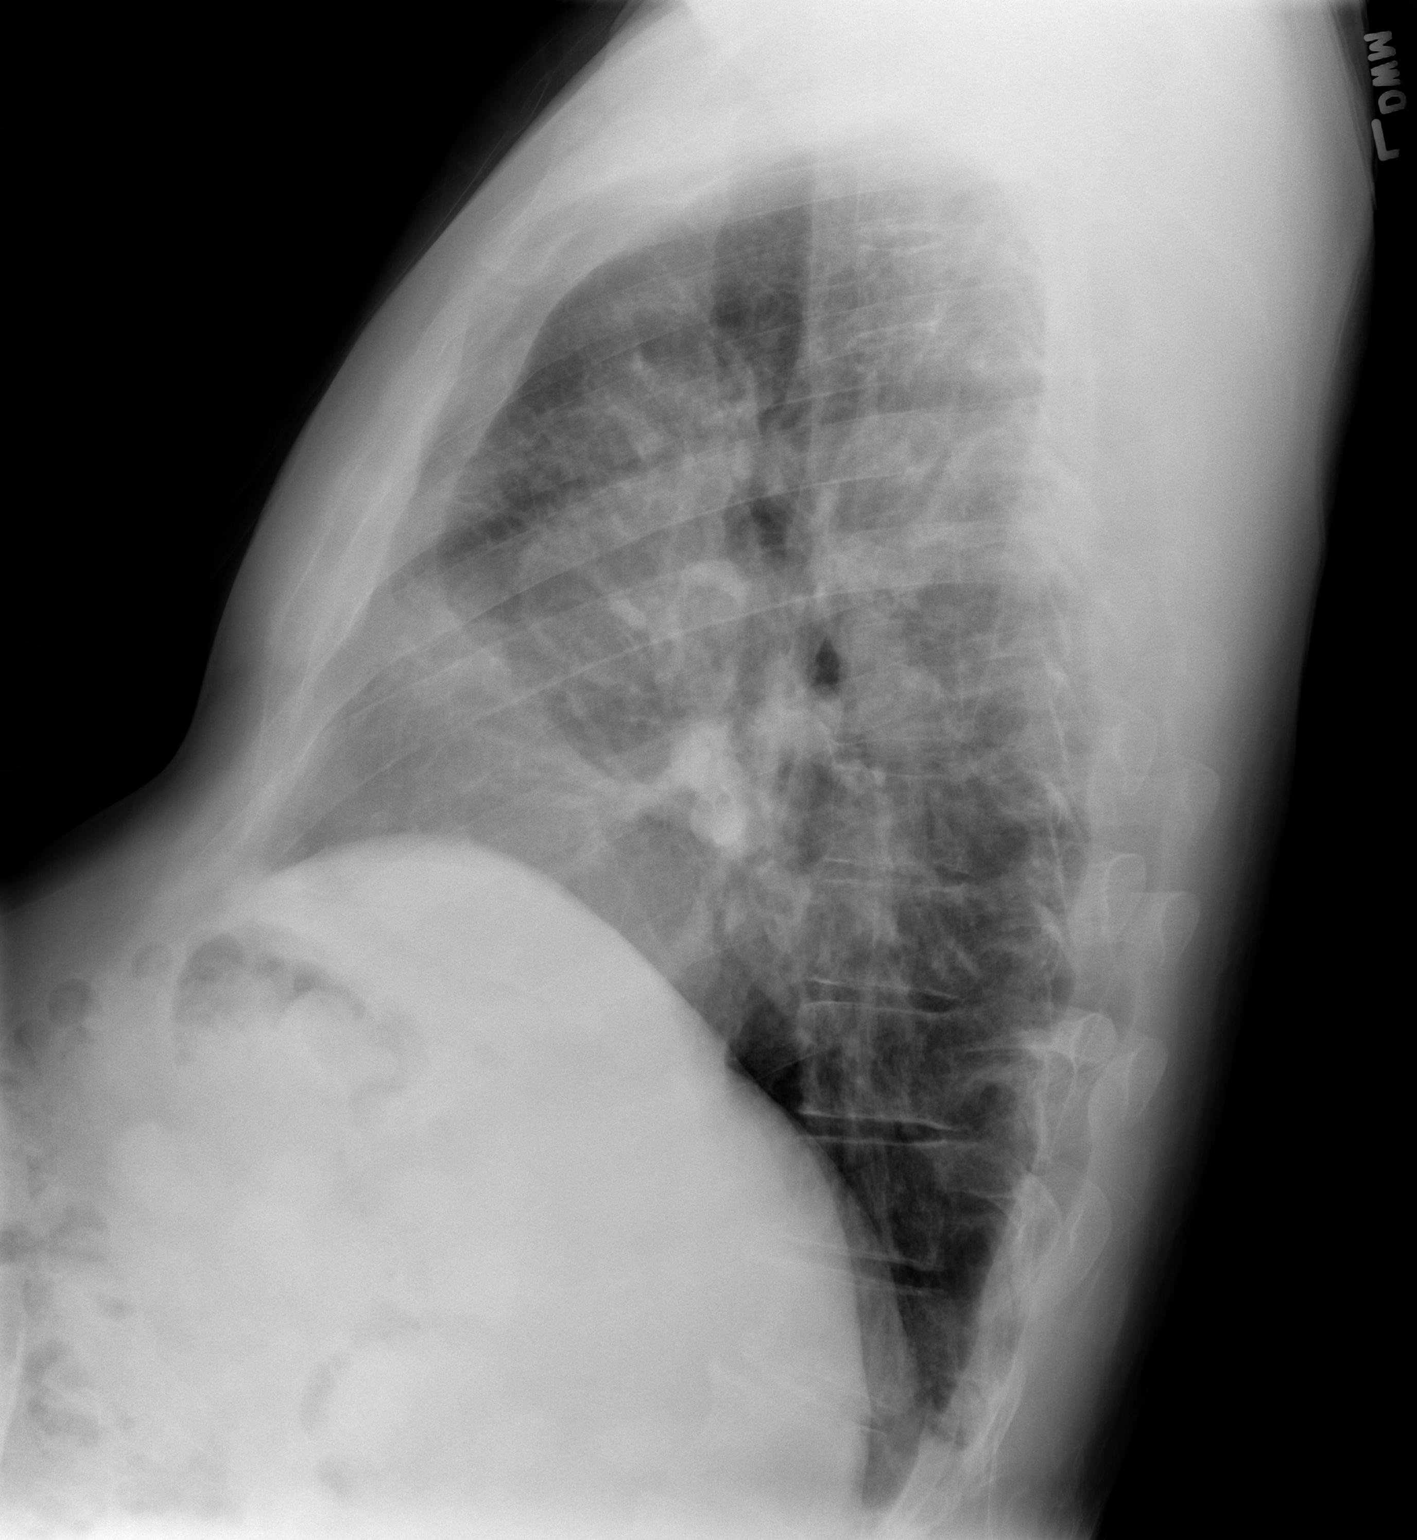

[w chest pa]
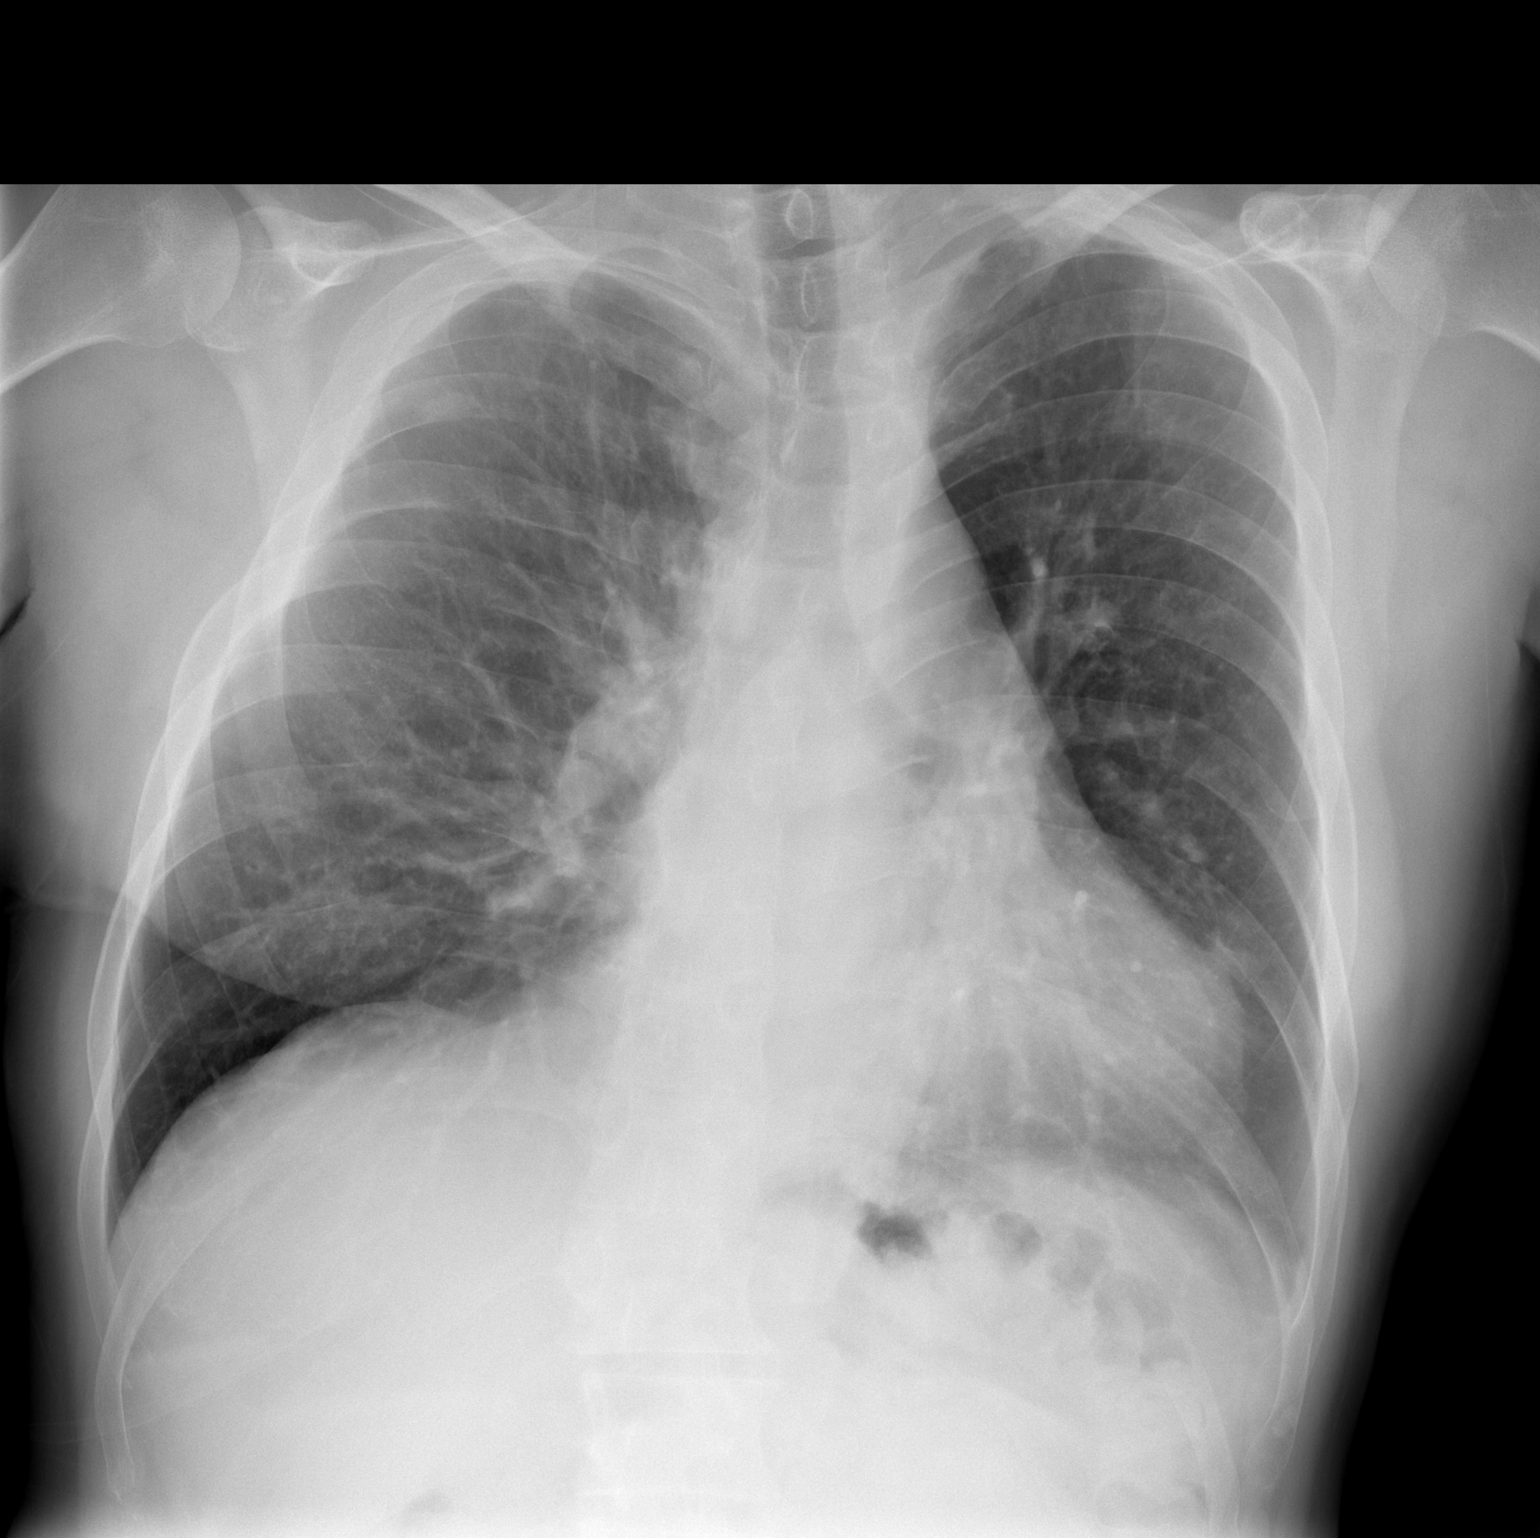

[2 of 2 positions shown; findings below may reference images not displayed]

FINDINGS: Heart and mediastinal contours are stable with pulmonary
vascular congestion and central peribronchial cuffing noted.
Coarseness of the interstitial markings are seen and are unchanged
in comparison with the prior exam compatible with chronic change.
The lung fields appear clear otherwise with no focal infiltrate or
signs of congestive failure. No interstitial septal lines are seen
to suggest early interstitial edema.  No pleural fluid is seen.
Stable bilateral apical pleural thickening is noted.

Bony structures are notable for a bone island associated with the
right fifth rib and are otherwise intact with unchanged curvature
of the thoracic spine.
IMPRESSION: Stable cardiopulmonary appearance with no new focal or acute
abnormality seen.

## 2012-04-21 ENCOUNTER — Telehealth: Payer: Self-pay | Admitting: Internal Medicine

## 2012-04-21 NOTE — Telephone Encounter (Signed)
Pt returned call. He will call back with that info when he locates it. Eric [name]

## 2012-04-26 NOTE — Telephone Encounter (Signed)
Pt's spouse Vernona Rieger called to add the following: Pt was seen at [facility] 05-13-11 by Dr. Janeann Merl. Eric [name]

## 2012-04-26 NOTE — Telephone Encounter (Signed)
I have completely filled out [employer] paperwork and faxed back. Original sent to HIM to scan in EPIC. Nothing more needed at this time.

## 2012-04-30 ENCOUNTER — Other Ambulatory Visit: Payer: Self-pay | Admitting: Internal Medicine

## 2012-05-20 ENCOUNTER — Ambulatory Visit: Payer: BC Managed Care – PPO | Admitting: Internal Medicine

## 2012-06-16 ENCOUNTER — Telehealth: Payer: Self-pay | Admitting: Internal Medicine

## 2012-06-16 NOTE — Telephone Encounter (Signed)
i spoke with pt. He is scheduled to come in tomorrow at 2:15 to see [name]. Nothing further was needed

## 2012-06-17 ENCOUNTER — Ambulatory Visit (INDEPENDENT_AMBULATORY_CARE_PROVIDER_SITE_OTHER)
Admission: RE | Admit: 2012-06-17 | Discharge: 2012-06-17 | Disposition: A | Discharge status: Home / Self Care | Payer: BC Managed Care – PPO | Source: Ambulatory Visit | Attending: Internal Medicine | Admitting: Internal Medicine

## 2012-06-17 ENCOUNTER — Ambulatory Visit (INDEPENDENT_AMBULATORY_CARE_PROVIDER_SITE_OTHER): Payer: BC Managed Care – PPO | Admitting: Internal Medicine

## 2012-06-17 ENCOUNTER — Encounter: Payer: Self-pay | Admitting: Internal Medicine

## 2012-06-17 VITALS — BP 118/80 | HR 89 | Ht 69.0 in | Wt 169.8 lb

## 2012-06-17 DIAGNOSIS — J449 Chronic obstructive pulmonary disease, unspecified: Secondary | ICD-10-CM

## 2012-06-17 MED ORDER — PREDNISONE 10 MG PO TABS: ORAL_TABLET | ORAL | Status: DC

## 2012-06-17 NOTE — Progress Notes (Signed)
Patient ID: Eric [name], male    DOB: [birth year], 57 y.o.   MRN: [id number]  HPI 4/ 3/12- 44 yo never smoker with hx chronic obstructive asthma, now on Xolair. Xolair is being dosed 150 mg per month, but he feels it wearing off by 3 weeks and asks about a dosing change. He remains on prednisone 5 mg daily. At 3 weeks after a xolair dose he begins to feel tighter, more wheeze. He had to miss work today because of asthma and he increased prednisone today to 20 mg. . He denies obvious change with seasonal pollens- no itch, sneeze,watery eyes.   10/24/10- 75 yo never smoker with hx chronic obstructive asthma, now on Xolair. Has continued Xoalir monthly. One injection/ month lasted few weeks-not a month. Increased to 2 injections/ month, same total dose,  and not as clearly helping. Has continued chronic maintenance prednisone. We discussed allergic vs nonallergic triggers. He is watching what happens as we enter Fall season. He had to miss work due to asthma flare 2 days ago and was aggresively using neb. Trigger was lawnmower smoke in garage.  Today is great. Currently on prednisone 20 mg daily. He remembers bone density check by Dr Lavone Orn within last few years.  We discussed Daliresp and he is interested in giving it another try.   04/18/11- 27 yo never smoker with hx chronic obstructive asthma, now on Xolair. He has been trying to get to lower maintenance prednisone dose and asked for higher Xolair dose. We increased him 300-> Xolair 375 mg  monthly.    Wife here  FOLLOWS FOR: been out of work for 3 days as of today-doesnt want to go up past prednisone 15mg  (was on 30mg ),using Zyflo. Wheezing increased-getting up in middle of night to do breathing tx's.   He was on prednisone 40 mg daily for several weeks before trying to drop down. Using nebulizer at night. Concerned about increased body fat from prednisone. We have had many long conversations about steroid side effects and alternatives over  the years. Theophylline did work when he was in college. Office spirometry: Severe obstructive airways disease. FEV1 1.29/35%, FEV1/FEC 0.46.  06/23/11- 56 yo never smoker with hx chronic obstructive asthma, now on Xolair. We had referred him to do, with the expectation he might be considered for thermal bronchoscopy to treat his asthma. They're treating him for reflux with Prilosec twice daily. He feels a Xolair 375 mg is helping after 2 doses, the way the first few doses did at 275 mg. He was just able to reduce his prednisone to 5 mg daily with mild residual wheeze. We reviewed findings associated with steroid withdrawal and adrenal insufficiency and discussed endocrinology referral to help with this issue.  08/20/11- 53 yo never smoker with hx chronic obstructive asthma, now on Xolair. Pt c/o increased SOB, chest tightness and prod cough x 1 week  Pt tx with Pred 30 mg x1 week then increased to 50mg  qd x 2days. Pt denies f/c/s.  Pt states even with the prednisone he cannot get relief. He has been on 30 mg of prednisone for most of this spring on a daily basis, usually with some wheeze but able to work. In the last day or 2 chest tightness and wheeze have been worse without specific reason. He had to increase prednisone to 50 mg daily. He continues Xolair injections. No recent colds or obvious exposures. Scant phlegm with trace yellow. He denies sinus problems. Taking Prilosec twice daily as  directed by the pulmonologist we sent him to at [facility]. We sent him specifically for evaluation of thermoplasty. He apparently is a little outside of their selection criteria but they're considering him. He has missed work intermittently. There is no part-time work available but he feels unable to do a 40 hour work week anymore  09/17/11- 82 yo never smoker with hx chronic obstructive asthma, now on Xolair.  Pt states he has notices a lot of improvement while on the Pred taper (now on 10mg ). Pt c/o slight wheezing and  sob, but not too concerned. Pt denies chest tightness, prod cough.  Discussed his work with the pulmonologist at [facility]. They emphasize management of his GERD, demonstrated with barium swallow. They plan PFTs at his next visit so we won't do them here. He has been out of work now for 3 months. 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 He continues Brovana and Atrovent nebulizers twice daily with rare need now for rescue inhaler. He did like Arcapta and we compared that to his nebulizer. He failed Singulair and Zyflo.  11/07/11- 61 yo never smoker with hx chronic obstructive asthma, ended Xolair June, 2013.    Wife here. Still having increased SOB-currently on 8mg  Prednisone-stilll out of work. Wheezing as well and cough. Recent exacer- improved after doxy. Has been able to maintain on prednisone 8 mg daily but no lower. [facility] said not good candidate for thermoplasty. Short term disability through Sept 11.  Long discussion of options, meds. Will try Brovana neb in AM/ Arcapta in PM.   12/10/11- 61 yo never smoker with hx chronic obstructive asthma, ended Xolair June, 2013.    Wife here. Still having SOB and wheezing at times; still out of work. Can tolerate light to moderate exertion on some days, but on other days wheezing dyspnea is too limiting. He is pleased to be would remain on only 8 mg daily prednisone now for 2 months at that dose. We have carefully discussed long-term steroid side effects. He asks extension of leave of absence one more month and has applied for total and permanent disability. Now out of work x3 months. No longer being followed at [facility] pulmonary. COPD assessment test (CAT) score 22/40.  01/07/12- 47 yo never smoker with hx chronic obstructive asthma, ended Xolair June, 2013.  Breathing no better but no worse than last time He considers that a good thing that he has been able to hold prednisone maintenance at 8 milligrams  daily. Exertional dyspnea easy fatigue little cough no chest pain or fever. He talks about the possibility of going back to work but it doesn't seem realistic. We agreed to extend his short term disability. CXR 12/30/11-    IMPRESSION:  1. Stable cardiomegaly with now mild pulmonary vascular  congestion.  2. Stable scarring at the lung apices bilaterally.  Original Report Authenticated By: Resa Miner. MATTERN, M.D.   04/08/12- 52 yo never smoker with hx chronic obstructive asthma, ended Xolair June, 2013.  FOLLOWS FOR: wheezing; has gotten worse in past 2-3 weeks even after albuterol and atrovent this morning. Acute exacerbation x 2-3 weeks-? Weather, but has not had a cold. Using all meds, incl used neb/ Duoneb 2 hours before arival this AM.  Remains out of work appropriately since June, 2013. Chronic asthma with COPD, steroid dependent trying not to go over 8 mg prednisone daily. Baseline daily wheeze and dyspnea, with occasional exacerbation. We have been able to keep him out  of hospital because he is good at self-management.  Failed Daliresp and Xolair. Several second-opinion visits to [facility] in 2013. They did not feel he was thermoplasty candidate.  Office spirometry 04/08/12- severe obstructive airways disease- FVC 2.64/58%, FEV1 1.30/36%, FEV1/FVC 0.49, FEF 25-75% 0.50/14%.  06/17/12- 84 yo never smoker with hx chronic obstructive asthma, ended Xolair June, 2013. ACUTE VISIT: chest tightness-feels more tightness in left side of chest, increased wheezing and SOB as well. Coughing-forces cough to bring mucus up. Worse asthma x2 weeks without fever, sore throat, itching or sneezing area and started herself back on Singulair. He has been on 8 mg of Medrol daily which is as low as he has been able to get in a long time. Using his nebulizer but seemed bring up scant clear mucus. He has not been taking Proventil tablets and has not tried the sample of Breo Ellipta.  Review of Systems-See  HPI Constitutional:   No weight loss, night sweats,  Fevers, chills, fatigue, lassitude. HEENT:   No headaches,  Difficulty swallowing,  Tooth/dental problems,  Sore throat,                No sneezing, itching, ear ache, No-nasal congestion, post nasal drip,  CV:  No chest pain, orthopnea, PND, swelling in lower extremities, anasarca, dizziness, palpitations GI  No heartburn, indigestion, abdominal pain, nausea, vomiting, Resp:   No excess mucus, no productive cough,  + non-productive cough,  No coughing up of blood.  No change in color of mucus.  + wheezing. Short of breath mainly w/ exertion.  Skin: no rash or lesions. GU:  MS:  No joint pain or swelling. Marland Kitchen Psych:  No change in mood or affect. No depression or anxiety.  No memory loss.  Objective:   Physical Exam BP 118/80  Pulse 89  Ht 5\' 9"  (1.753 m)  Wt 169 lb 12.8 oz (77.021 kg)  BMI 25.06 kg/m2  SpO2 91% General- Alert, Oriented, Affect-appropriate, Distress- none acute, but tripod and wheeze.  Skin- rash-none, lesions- none, excoriation- none Lymphadenopathy- none Head- atraumatic            Eyes- Gross vision intact, PERRLA, conjunctivae- clear secretions            Ears- Hearing, canals normal            Nose- Clear, no-Septal dev, mucus, polyps, erosion, perforation             Throat- Mallampati II , mucosa clear , drainage- none, tonsils- atrophic.  Neck- flexible , trachea midline, no stridor , thyroid nl, carotid no bruit Chest - symmetrical excursion , unlabored           Heart/CV- RRR , no murmur , no gallop  , no rub, nl s1 s2                           - JVD- none , edema- none, stasis changes- none, varices- none           Lung-  + wheeze R>L, able to speak full sentences.  cough- none , dullness-none, rub- none.            Chest wall-  Abd- Br/ Gen/ Rectal- Not done, not indicated Extrem- cyanosis- none, clubbing, none, atrophy- none, strength- nl Neuro- grossly intact to observation

## 2012-06-17 NOTE — Patient Instructions (Addendum)
Script sent for a prednisone taper. As you finish that, you can go back to maintenance prednisone or medrol.  The equivalence is 4 mg medrol = 5 mg prednisone,   8 mg medrol = 10 mg prednisone  Order- CXR dx exacerbation asthma with COPD

## 2012-06-22 NOTE — Progress Notes (Signed)
Quick Note:  Called spoke with patient, advised of cxr results / recs as stated by CY. Pt verbalized his understanding and denied any questions. ______ 

## 2012-06-25 NOTE — Assessment & Plan Note (Addendum)
Acute exacerbation, nonspecific. This seems pretty intense to be from pollen allergy so I suspect a virus infection. We discussed use of steroids with refill. Plan-prednisone burst and taper. Chest x-ray.

## 2012-06-28 ENCOUNTER — Ambulatory Visit: Payer: BC Managed Care – PPO | Admitting: Internal Medicine

## 2012-06-29 ENCOUNTER — Ambulatory Visit: Payer: BC Managed Care – PPO | Admitting: Internal Medicine

## 2012-07-16 ENCOUNTER — Telehealth: Payer: Self-pay | Admitting: Internal Medicine

## 2012-07-16 NOTE — Telephone Encounter (Signed)
lmomtcb x1 

## 2012-07-19 NOTE — Telephone Encounter (Signed)
[name], pt's spouse is aware that papers are complete (per you) and ready to be faxed. Please fax to the # at top of forms. Hazel Sams

## 2012-07-19 NOTE — Telephone Encounter (Signed)
Eric Addison do you know status of these forms? Please advise thanks

## 2012-07-19 NOTE — Telephone Encounter (Signed)
Done

## 2012-07-19 NOTE — Telephone Encounter (Signed)
Eric [name] returned call. Eric [name]

## 2012-07-26 ENCOUNTER — Telehealth: Payer: Self-pay | Admitting: Internal Medicine

## 2012-07-26 MED ORDER — FLUTICASONE FUROATE-VILANTEROL 100-25 MCG/INH IN AEPB: 1.0000 | INHALATION_SPRAY | Freq: Every day | RESPIRATORY_TRACT | Status: DC

## 2012-07-26 NOTE — Telephone Encounter (Signed)
Per CY-ok to pick up sample of Breo 1 puff and Rinse once daily .

## 2012-07-26 NOTE — Telephone Encounter (Signed)
1 sample of Breo up front for pick up Pt's spouse notified

## 2012-07-26 NOTE — Telephone Encounter (Signed)
Pt feels that the sample he was given of breo seems to have helped . Would like at least 1 more sample to make sure before he gets an rx. Allergies  Allergen Reactions  . Erythromycin Ethylsuccinate     REACTION: unspecified  . Penicillins    Dr Maple Hudson please advise Thank you

## 2012-07-29 ENCOUNTER — Telehealth: Payer: Self-pay | Admitting: Internal Medicine

## 2012-07-29 MED ORDER — FLUTICASONE FUROATE-VILANTEROL 100-25 MCG/INH IN AEPB: 1.0000 | INHALATION_SPRAY | Freq: Every day | RESPIRATORY_TRACT | Status: DC

## 2012-07-29 NOTE — Telephone Encounter (Signed)
rx sent and pt spouse is aware. Carron Curie, CMA

## 2012-08-04 ENCOUNTER — Telehealth: Payer: Self-pay | Admitting: Internal Medicine

## 2012-08-04 NOTE — Telephone Encounter (Signed)
Will wait for forms to be faxed.

## 2012-08-06 NOTE — Telephone Encounter (Signed)
Forms done.

## 2012-08-06 NOTE — Telephone Encounter (Signed)
Forms and message on CY's cart to fill out.

## 2012-08-07 ENCOUNTER — Other Ambulatory Visit: Payer: Self-pay | Admitting: Internal Medicine

## 2012-08-09 ENCOUNTER — Telehealth: Payer: Self-pay | Admitting: Internal Medicine

## 2012-08-09 MED ORDER — PREDNISONE 10 MG PO TABS: ORAL_TABLET | ORAL | Status: DC

## 2012-08-09 MED ORDER — FLUTICASONE PROPIONATE 50 MCG/ACT NA SUSP: 2.0000 | Freq: Every day | NASAL | Status: DC

## 2012-08-09 NOTE — Telephone Encounter (Signed)
[name] please advise regarding forms thanks

## 2012-08-09 NOTE — Telephone Encounter (Signed)
lmomtcb x1 

## 2012-08-09 NOTE — Telephone Encounter (Signed)
Patient returning call.

## 2012-08-09 NOTE — Telephone Encounter (Signed)
Vernona Rieger aware that all forms have been completed and faxed back as requested. Nothing more needed. Will sign off on message.

## 2012-08-09 NOTE — Telephone Encounter (Signed)
Pt called to ensure we rec'd forms.  I advised pt that we did receive the forms.  Pt's wife asks that these get faxed to the number on the top of the form.  Please advise when complete.  Antionette Fairy

## 2012-08-09 NOTE — Telephone Encounter (Signed)
Per CY-okay to give Prednisone 10 mg #20 take 4 x 2 days, 3 x 2 days, 2 x 2 days, 1 x 2 days no refills and okay to refill Flonase x 1 year.

## 2012-08-09 NOTE — Telephone Encounter (Signed)
Rxs were sent to pharm  Pt aware  Nothing further needed 

## 2012-08-09 NOTE — Telephone Encounter (Signed)
I spoke with pt and he is requesting a pred taper to [facility] [place] [facility] to [facility] in case he needs it. He is also requesting refill on flonase. Please advise [name] thanks Last ov 06/17/12 10/18/12 pending

## 2012-08-10 ENCOUNTER — Other Ambulatory Visit: Payer: Self-pay | Admitting: Internal Medicine

## 2012-09-16 ENCOUNTER — Other Ambulatory Visit: Payer: Self-pay | Admitting: Internal Medicine

## 2012-09-24 ENCOUNTER — Telehealth: Payer: Self-pay | Admitting: Internal Medicine

## 2012-09-24 NOTE — Telephone Encounter (Signed)
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 Please advise. Carron Curie, CMA

## 2012-09-24 NOTE — Telephone Encounter (Signed)
Yes- just need to know the specifics of how often, how long she may miss work etc, that the form asks for.

## 2012-09-24 NOTE — Telephone Encounter (Signed)
LMTCBx1.Jennifer Castillo, CMA  

## 2012-09-24 NOTE — Telephone Encounter (Signed)
Pt spouse returned triage's call.  She states it is ok if not able to call until Monday.  Eric [name]

## 2012-09-27 NOTE — Telephone Encounter (Signed)
Spoke with the pt's spouse and notified of recs per [name] She verbalized understanding She states that when it is time, she will bring the forms for [name] to fill out and have specifics on amount of time she may be expected to be out per month Nothing further needed per pt

## 2012-09-27 NOTE — Telephone Encounter (Signed)
Pt is calling back & can be reached at [phone].  Eric [name]

## 2012-09-30 ENCOUNTER — Telehealth: Payer: Self-pay | Admitting: Internal Medicine

## 2012-09-30 NOTE — Telephone Encounter (Signed)
Spoke with patients spouse Vernona Rieger-- she will be faxing over FMLA papers downstairs Would like Dr.Youngs nurse to be on the lookout for these For patient FMLA she states her employer is in agreement for patient to work 32 hrs per week and have 8 hrs per week if time that could be used to help patient (ER visit,appts,surgeries etc) Would like [name] to call her once FMLA papers are received so clarify if needed-- can be reached @ cell [phone] Will forward to [name] so she is aware

## 2012-09-30 NOTE — Telephone Encounter (Signed)
Doesn't pop up for [place] about being dismissed from this department-unsure of why but pt is not dismissed from our office.

## 2012-09-30 NOTE — Telephone Encounter (Signed)
When opening pt chart I received a caution that "pt has been dismissed from this department". I spoke with [name] and she stated to forward to her and she will see.

## 2012-10-06 NOTE — Telephone Encounter (Signed)
CY have you seen any FMLA papers come to your attention. I have not seen any papers. Thanks.

## 2012-10-06 NOTE — Telephone Encounter (Signed)
We did something months ago. Nothing recent.

## 2012-10-08 NOTE — Telephone Encounter (Signed)
I spoke with the pt spouse and she states she came in last Friday, paid $25, and dropped off the forms personally. So I called back to [employer] and spoke with Marcelino Duster and she states she is not the normal person that works in this area and is not sure where else to look. Eber Jones will be back on Tuesday and she is normally who work in [employer]. I will forward message to [name] to check on this again on Tuesday with [employer]. Carron Curie, CMA

## 2012-10-08 NOTE — Telephone Encounter (Signed)
I spoke with Marcelino Duster in [employer] and she states they do not have any record of receiving forms for this pt. I LMTCBx1 to speak with pt wife and let her know paperwork has not been received. Carron Curie, CMA

## 2012-10-08 NOTE — Telephone Encounter (Signed)
I have called [employer] to check to see if they have ever received these papers. They will check and call back. Carron Curie, CMA

## 2012-10-12 NOTE — Telephone Encounter (Signed)
Pt's spouse Vernona Rieger wants to know status of FMLA papers. [phone] Hazel Sams

## 2012-10-12 NOTE — Telephone Encounter (Signed)
LMTCBx1 with [employer] to see where they are in the process. Carron Curie, CMA

## 2012-10-12 NOTE — Telephone Encounter (Signed)
LMOVM-I will speak with [employer] in AM as I am not aware of any FMLA papers on patients.

## 2012-10-12 NOTE — Telephone Encounter (Signed)
Pt's spouse called back again re: status of FMLA. Eric [name]

## 2012-10-13 NOTE — Telephone Encounter (Signed)
Eric [name], were you personally going to call [employer]?

## 2012-10-14 NOTE — Telephone Encounter (Signed)
Please advise [name] thanks 

## 2012-10-14 NOTE — Telephone Encounter (Signed)
After several calls to [employer] and no knowledge of any papers I got in touch with Eber Jones ([employer] rep) and she states that she received FMLA papers on the patient today. She is unsure of what is needed at this time as she did not have the papers in front of her at the time of our conversation. LMTCB for Vernona Rieger (pts wife) to give her the [employer]'s number in the even she has questions or concerns. Their number is [phone] ext [phone].

## 2012-10-15 ENCOUNTER — Telehealth: Payer: Self-pay | Admitting: Internal Medicine

## 2012-10-15 ENCOUNTER — Ambulatory Visit (INDEPENDENT_AMBULATORY_CARE_PROVIDER_SITE_OTHER)
Admission: RE | Admit: 2012-10-15 | Discharge: 2012-10-15 | Disposition: A | Discharge status: Home / Self Care | Payer: BC Managed Care – PPO | Source: Ambulatory Visit | Attending: Internal Medicine | Admitting: Internal Medicine

## 2012-10-15 DIAGNOSIS — S2239XA Fracture of one rib, unspecified side, initial encounter for closed fracture: Secondary | ICD-10-CM

## 2012-10-15 DIAGNOSIS — S2232XA Fracture of one rib, left side, initial encounter for closed fracture: Secondary | ICD-10-CM

## 2012-10-15 NOTE — Telephone Encounter (Signed)
Pt c/o rib pain x 3 days. Pt states that he heard a loud "pop" when leaning over side of boat. Pt c/o pain/soreness on lower left side. Pt denies difficulty breathing.   Pt requesting recs. Would like know if he needs to call PCP and schedule appt with them. Please advise Dr Maple Hudson. Thanks.

## 2012-10-15 NOTE — Telephone Encounter (Signed)
Per [name]:  We need to clarify if pt is having SOB.  Original msg states it is hard to breathe and below msg pt denies breathing difficulties.  Also, he suggests pt come in today for a cxr with left rib details for ? Rib fx. ----- Called, spoke with pt.  Pt states he is not having any difficulty breathing at this time.  States he can also take a deep breath with no difficulty right now.  He will come in today for xray. Order placed.  Pt aware and voiced no further questions or concerns at this time.

## 2012-10-15 NOTE — Telephone Encounter (Signed)
I called and spoke with Eric [name] and made her aware. She will give them a call and will sign off message

## 2012-10-15 NOTE — Progress Notes (Signed)
Quick Note:  Spoke with patient Made him aware of results per [name] Patient verbalized understanding Nothing further needed at this time ______

## 2012-10-15 NOTE — Telephone Encounter (Signed)
Notes Recorded by Waymon Budge, MD on 10/15/2012 at 12:54 PM Left Rib Xrays- do not show a displaced rib fracture, but wouldn't see if just cracked. There is a small fluid collection "pleural effusion" which might be from irritation which could also cause pain there.  Suggest treat as cracked rib- Tylenol or Advil, local heat. If it doesn't get better over next few days, let us know. ----------------- Spoke with patient-- Patient aware of results and recs Patient verbalized understanding and nothing further needed at this time

## 2012-10-18 ENCOUNTER — Ambulatory Visit: Payer: BC Managed Care – PPO | Admitting: Internal Medicine

## 2012-11-03 ENCOUNTER — Ambulatory Visit (INDEPENDENT_AMBULATORY_CARE_PROVIDER_SITE_OTHER): Payer: 59 | Admitting: Internal Medicine

## 2012-11-03 ENCOUNTER — Encounter: Payer: Self-pay | Admitting: Internal Medicine

## 2012-11-03 VITALS — BP 122/76 | HR 81 | Ht 69.0 in | Wt 173.2 lb

## 2012-11-03 DIAGNOSIS — J449 Chronic obstructive pulmonary disease, unspecified: Secondary | ICD-10-CM

## 2012-11-03 MED ORDER — MOMETASONE FURO-FORMOTEROL FUM 200-5 MCG/ACT IN AERO: 2.0000 | INHALATION_SPRAY | Freq: Two times a day (BID) | RESPIRATORY_TRACT | Status: DC

## 2012-11-03 MED ORDER — MONTELUKAST SODIUM 10 MG PO TABS: 10.0000 mg | ORAL_TABLET | Freq: Every day | ORAL | Status: DC

## 2012-11-03 NOTE — Progress Notes (Signed)
 Patient ID: Eric [name], male    DOB: 08/27/1955, 57 y.o.   MRN: [id number]  HPI 4/ 3/12- 4 yo never smoker with hx chronic obstructive asthma, now on Xolair. Xolair is being dosed 150 mg per month, but he feels it wearing off by 3 weeks and asks about a dosing change. He remains on prednisone 5 mg daily. At 3 weeks after a xolair dose he begins to feel tighter, more wheeze. He had to miss work today because of asthma and he increased prednisone today to 20 mg. . He denies obvious change with seasonal pollens- no itch, sneeze,watery eyes.   10/24/10- 57 yo never smoker with hx chronic obstructive asthma, now on Xolair. Has continued Xoalir monthly. One injection/ month lasted few weeks-not a month. Increased to 2 injections/ month, same total dose,  and not as clearly helping. Has continued chronic maintenance prednisone. We discussed allergic vs nonallergic triggers. He is watching what happens as we enter Fall season. He had to miss work due to asthma flare 2 days ago and was aggresively using neb. Trigger was lawnmower smoke in garage.  Today is great. Currently on prednisone 20 mg daily. He remembers bone density check by Dr John Griffin within last few years.  We discussed Daliresp and he is interested in giving it another try.   04/18/11- 57 yo never smoker with hx chronic obstructive asthma, now on Xolair. He has been trying to get to lower maintenance prednisone dose and asked for higher Xolair dose. We increased him 300-> Xolair 375 mg [place] monthly.    Wife here  FOLLOWS FOR: been out of work for 3 days as of today-doesnt want to go up past prednisone 15mg (was on 30mg),using Zyflo. Wheezing increased-getting up in middle of night to do breathing tx's.   He was on prednisone 40 mg daily for several weeks before trying to drop down. Using nebulizer at night. Concerned about increased body fat from prednisone. We have had many long conversations about steroid side effects and alternatives over  the years. Theophylline did work when he was in college. Office spirometry: Severe obstructive airways disease. FEV1 1.29/35%, FEV1/FEC 0.46.  06/23/11- 57 yo never smoker with hx chronic obstructive asthma, now on Xolair. We had referred him to do, with the expectation he might be considered for thermal bronchoscopy to treat his asthma. They're treating him for reflux with Prilosec twice daily. He feels a Xolair 375 mg is helping after 2 doses, the way the first few doses did at 275 mg. He was just able to reduce his prednisone to 5 mg daily with mild residual wheeze. We reviewed findings associated with steroid withdrawal and adrenal insufficiency and discussed endocrinology referral to help with this issue.  08/20/11- 57 yo never smoker with hx chronic obstructive asthma, now on Xolair. Pt c/o increased SOB, chest tightness and prod cough x 1 week  Pt tx with Pred 30 mg x1 week then increased to 50mg qd x 2days. Pt denies f/c/s.  Pt states even with the prednisone he cannot get relief. He has been on 30 mg of prednisone for most of this spring on a daily basis, usually with some wheeze but able to work. In the last day or 2 chest tightness and wheeze have been worse without specific reason. He had to increase prednisone to 50 mg daily. He continues Xolair injections. No recent colds or obvious exposures. Scant phlegm with trace yellow. He denies sinus problems. Taking Prilosec twice daily as   directed by the pulmonologist we sent him to at [facility]. We sent him specifically for evaluation of thermoplasty. He apparently is a little outside of their selection criteria but they're considering him. He has missed work intermittently. There is no part-time work available but he feels unable to do a 40 hour work week anymore  09/17/11- 57 yo never smoker with hx chronic obstructive asthma, now on Xolair.  Pt states he has notices a lot of improvement while on the Pred taper (now on 10mg). Pt c/o slight wheezing and  sob, but not too concerned. Pt denies chest tightness, prod cough.  Discussed his work with the pulmonologist at [facility]. They emphasize management of his GERD, demonstrated with barium swallow. They plan PFTs at his next visit so we won't do them here. He has been out of work now for 3 months. 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 He continues Brovana and Atrovent nebulizers twice daily with rare need now for rescue inhaler. He did like Arcapta and we compared that to his nebulizer. He failed Singulair and Zyflo.  11/07/11- 55 yo never smoker with hx chronic obstructive asthma, ended Xolair June, 2013.    Wife here. Still having increased SOB-currently on 8mg Prednisone-stilll out of work. Wheezing as well and cough. Recent exacer- improved after doxy. Has been able to maintain on prednisone 8 mg daily but no lower. [facility] said not good candidate for thermoplasty. Short term disability through Sept 11.  Long discussion of options, meds. Will try Brovana neb in AM/ Arcapta in PM.   12/10/11- 56 yo never smoker with hx chronic obstructive asthma, ended Xolair June, 2013.    Wife here. Still having SOB and wheezing at times; still out of work. Can tolerate light to moderate exertion on some days, but on other days wheezing dyspnea is too limiting. He is pleased to be would remain on only 8 mg daily prednisone now for 2 months at that dose. We have carefully discussed long-term steroid side effects. He asks extension of leave of absence one more month and has applied for total and permanent disability. Now out of work x3 months. No longer being followed at [facility]. COPD assessment test (CAT) score 22/40.  01/07/12- 56 yo never smoker with hx chronic obstructive asthma, ended Xolair June, 2013.  Breathing no better but no worse than last time He considers that a good thing that he has been able to hold prednisone maintenance at 8 milligrams  daily. Exertional dyspnea easy fatigue little cough no chest pain or fever. He talks about the possibility of going back to work but it doesn't seem realistic. We agreed to extend his short term disability. CXR 12/30/11-    IMPRESSION:  1. Stable cardiomegaly with now mild pulmonary vascular  congestion.  2. Stable scarring at the lung apices bilaterally.  Original Report Authenticated By: CHRISTOPHER W. MATTERN, M.D.   04/08/12- 56 yo never smoker with hx chronic obstructive asthma, ended Xolair June, 2013.  FOLLOWS FOR: wheezing; has gotten worse in past 2-3 weeks even after albuterol and atrovent this morning. Acute exacerbation x 2-3 weeks-? Weather, but has not had a cold. Using all meds, incl used neb/ Duoneb 2 hours before arival this AM.  Remains out of work appropriately since June, 2013. Chronic asthma with COPD, steroid dependent trying not to go over 8 mg prednisone daily. Baseline daily wheeze and dyspnea, with occasional exacerbation. We have been able to keep him out   of hospital because he is good at self-management.  Failed Daliresp and Xolair. Several second-opinion visits to [facility] in 2013. They did not feel he was thermoplasty candidate.  Office spirometry 04/08/12- severe obstructive airways disease- FVC 2.64/58%, FEV1 1.30/36%, FEV1/FVC 0.49, FEF 25-75% 0.50/14%.  06/17/12- 56 yo never smoker with hx chronic obstructive asthma, ended Xolair June, 2013. ACUTE VISIT: chest tightness-feels more tightness in left side of chest, increased wheezing and SOB as well. Coughing-forces cough to bring mucus up. Worse asthma x2 weeks without fever, sore throat, itching or sneezing area and started herself back on Singulair. He has been on 8 mg of Medrol daily which is as low as he has been able to get in a long time. Using his nebulizer but seemed bring up scant clear mucus. He has not been taking Proventil tablets and has not tried the sample of Breo Ellipta.  11/03/12- 56 yo never  smoker with hx chronic obstructive asthma, ended Xolair June, 2013. GERD FOLLOWS FOR: breathing not the best; SOB and wheezing, chest congestion, cough -tightness in chest at times(able to get phlegm up after breathing tx's sometimes) Daily wheeze, cough, short of breath at baseline. Nebulizer produces clear sputum. Now has disability. Continues prednisone 10 mg daily. Occasionally aware of throat burning reflux despite Prilosec twice a day. CXR 06/17/12 IMPRESSION:  No acute finding.  Original Report Authenticated By: Thomas D'Alessio, M.D.  Review of Systems-See HPI Constitutional:   No weight loss, night sweats,  Fevers, chills, fatigue, lassitude. HEENT:   No headaches,  Difficulty swallowing,  Tooth/dental problems,  Sore throat,                No sneezing, itching, ear ache, No-nasal congestion, post nasal drip,  CV:  No chest pain, orthopnea, PND, swelling in lower extremities, anasarca, dizziness, palpitations GI  +heartburn,+indigestion, no-abdominal pain, nausea, vomiting, Resp:   No excess mucus, no-productive cough,  + non-productive cough,  No coughing up of blood.  No change in color of mucus.  + wheezing. Short of breath mainly w/ exertion.  Skin: no rash or lesions. GU:  MS:  No joint pain or swelling. . Psych:  No change in mood or affect. No depression or anxiety.  No memory loss.  Objective:   Physical Exam  General- Alert, Oriented, Affect-appropriate, Distress- none acute, but tripod and wheeze.  Skin- rash-none, lesions- none, excoriation- none Lymphadenopathy- none Head- atraumatic            Eyes- Gross vision intact, PERRLA, conjunctivae- clear secretions            Ears- Hearing, canals normal            Nose- Clear, no-Septal dev, mucus, polyps, erosion, perforation             Throat- Mallampati II , mucosa clear , drainage- none, tonsils- atrophic.  Neck- flexible , trachea midline, no stridor , thyroid nl, carotid no bruit Chest - symmetrical excursion ,  unlabored           Heart/CV- RRR , no murmur , no gallop  , no rub, nl s1 s2                           - JVD- none , edema- none, stasis changes- none, varices- none           Lung-  + bilateral wheeze R>L, unlabored, able to speak full sentences.  cough- none , dullness-none,   rub- none.            Chest wall-  Abd- Br/ Gen/ Rectal- Not done, not indicated Extrem- cyanosis- none, clubbing, none, atrophy- none, strength- nl Neuro- grossly intact to observation           

## 2012-11-03 NOTE — Patient Instructions (Addendum)
Ok to continue prednisone 10 mg daily  Stop the Qvar  Sample and script for Dulera 200 maintenance inhaler    2 puffs then rinse mouth, twice daily

## 2012-11-14 NOTE — Assessment & Plan Note (Signed)
Severe fixed chronic asthma Plan-refill Singulair. Try Elwin Sleight

## 2012-11-23 ENCOUNTER — Other Ambulatory Visit: Payer: Self-pay | Admitting: Dermatology

## 2012-12-06 ENCOUNTER — Telehealth: Payer: Self-pay | Admitting: Internal Medicine

## 2012-12-06 NOTE — Telephone Encounter (Signed)
Spoke with wife to patient; Pt was seen by PCP for physical and they suggested possible Pulmonary Rehab. PCP will order this. Wife would like to know if we could change number on frequency pt would need to be seen so this will cover the first few visits for patient(wife would like to go as well). Wife is aware that I will forward message to [facility] and call her possibly tomorrow-wife states no rush as she needs to ck with insurance to see if they will cover Pulm Rehab.   CY Please advise. Thanks.

## 2012-12-09 ENCOUNTER — Other Ambulatory Visit: Payer: Self-pay | Admitting: Internal Medicine

## 2012-12-09 MED ORDER — MONTELUKAST SODIUM 10 MG PO TABS: 10.0000 mg | ORAL_TABLET | Freq: Every day | ORAL | Status: DC

## 2012-12-13 NOTE — Telephone Encounter (Signed)
Done

## 2012-12-13 NOTE — Telephone Encounter (Signed)
Forms sent to [employer].

## 2012-12-13 NOTE — Telephone Encounter (Signed)
Eric [name] from [employer] is requesting to speak to Eric [name] about pt's spouse's FMLA form.  Her extension is [phone]. Eric [name]

## 2012-12-13 NOTE — Telephone Encounter (Signed)
I spoke with Eber Jones. She stated the wife had more forms sent to them. Eber Jones stated she has not filled out this out bc this is the same form that was filled out. She spoke with the spouse adnw as advised she has already discuss with [name] what is needed. Please advise [name] and [name] thanks

## 2012-12-14 ENCOUNTER — Telehealth: Payer: Self-pay | Admitting: Internal Medicine

## 2012-12-14 ENCOUNTER — Other Ambulatory Visit: Payer: Self-pay | Admitting: Internal Medicine

## 2012-12-14 NOTE — Telephone Encounter (Signed)
According to epic forms were sent down to [employer] yesterday.  I called and spoke with spouse and made her aware forms were sent back down to [employer] yesterday. She will call them down stairs to ensure this is faxed. Nothing further needed

## 2012-12-14 NOTE — Telephone Encounter (Signed)
Gwen from [facility] wants to speak to nurse about fmla papers.Raylene Everts

## 2013-02-01 ENCOUNTER — Telehealth: Payer: Self-pay

## 2013-02-01 MED ORDER — LEVOFLOXACIN 750 MG PO TABS: 750.0000 mg | ORAL_TABLET | Freq: Every day | ORAL | Status: DC

## 2013-02-01 NOTE — Telephone Encounter (Signed)
i called and spoke with pt. Aware of recs. rx called in. Nothing further needed

## 2013-02-01 NOTE — Telephone Encounter (Signed)
I called and spoke with pt. He c/o productive cough w/ yellow-green phlem, slight nasal congestion, slight PND, chest tx, wheezing. He tried doxy and finished this Sunday. Please advise [name] thanks  Allergies  Allergen Reactions  . Erythromycin Ethylsuccinate     REACTION: unspecified  . Penicillins

## 2013-02-01 NOTE — Telephone Encounter (Signed)
Per CY-okay to give patient Levaquin 750 mg #7 take 1 po QD no refills.

## 2013-03-07 ENCOUNTER — Other Ambulatory Visit: Payer: Self-pay | Admitting: Internal Medicine

## 2013-03-15 NOTE — Progress Notes (Deleted)
Patient ID: Eric [name], male    DOB: [birth year], 58 y.o.   MRN: [id number]  HPI 4/ 3/12- 44 yo never smoker with hx chronic obstructive asthma, now on Xolair. Xolair is being dosed 150 mg per month, but he feels it wearing off by 3 weeks and asks about a dosing change. He remains on prednisone 5 mg daily. At 3 weeks after a xolair dose he begins to feel tighter, more wheeze. He had to miss work today because of asthma and he increased prednisone today to 20 mg. . He denies obvious change with seasonal pollens- no itch, sneeze,watery eyes.   10/24/10- 75 yo never smoker with hx chronic obstructive asthma, now on Xolair. Has continued Xoalir monthly. One injection/ month lasted few weeks-not a month. Increased to 2 injections/ month, same total dose,  and not as clearly helping. Has continued chronic maintenance prednisone. We discussed allergic vs nonallergic triggers. He is watching what happens as we enter Fall season. He had to miss work due to asthma flare 2 days ago and was aggresively using neb. Trigger was lawnmower smoke in garage.  Today is great. Currently on prednisone 20 mg daily. He remembers bone density check by Dr Lavone Orn within last few years.  We discussed Daliresp and he is interested in giving it another try.   04/18/11- 27 yo never smoker with hx chronic obstructive asthma, now on Xolair. He has been trying to get to lower maintenance prednisone dose and asked for higher Xolair dose. We increased him 300-> Xolair 375 mg  monthly.    Wife here  FOLLOWS FOR: been out of work for 3 days as of today-doesnt want to go up past prednisone 15mg  (was on 30mg ),using Zyflo. Wheezing increased-getting up in middle of night to do breathing tx's.   He was on prednisone 40 mg daily for several weeks before trying to drop down. Using nebulizer at night. Concerned about increased body fat from prednisone. We have had many long conversations about steroid side effects and alternatives over  the years. Theophylline did work when he was in college. Office spirometry: Severe obstructive airways disease. FEV1 1.29/35%, FEV1/FEC 0.46.  06/23/11- 56 yo never smoker with hx chronic obstructive asthma, now on Xolair. We had referred him to do, with the expectation he might be considered for thermal bronchoscopy to treat his asthma. They're treating him for reflux with Prilosec twice daily. He feels a Xolair 375 mg is helping after 2 doses, the way the first few doses did at 275 mg. He was just able to reduce his prednisone to 5 mg daily with mild residual wheeze. We reviewed findings associated with steroid withdrawal and adrenal insufficiency and discussed endocrinology referral to help with this issue.  08/20/11- 53 yo never smoker with hx chronic obstructive asthma, now on Xolair. Pt c/o increased SOB, chest tightness and prod cough x 1 week  Pt tx with Pred 30 mg x1 week then increased to 50mg  qd x 2days. Pt denies f/c/s.  Pt states even with the prednisone he cannot get relief. He has been on 30 mg of prednisone for most of this spring on a daily basis, usually with some wheeze but able to work. In the last day or 2 chest tightness and wheeze have been worse without specific reason. He had to increase prednisone to 50 mg daily. He continues Xolair injections. No recent colds or obvious exposures. Scant phlegm with trace yellow. He denies sinus problems. Taking Prilosec twice daily as  directed by the pulmonologist we sent him to at [facility]. We sent him specifically for evaluation of thermoplasty. He apparently is a little outside of their selection criteria but they're considering him. He has missed work intermittently. There is no part-time work available but he feels unable to do a 40 hour work week anymore  09/17/11- 82 yo never smoker with hx chronic obstructive asthma, now on Xolair.  Pt states he has notices a lot of improvement while on the Pred taper (now on 10mg ). Pt c/o slight wheezing and  sob, but not too concerned. Pt denies chest tightness, prod cough.  Discussed his work with the pulmonologist at [facility]. They emphasize management of his GERD, demonstrated with barium swallow. They plan PFTs at his next visit so we won't do them here. He has been out of work now for 3 months. 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 He continues Brovana and Atrovent nebulizers twice daily with rare need now for rescue inhaler. He did like Arcapta and we compared that to his nebulizer. He failed Singulair and Zyflo.  11/07/11- 61 yo never smoker with hx chronic obstructive asthma, ended Xolair June, 2013.    Wife here. Still having increased SOB-currently on 8mg  Prednisone-stilll out of work. Wheezing as well and cough. Recent exacer- improved after doxy. Has been able to maintain on prednisone 8 mg daily but no lower. [facility] said not good candidate for thermoplasty. Short term disability through Sept 11.  Long discussion of options, meds. Will try Brovana neb in AM/ Arcapta in PM.   12/10/11- 61 yo never smoker with hx chronic obstructive asthma, ended Xolair June, 2013.    Wife here. Still having SOB and wheezing at times; still out of work. Can tolerate light to moderate exertion on some days, but on other days wheezing dyspnea is too limiting. He is pleased to be would remain on only 8 mg daily prednisone now for 2 months at that dose. We have carefully discussed long-term steroid side effects. He asks extension of leave of absence one more month and has applied for total and permanent disability. Now out of work x3 months. No longer being followed at [facility] pulmonary. COPD assessment test (CAT) score 22/40.  01/07/12- 47 yo never smoker with hx chronic obstructive asthma, ended Xolair June, 2013.  Breathing no better but no worse than last time He considers that a good thing that he has been able to hold prednisone maintenance at 8 milligrams  daily. Exertional dyspnea easy fatigue little cough no chest pain or fever. He talks about the possibility of going back to work but it doesn't seem realistic. We agreed to extend his short term disability. CXR 12/30/11-    IMPRESSION:  1. Stable cardiomegaly with now mild pulmonary vascular  congestion.  2. Stable scarring at the lung apices bilaterally.  Original Report Authenticated By: Resa Miner. MATTERN, M.D.   04/08/12- 52 yo never smoker with hx chronic obstructive asthma, ended Xolair June, 2013.  FOLLOWS FOR: wheezing; has gotten worse in past 2-3 weeks even after albuterol and atrovent this morning. Acute exacerbation x 2-3 weeks-? Weather, but has not had a cold. Using all meds, incl used neb/ Duoneb 2 hours before arival this AM.  Remains out of work appropriately since June, 2013. Chronic asthma with COPD, steroid dependent trying not to go over 8 mg prednisone daily. Baseline daily wheeze and dyspnea, with occasional exacerbation. We have been able to keep him out  of hospital because he is good at self-management.  Failed Daliresp and Xolair. Several second-opinion visits to [facility] in 2013. They did not feel he was thermoplasty candidate.  Office spirometry 04/08/12- severe obstructive airways disease- FVC 2.64/58%, FEV1 1.30/36%, FEV1/FVC 0.49, FEF 25-75% 0.50/14%.  06/17/12- 41 yo never smoker with hx chronic obstructive asthma, ended Xolair June, 2013. ACUTE VISIT: chest tightness-feels more tightness in left side of chest, increased wheezing and SOB as well. Coughing-forces cough to bring mucus up. Worse asthma x2 weeks without fever, sore throat, itching or sneezing area and started herself back on Singulair. He has been on 8 mg of Medrol daily which is as low as he has been able to get in a long time. Using his nebulizer but seemed bring up scant clear mucus. He has not been taking Proventil tablets and has not tried the sample of Breo Ellipta.  11/03/12- 49 yo never  smoker with hx chronic obstructive asthma, ended Xolair June, 2013. GERD FOLLOWS FOR: breathing not the best; SOB and wheezing, chest congestion, cough -tightness in chest at times(able to get phlegm up after breathing tx's sometimes) Daily wheeze, cough, short of breath at baseline. Nebulizer produces clear sputum. Now has disability. Continues prednisone 10 mg daily. Occasionally aware of throat burning reflux despite Prilosec twice a day. CXR 06/17/12 IMPRESSION:  No acute finding.  Original Report Authenticated By: Orlean Patten, M.D.  Review of Systems-See HPI Constitutional:   No weight loss, night sweats,  Fevers, chills, fatigue, lassitude. HEENT:   No headaches,  Difficulty swallowing,  Tooth/dental problems,  Sore throat,                No sneezing, itching, ear ache, No-nasal congestion, post nasal drip,  CV:  No chest pain, orthopnea, PND, swelling in lower extremities, anasarca, dizziness, palpitations GI  +heartburn,+indigestion, no-abdominal pain, nausea, vomiting, Resp:   No excess mucus, no-productive cough,  + non-productive cough,  No coughing up of blood.  No change in color of mucus.  + wheezing. Short of breath mainly w/ exertion.  Skin: no rash or lesions. GU:  MS:  No joint pain or swelling. Marland Kitchen Psych:  No change in mood or affect. No depression or anxiety.  No memory loss.  Objective:   Physical Exam  General- Alert, Oriented, Affect-appropriate, Distress- none acute, but tripod and wheeze.  Skin- rash-none, lesions- none, excoriation- none Lymphadenopathy- none Head- atraumatic            Eyes- Gross vision intact, PERRLA, conjunctivae- clear secretions            Ears- Hearing, canals normal            Nose- Clear, no-Septal dev, mucus, polyps, erosion, perforation             Throat- Mallampati II , mucosa clear , drainage- none, tonsils- atrophic.  Neck- flexible , trachea midline, no stridor , thyroid nl, carotid no bruit Chest - symmetrical excursion ,  unlabored           Heart/CV- RRR , no murmur , no gallop  , no rub, nl s1 s2                           - JVD- none , edema- none, stasis changes- none, varices- none           Lung-  + bilateral wheeze R>L, unlabored, able to speak full sentences.  cough- none , dullness-none,  rub- none.            Chest wall-  Abd- Br/ Gen/ Rectal- Not done, not indicated Extrem- cyanosis- none, clubbing, none, atrophy- none, strength- nl Neuro- grossly intact to observation

## 2013-03-16 ENCOUNTER — Encounter: Payer: Self-pay | Admitting: Internal Medicine

## 2013-03-16 ENCOUNTER — Ambulatory Visit (INDEPENDENT_AMBULATORY_CARE_PROVIDER_SITE_OTHER): Payer: 59 | Admitting: Internal Medicine

## 2013-03-16 VITALS — BP 120/74 | HR 91 | Ht 69.0 in | Wt 172.8 lb

## 2013-03-16 DIAGNOSIS — J449 Chronic obstructive pulmonary disease, unspecified: Secondary | ICD-10-CM

## 2013-03-16 DIAGNOSIS — J4489 Other specified chronic obstructive pulmonary disease: Secondary | ICD-10-CM

## 2013-03-16 MED ORDER — IPRATROPIUM BROMIDE 0.02 % IN SOLN: 500.0000 ug | Freq: Four times a day (QID) | RESPIRATORY_TRACT | Status: DC

## 2013-03-16 MED ORDER — METHYLPREDNISOLONE ACETATE 80 MG/ML IJ SUSP
80.0000 mg | Freq: Once | INTRAMUSCULAR | Status: AC
  Administered 2013-03-16: 80 mg via INTRAMUSCULAR

## 2013-03-16 MED ORDER — ALBUTEROL SULFATE HFA 108 (90 BASE) MCG/ACT IN AERS: INHALATION_SPRAY | RESPIRATORY_TRACT | Status: DC

## 2013-03-16 NOTE — Progress Notes (Signed)
Patient ID: AUM CAGGIANO, male    DOB: [birth year], 58 y.o.   MRN: [id number]  HPI 4/ 3/12- 44 yo never smoker with hx chronic obstructive asthma, now on Xolair. Xolair is being dosed 150 mg per month, but he feels it wearing off by 3 weeks and asks about a dosing change. He remains on prednisone 5 mg daily. At 3 weeks after a xolair dose he begins to feel tighter, more wheeze. He had to miss work today because of asthma and he increased prednisone today to 20 mg. . He denies obvious change with seasonal pollens- no itch, sneeze,watery eyes.   10/24/10- 75 yo never smoker with hx chronic obstructive asthma, now on Xolair. Has continued Xoalir monthly. One injection/ month lasted few weeks-not a month. Increased to 2 injections/ month, same total dose,  and not as clearly helping. Has continued chronic maintenance prednisone. We discussed allergic vs nonallergic triggers. He is watching what happens as we enter Fall season. He had to miss work due to asthma flare 2 days ago and was aggresively using neb. Trigger was lawnmower smoke in garage.  Today is great. Currently on prednisone 20 mg daily. He remembers bone density check by Dr Lavone Orn within last few years.  We discussed Daliresp and he is interested in giving it another try.   04/18/11- 27 yo never smoker with hx chronic obstructive asthma, now on Xolair. He has been trying to get to lower maintenance prednisone dose and asked for higher Xolair dose. We increased him 300-> Xolair 375 mg  monthly.    Wife here  FOLLOWS FOR: been out of work for 3 days as of today-doesnt want to go up past prednisone 15mg  (was on 30mg ),using Zyflo. Wheezing increased-getting up in middle of night to do breathing tx's.   He was on prednisone 40 mg daily for several weeks before trying to drop down. Using nebulizer at night. Concerned about increased body fat from prednisone. We have had many long conversations about steroid side effects and alternatives over  the years. Theophylline did work when he was in college. Office spirometry: Severe obstructive airways disease. FEV1 1.29/35%, FEV1/FEC 0.46.  06/23/11- 56 yo never smoker with hx chronic obstructive asthma, now on Xolair. We had referred him to do, with the expectation he might be considered for thermal bronchoscopy to treat his asthma. They're treating him for reflux with Prilosec twice daily. He feels a Xolair 375 mg is helping after 2 doses, the way the first few doses did at 275 mg. He was just able to reduce his prednisone to 5 mg daily with mild residual wheeze. We reviewed findings associated with steroid withdrawal and adrenal insufficiency and discussed endocrinology referral to help with this issue.  08/20/11- 53 yo never smoker with hx chronic obstructive asthma, now on Xolair. Pt c/o increased SOB, chest tightness and prod cough x 1 week  Pt tx with Pred 30 mg x1 week then increased to 50mg  qd x 2days. Pt denies f/c/s.  Pt states even with the prednisone he cannot get relief. He has been on 30 mg of prednisone for most of this spring on a daily basis, usually with some wheeze but able to work. In the last day or 2 chest tightness and wheeze have been worse without specific reason. He had to increase prednisone to 50 mg daily. He continues Xolair injections. No recent colds or obvious exposures. Scant phlegm with trace yellow. He denies sinus problems. Taking Prilosec twice daily as  directed by the pulmonologist we sent him to at [facility]. We sent him specifically for evaluation of thermoplasty. He apparently is a little outside of their selection criteria but they're considering him. He has missed work intermittently. There is no part-time work available but he feels unable to do a 40 hour work week anymore  09/17/11- 82 yo never smoker with hx chronic obstructive asthma, now on Xolair.  Pt states he has notices a lot of improvement while on the Pred taper (now on 10mg ). Pt c/o slight wheezing and  sob, but not too concerned. Pt denies chest tightness, prod cough.  Discussed his work with the pulmonologist at [facility]. They emphasize management of his GERD, demonstrated with barium swallow. They plan PFTs at his next visit so we won't do them here. He has been out of work now for 3 months. 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 He continues Brovana and Atrovent nebulizers twice daily with rare need now for rescue inhaler. He did like Arcapta and we compared that to his nebulizer. He failed Singulair and Zyflo.  11/07/11- 61 yo never smoker with hx chronic obstructive asthma, ended Xolair June, 2013.    Wife here. Still having increased SOB-currently on 8mg  Prednisone-stilll out of work. Wheezing as well and cough. Recent exacer- improved after doxy. Has been able to maintain on prednisone 8 mg daily but no lower. [facility] said not good candidate for thermoplasty. Short term disability through Sept 11.  Long discussion of options, meds. Will try Brovana neb in AM/ Arcapta in PM.   12/10/11- 61 yo never smoker with hx chronic obstructive asthma, ended Xolair June, 2013.    Wife here. Still having SOB and wheezing at times; still out of work. Can tolerate light to moderate exertion on some days, but on other days wheezing dyspnea is too limiting. He is pleased to be would remain on only 8 mg daily prednisone now for 2 months at that dose. We have carefully discussed long-term steroid side effects. He asks extension of leave of absence one more month and has applied for total and permanent disability. Now out of work x3 months. No longer being followed at [facility] pulmonary. COPD assessment test (CAT) score 22/40.  01/07/12- 47 yo never smoker with hx chronic obstructive asthma, ended Xolair June, 2013.  Breathing no better but no worse than last time He considers that a good thing that he has been able to hold prednisone maintenance at 8 milligrams  daily. Exertional dyspnea easy fatigue little cough no chest pain or fever. He talks about the possibility of going back to work but it doesn't seem realistic. We agreed to extend his short term disability. CXR 12/30/11-    IMPRESSION:  1. Stable cardiomegaly with now mild pulmonary vascular  congestion.  2. Stable scarring at the lung apices bilaterally.  Original Report Authenticated By: Resa Miner. MATTERN, M.D.   04/08/12- 52 yo never smoker with hx chronic obstructive asthma, ended Xolair June, 2013.  FOLLOWS FOR: wheezing; has gotten worse in past 2-3 weeks even after albuterol and atrovent this morning. Acute exacerbation x 2-3 weeks-? Weather, but has not had a cold. Using all meds, incl used neb/ Duoneb 2 hours before arival this AM.  Remains out of work appropriately since June, 2013. Chronic asthma with COPD, steroid dependent trying not to go over 8 mg prednisone daily. Baseline daily wheeze and dyspnea, with occasional exacerbation. We have been able to keep him out  of hospital because he is good at self-management.  Failed Daliresp and Xolair. Several second-opinion visits to [facility] in 2013. They did not feel he was thermoplasty candidate.  Office spirometry 04/08/12- severe obstructive airways disease- FVC 2.64/58%, FEV1 1.30/36%, FEV1/FVC 0.49, FEF 25-75% 0.50/14%.  06/17/12- 45 yo never smoker with hx chronic obstructive asthma, ended Xolair June, 2013. ACUTE VISIT: chest tightness-feels more tightness in left side of chest, increased wheezing and SOB as well. Coughing-forces cough to bring mucus up. Worse asthma x2 weeks without fever, sore throat, itching or sneezing area and started herself back on Singulair. He has been on 8 mg of Medrol daily which is as low as he has been able to get in a long time. Using his nebulizer but seemed bring up scant clear mucus. He has not been taking Proventil tablets and has not tried the sample of Breo Ellipta.  11/03/12- 12 yo never  smoker with hx chronic obstructive asthma, ended Xolair June, 2013. GERD FOLLOWS FOR: breathing not the best; SOB and wheezing, chest congestion, cough -tightness in chest at times(able to get phlegm up after breathing tx's sometimes) Daily wheeze, cough, short of breath at baseline. Nebulizer produces clear sputum. Now has disability. Continues prednisone 10 mg daily. Occasionally aware of throat burning reflux despite Prilosec twice a day. CXR 06/17/12 IMPRESSION:  No acute finding.  Original Report Authenticated By: Orlean Patten, M.D.  03/16/13- 66 yo never smoker with hx chronic obstructive asthma, ended Xolair June, 2013. GERD FOLLOWS ZWC:HENID like he is on the "fence" with his breathing being on Pred 10 mg and Dulera daily; unsure if he needs a shot of depo; can hear him wheezing(he states this happens all day long) Levaquin worked well. Now back on prednisone maintenance 10 mg daily plus Dulera but wheezing every day.h eartburn controlled. CXR 06/17/12 IMPRESSION:  No acute finding.  Original Report Authenticated By: Orlean Patten, M.D.  Review of Systems-See HPI Constitutional:   No weight loss, night sweats,  Fevers, chills, fatigue, lassitude. HEENT:   No headaches,  Difficulty swallowing,  Tooth/dental problems,  Sore throat,                No sneezing, itching, ear ache, No-nasal congestion, post nasal drip,  CV:  No chest pain, orthopnea, PND, swelling in lower extremities, anasarca, dizziness, palpitations GI  +heartburn,+indigestion, no-abdominal pain, nausea, vomiting, Resp:   No excess mucus, no-productive cough,  + non-productive cough,  No coughing up of blood.  No change in color of mucus.  + wheezing. Short of breath mainly w/ exertion.  Skin: no rash or lesions. GU:  MS:  No joint pain or swelling. Marland Kitchen Psych:  No change in mood or affect. No depression or anxiety.  No memory loss.  Objective:   Physical Exam  General- Alert, Oriented, Affect-appropriate,  Distress- none acute, but tripod and wheeze.  Skin- rash-none, lesions- none, excoriation- none Lymphadenopathy- none Head- atraumatic            Eyes- Gross vision intact, PERRLA, conjunctivae- clear secretions            Ears- Hearing, canals normal            Nose- Clear, no-Septal dev, mucus, polyps, erosion, perforation             Throat- Mallampati II , mucosa clear , drainage- none, tonsils- atrophic.  Neck- flexible , trachea midline, no stridor , thyroid nl, carotid no bruit Chest - symmetrical excursion , unlabored  Heart/CV- RRR , no murmur , no gallop  , no rub, nl s1 s2                           - JVD- none , edema- none, stasis changes- none, varices- none           Lung-  + bilateral wheeze , able to speak full sentences.  Cough+ , dullness-none, rub- none.            Chest wall-  Abd- Br/ Gen/ Rectal- Not done, not indicated Extrem- cyanosis- none, clubbing, none, atrophy- none, strength- nl Neuro- grossly intact to observation

## 2013-03-16 NOTE — Patient Instructions (Signed)
Scripts to refill ventolin and ipratropium sent  Depo 80

## 2013-04-09 ENCOUNTER — Encounter: Payer: Self-pay | Admitting: Internal Medicine

## 2013-04-09 NOTE — Assessment & Plan Note (Signed)
Steroid dependent. Exacerbation, nonspecific but at this time of year we suspect viral. Plan-Depo-Medrol, refill prednisone with discussion

## 2013-04-25 ENCOUNTER — Other Ambulatory Visit: Payer: Self-pay | Admitting: Internal Medicine

## 2013-07-21 ENCOUNTER — Ambulatory Visit (INDEPENDENT_AMBULATORY_CARE_PROVIDER_SITE_OTHER): Payer: 59 | Admitting: Internal Medicine

## 2013-07-21 ENCOUNTER — Encounter: Payer: Self-pay | Admitting: Internal Medicine

## 2013-07-21 VITALS — BP 118/68 | HR 77 | Ht 69.0 in | Wt 172.8 lb

## 2013-07-21 DIAGNOSIS — J309 Allergic rhinitis, unspecified: Secondary | ICD-10-CM

## 2013-07-21 DIAGNOSIS — J4489 Other specified chronic obstructive pulmonary disease: Secondary | ICD-10-CM

## 2013-07-21 DIAGNOSIS — J329 Chronic sinusitis, unspecified: Secondary | ICD-10-CM

## 2013-07-21 DIAGNOSIS — J330 Polyp of nasal cavity: Secondary | ICD-10-CM

## 2013-07-21 DIAGNOSIS — J449 Chronic obstructive pulmonary disease, unspecified: Secondary | ICD-10-CM

## 2013-07-21 DIAGNOSIS — J302 Other seasonal allergic rhinitis: Secondary | ICD-10-CM

## 2013-07-21 DIAGNOSIS — J3089 Other allergic rhinitis: Secondary | ICD-10-CM

## 2013-07-21 MED ORDER — METHYLPREDNISOLONE ACETATE 80 MG/ML IJ SUSP
80.0000 mg | Freq: Once | INTRAMUSCULAR | Status: AC
  Administered 2013-07-21: 80 mg via INTRAMUSCULAR

## 2013-07-21 MED ORDER — IPRATROPIUM BROMIDE 0.02 % IN SOLN: 500.0000 ug | Freq: Four times a day (QID) | RESPIRATORY_TRACT | Status: DC

## 2013-07-21 MED ORDER — SHORT RAGWEED POLLEN EXT 12 AMB A 1-U SL SUBL: SUBLINGUAL_TABLET | SUBLINGUAL | Status: DC

## 2013-07-21 MED ORDER — EPINEPHRINE 0.3 MG/0.3ML IJ SOAJ: 0.3000 mg | Freq: Once | INTRAMUSCULAR | Status: DC

## 2013-07-21 MED ORDER — PHENYLEPHRINE HCL 1 % NA SOLN
3.0000 [drp] | Freq: Once | NASAL | Status: AC
Start: 2013-07-21 — End: 2013-07-21
  Administered 2013-07-21: 3 [drp] via NASAL

## 2013-07-21 MED ORDER — FLUTICASONE PROPIONATE 50 MCG/ACT NA SUSP: NASAL | Status: DC

## 2013-07-21 MED ORDER — ALBUTEROL SULFATE (2.5 MG/3ML) 0.083% IN NEBU: INHALATION_SOLUTION | RESPIRATORY_TRACT | Status: DC

## 2013-07-21 NOTE — Progress Notes (Signed)
Patient ID: AUM CAGGIANO, male    DOB: [birth year], 58 y.o.   MRN: [id number]  HPI 4/ 3/12- 44 yo never smoker with hx chronic obstructive asthma, now on Xolair. Xolair is being dosed 150 mg per month, but he feels it wearing off by 3 weeks and asks about a dosing change. He remains on prednisone 5 mg daily. At 3 weeks after a xolair dose he begins to feel tighter, more wheeze. He had to miss work today because of asthma and he increased prednisone today to 20 mg. . He denies obvious change with seasonal pollens- no itch, sneeze,watery eyes.   10/24/10- 75 yo never smoker with hx chronic obstructive asthma, now on Xolair. Has continued Xoalir monthly. One injection/ month lasted few weeks-not a month. Increased to 2 injections/ month, same total dose,  and not as clearly helping. Has continued chronic maintenance prednisone. We discussed allergic vs nonallergic triggers. He is watching what happens as we enter Fall season. He had to miss work due to asthma flare 2 days ago and was aggresively using neb. Trigger was lawnmower smoke in garage.  Today is great. Currently on prednisone 20 mg daily. He remembers bone density check by Dr Lavone Orn within last few years.  We discussed Daliresp and he is interested in giving it another try.   04/18/11- 27 yo never smoker with hx chronic obstructive asthma, now on Xolair. He has been trying to get to lower maintenance prednisone dose and asked for higher Xolair dose. We increased him 300-> Xolair 375 mg  monthly.    Wife here  FOLLOWS FOR: been out of work for 3 days as of today-doesnt want to go up past prednisone 15mg  (was on 30mg ),using Zyflo. Wheezing increased-getting up in middle of night to do breathing tx's.   He was on prednisone 40 mg daily for several weeks before trying to drop down. Using nebulizer at night. Concerned about increased body fat from prednisone. We have had many long conversations about steroid side effects and alternatives over  the years. Theophylline did work when he was in college. Office spirometry: Severe obstructive airways disease. FEV1 1.29/35%, FEV1/FEC 0.46.  06/23/11- 56 yo never smoker with hx chronic obstructive asthma, now on Xolair. We had referred him to do, with the expectation he might be considered for thermal bronchoscopy to treat his asthma. They're treating him for reflux with Prilosec twice daily. He feels a Xolair 375 mg is helping after 2 doses, the way the first few doses did at 275 mg. He was just able to reduce his prednisone to 5 mg daily with mild residual wheeze. We reviewed findings associated with steroid withdrawal and adrenal insufficiency and discussed endocrinology referral to help with this issue.  08/20/11- 53 yo never smoker with hx chronic obstructive asthma, now on Xolair. Pt c/o increased SOB, chest tightness and prod cough x 1 week  Pt tx with Pred 30 mg x1 week then increased to 50mg  qd x 2days. Pt denies f/c/s.  Pt states even with the prednisone he cannot get relief. He has been on 30 mg of prednisone for most of this spring on a daily basis, usually with some wheeze but able to work. In the last day or 2 chest tightness and wheeze have been worse without specific reason. He had to increase prednisone to 50 mg daily. He continues Xolair injections. No recent colds or obvious exposures. Scant phlegm with trace yellow. He denies sinus problems. Taking Prilosec twice daily as  directed by the pulmonologist we sent him to at [facility]. We sent him specifically for evaluation of thermoplasty. He apparently is a little outside of their selection criteria but they're considering him. He has missed work intermittently. There is no part-time work available but he feels unable to do a 40 hour work week anymore  09/17/11- 82 yo never smoker with hx chronic obstructive asthma, now on Xolair.  Pt states he has notices a lot of improvement while on the Pred taper (now on 10mg ). Pt c/o slight wheezing and  sob, but not too concerned. Pt denies chest tightness, prod cough.  Discussed his work with the pulmonologist at [facility]. They emphasize management of his GERD, demonstrated with barium swallow. They plan PFTs at his next visit so we won't do them here. He has been out of work now for 3 months. 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 He continues Brovana and Atrovent nebulizers twice daily with rare need now for rescue inhaler. He did like Arcapta and we compared that to his nebulizer. He failed Singulair and Zyflo.  11/07/11- 61 yo never smoker with hx chronic obstructive asthma, ended Xolair June, 2013.    Wife here. Still having increased SOB-currently on 8mg  Prednisone-stilll out of work. Wheezing as well and cough. Recent exacer- improved after doxy. Has been able to maintain on prednisone 8 mg daily but no lower. [facility] said not good candidate for thermoplasty. Short term disability through Sept 11.  Long discussion of options, meds. Will try Brovana neb in AM/ Arcapta in PM.   12/10/11- 61 yo never smoker with hx chronic obstructive asthma, ended Xolair June, 2013.    Wife here. Still having SOB and wheezing at times; still out of work. Can tolerate light to moderate exertion on some days, but on other days wheezing dyspnea is too limiting. He is pleased to be would remain on only 8 mg daily prednisone now for 2 months at that dose. We have carefully discussed long-term steroid side effects. He asks extension of leave of absence one more month and has applied for total and permanent disability. Now out of work x3 months. No longer being followed at [facility] pulmonary. COPD assessment test (CAT) score 22/40.  01/07/12- 47 yo never smoker with hx chronic obstructive asthma, ended Xolair June, 2013.  Breathing no better but no worse than last time He considers that a good thing that he has been able to hold prednisone maintenance at 8 milligrams  daily. Exertional dyspnea easy fatigue little cough no chest pain or fever. He talks about the possibility of going back to work but it doesn't seem realistic. We agreed to extend his short term disability. CXR 12/30/11-    IMPRESSION:  1. Stable cardiomegaly with now mild pulmonary vascular  congestion.  2. Stable scarring at the lung apices bilaterally.  Original Report Authenticated By: Resa Miner. MATTERN, M.D.   04/08/12- 52 yo never smoker with hx chronic obstructive asthma, ended Xolair June, 2013.  FOLLOWS FOR: wheezing; has gotten worse in past 2-3 weeks even after albuterol and atrovent this morning. Acute exacerbation x 2-3 weeks-? Weather, but has not had a cold. Using all meds, incl used neb/ Duoneb 2 hours before arival this AM.  Remains out of work appropriately since June, 2013. Chronic asthma with COPD, steroid dependent trying not to go over 8 mg prednisone daily. Baseline daily wheeze and dyspnea, with occasional exacerbation. We have been able to keep him out  of hospital because he is good at self-management.  Failed Daliresp and Xolair. Several second-opinion visits to [facility] in 2013. They did not feel he was thermoplasty candidate.  Office spirometry 04/08/12- severe obstructive airways disease- FVC 2.64/58%, FEV1 1.30/36%, FEV1/FVC 0.49, FEF 25-75% 0.50/14%.  06/17/12- 48 yo never smoker with hx chronic obstructive asthma, ended Xolair June, 2013. ACUTE VISIT: chest tightness-feels more tightness in left side of chest, increased wheezing and SOB as well. Coughing-forces cough to bring mucus up. Worse asthma x2 weeks without fever, sore throat, itching or sneezing area and started herself back on Singulair. He has been on 8 mg of Medrol daily which is as low as he has been able to get in a long time. Using his nebulizer but seemed bring up scant clear mucus. He has not been taking Proventil tablets and has not tried the sample of Breo Ellipta.  11/03/12- 4 yo never  smoker with hx chronic obstructive asthma, ended Xolair June, 2013. GERD FOLLOWS FOR: breathing not the best; SOB and wheezing, chest congestion, cough -tightness in chest at times(able to get phlegm up after breathing tx's sometimes) Daily wheeze, cough, short of breath at baseline. Nebulizer produces clear sputum. Now has disability. Continues prednisone 10 mg daily. Occasionally aware of throat burning reflux despite Prilosec twice a day. CXR 06/17/12 IMPRESSION:  No acute finding.  Original Report Authenticated By: Orlean Patten, M.D.  03/16/13- 23 yo never smoker with hx chronic obstructive asthma, ended Xolair June, 2013. GERD FOLLOWS EVO:JJKKX like he is on the "fence" with his breathing being on Pred 10 mg and Dulera daily; unsure if he needs a shot of depo; can hear him wheezing(he states this happens all day long) Levaquin worked well. Now back on prednisone maintenance 10 mg daily plus Dulera but wheezing every day.h eartburn controlled. CXR 06/17/12 IMPRESSION:  No acute finding.  Original Report Authenticated By: Orlean Patten, M.D.  07/21/13- 38 yo never smoker with hx chronic obstructive asthma, ended Xolair June, 2013. GERD FOLLOWS FOR: P states his breathing is okay considering the weather and pollen. Head congestion, sinus pressure. He coughs some every day and uses nebulizer and rescue inhaler a couple of times per day to say and control. He expects seasonal flare with ragweed in the fall.  Review of Systems-See HPI Constitutional:   No weight loss, night sweats,  Fevers, chills, fatigue, lassitude. HEENT:   No headaches,  Difficulty swallowing,  Tooth/dental problems,  Sore throat,                No sneezing, itching, ear ache, No-nasal congestion, post nasal drip,  CV:  No chest pain, orthopnea, PND, swelling in lower extremities, anasarca, dizziness, palpitations GI  +heartburn,+indigestion, no-abdominal pain, nausea, vomiting, Resp:   No excess mucus, no-productive  cough,  + non-productive cough,  No coughing up of blood.  No change in color of mucus.  + wheezing. Short of breath mainly w/ exertion.  Skin: no rash or lesions. GU:  MS:  No joint pain or swelling. Marland Kitchen Psych:  No change in mood or affect. No depression or anxiety.  No memory loss.  Objective:   Physical Exam  General- Alert, Oriented, Affect-appropriate, Distress- none acute, but tripod and wheeze.  Skin- rash-none, lesions- none, excoriation- none Lymphadenopathy- none Head- atraumatic            Eyes- Gross vision intact, PERRLA, conjunctivae- clear secretions            Ears- Hearing, canals normal  Nose- +thick nasal mucus, no-Septal dev,  polyps, erosion, perforation             Throat- Mallampati II , mucosa clear , drainage- none, tonsils- atrophic.  Neck- flexible , trachea midline, no stridor , thyroid nl, carotid no bruit Chest - symmetrical excursion , unlabored           Heart/CV- RRR , no murmur , no gallop  , no rub, nl s1 s2                           - JVD- none , edema- none, stasis changes- none, varices- none           Lung-  + bilateral wheeze, unlabored , able to speak full sentences.  Cough+ ,                             dullness-none, rub- none.            Chest wall-  Abd- Br/ Gen/ Rectal- Not done, not indicated Extrem- cyanosis- none, clubbing, none, atrophy- none, strength- nl Neuro- grossly intact to observation

## 2013-07-21 NOTE — Patient Instructions (Signed)
Neb neo nasal  Depo 80  sample Dymista nasal spray    1-2 puffs each nostril once daily at bedtime     Try this instead of Flonase for comparison  Scripts for albuterol and ipratropium neb solutions and flonase sent to [facility] for [address]. Bring them here to take first dose where we can let you sit in waiting room for observation for 30--60 minutes first dose only. After that you can take it at home.

## 2013-08-02 ENCOUNTER — Telehealth: Payer: Self-pay | Admitting: Internal Medicine

## 2013-08-02 NOTE — Telephone Encounter (Signed)
LMOM x 1 

## 2013-08-03 ENCOUNTER — Other Ambulatory Visit: Payer: Self-pay | Admitting: Internal Medicine

## 2013-08-04 NOTE — Telephone Encounter (Signed)
Called and spoke with the pharmacy and they are faxing over the PA form for the pt.

## 2013-08-08 NOTE — Telephone Encounter (Signed)
Did you receive this form? It is not in triage. [place] Bing, CMA

## 2013-08-09 NOTE — Telephone Encounter (Signed)
Spouse is checking on this. I do not see anything in triage. Please advise Joellen Jersey thanks

## 2013-08-11 NOTE — Telephone Encounter (Signed)
Called and spoke with pts wife and she is aware CY recs and she will contact her husband to see which day next week is better for him and she will call us back so we can schedule the appt for him

## 2013-08-11 NOTE — Telephone Encounter (Signed)
Per CY-let patient know that he can have 1 sample and set up appt to start pill(will have to wait 30 minutes in office) and then if no troubles then we can send mail order RX. Otherwise we need to think of another treatment plan.

## 2013-08-11 NOTE — Telephone Encounter (Signed)
Spoke with Estill Bamberg at [employer] needs to go through mail order not Advertising copywriter. If the Rx goes through mail order then no PA will be needed. CY, please advise if you are okay with patient having sample from our office to start with and see how it works before going through mail order. Thanks.

## 2013-09-04 DIAGNOSIS — J3089 Other allergic rhinitis: Secondary | ICD-10-CM

## 2013-09-04 DIAGNOSIS — J302 Other seasonal allergic rhinitis: Secondary | ICD-10-CM | POA: Insufficient documentation

## 2013-09-04 NOTE — Assessment & Plan Note (Signed)
Exacerbation. Plan nasal nebulizer decongestant, Depo-Medrol. Sample Dymista. We discussed sublingual immunotherapy/ SLIT as an option for the fall ragweed season- Ragwitek.

## 2013-09-04 NOTE — Assessment & Plan Note (Signed)
Fixed obstructive asthma controlled

## 2013-09-04 NOTE — Assessment & Plan Note (Signed)
Polyps not seen at this visit

## 2013-09-12 ENCOUNTER — Other Ambulatory Visit: Payer: Self-pay | Admitting: Internal Medicine

## 2013-09-29 ENCOUNTER — Other Ambulatory Visit: Payer: Self-pay | Admitting: Internal Medicine

## 2013-09-29 MED ORDER — MOMETASONE FURO-FORMOTEROL FUM 200-5 MCG/ACT IN AERO: 2.0000 | INHALATION_SPRAY | Freq: Two times a day (BID) | RESPIRATORY_TRACT | Status: DC

## 2013-09-30 ENCOUNTER — Telehealth: Payer: Self-pay | Admitting: Internal Medicine

## 2013-09-30 NOTE — Telephone Encounter (Signed)
RX was sent yesterday to [facility].  Santiago Glad is aware. Nothing further needed

## 2013-10-06 ENCOUNTER — Other Ambulatory Visit: Payer: Self-pay | Admitting: Internal Medicine

## 2013-10-07 ENCOUNTER — Encounter: Payer: Self-pay | Admitting: Internal Medicine

## 2013-11-14 ENCOUNTER — Other Ambulatory Visit: Payer: Self-pay | Admitting: Internal Medicine

## 2013-11-21 ENCOUNTER — Encounter: Payer: Self-pay | Admitting: Internal Medicine

## 2013-11-21 ENCOUNTER — Ambulatory Visit (INDEPENDENT_AMBULATORY_CARE_PROVIDER_SITE_OTHER): Payer: 59 | Admitting: Internal Medicine

## 2013-11-21 VITALS — BP 122/74 | HR 72 | Ht 69.0 in | Wt 174.0 lb

## 2013-11-21 DIAGNOSIS — J309 Allergic rhinitis, unspecified: Secondary | ICD-10-CM

## 2013-11-21 DIAGNOSIS — J302 Other seasonal allergic rhinitis: Secondary | ICD-10-CM

## 2013-11-21 DIAGNOSIS — Z23 Encounter for immunization: Secondary | ICD-10-CM

## 2013-11-21 DIAGNOSIS — J449 Chronic obstructive pulmonary disease, unspecified: Secondary | ICD-10-CM

## 2013-11-21 DIAGNOSIS — J3089 Other allergic rhinitis: Secondary | ICD-10-CM

## 2013-11-21 MED ORDER — PREDNISONE 10 MG PO TABS: ORAL_TABLET | ORAL | Status: DC

## 2013-11-21 NOTE — Patient Instructions (Signed)
We can continue present meds  Flu vax 

## 2013-11-21 NOTE — Progress Notes (Signed)
Patient ID: AUM CAGGIANO, male    DOB: [birth year], 58 y.o.   MRN: [id number]  HPI 4/ 3/12- 44 yo never smoker with hx chronic obstructive asthma, now on Xolair. Xolair is being dosed 150 mg per month, but he feels it wearing off by 3 weeks and asks about a dosing change. He remains on prednisone 5 mg daily. At 3 weeks after a xolair dose he begins to feel tighter, more wheeze. He had to miss work today because of asthma and he increased prednisone today to 20 mg. . He denies obvious change with seasonal pollens- no itch, sneeze,watery eyes.   10/24/10- 75 yo never smoker with hx chronic obstructive asthma, now on Xolair. Has continued Xoalir monthly. One injection/ month lasted few weeks-not a month. Increased to 2 injections/ month, same total dose,  and not as clearly helping. Has continued chronic maintenance prednisone. We discussed allergic vs nonallergic triggers. He is watching what happens as we enter Fall season. He had to miss work due to asthma flare 2 days ago and was aggresively using neb. Trigger was lawnmower smoke in garage.  Today is great. Currently on prednisone 20 mg daily. He remembers bone density check by Dr Lavone Orn within last few years.  We discussed Daliresp and he is interested in giving it another try.   04/18/11- 27 yo never smoker with hx chronic obstructive asthma, now on Xolair. He has been trying to get to lower maintenance prednisone dose and asked for higher Xolair dose. We increased him 300-> Xolair 375 mg  monthly.    Wife here  FOLLOWS FOR: been out of work for 3 days as of today-doesnt want to go up past prednisone 15mg  (was on 30mg ),using Zyflo. Wheezing increased-getting up in middle of night to do breathing tx's.   He was on prednisone 40 mg daily for several weeks before trying to drop down. Using nebulizer at night. Concerned about increased body fat from prednisone. We have had many long conversations about steroid side effects and alternatives over  the years. Theophylline did work when he was in college. Office spirometry: Severe obstructive airways disease. FEV1 1.29/35%, FEV1/FEC 0.46.  06/23/11- 56 yo never smoker with hx chronic obstructive asthma, now on Xolair. We had referred him to do, with the expectation he might be considered for thermal bronchoscopy to treat his asthma. They're treating him for reflux with Prilosec twice daily. He feels a Xolair 375 mg is helping after 2 doses, the way the first few doses did at 275 mg. He was just able to reduce his prednisone to 5 mg daily with mild residual wheeze. We reviewed findings associated with steroid withdrawal and adrenal insufficiency and discussed endocrinology referral to help with this issue.  08/20/11- 53 yo never smoker with hx chronic obstructive asthma, now on Xolair. Pt c/o increased SOB, chest tightness and prod cough x 1 week  Pt tx with Pred 30 mg x1 week then increased to 50mg  qd x 2days. Pt denies f/c/s.  Pt states even with the prednisone he cannot get relief. He has been on 30 mg of prednisone for most of this spring on a daily basis, usually with some wheeze but able to work. In the last day or 2 chest tightness and wheeze have been worse without specific reason. He had to increase prednisone to 50 mg daily. He continues Xolair injections. No recent colds or obvious exposures. Scant phlegm with trace yellow. He denies sinus problems. Taking Prilosec twice daily as  directed by the pulmonologist we sent him to at [facility]. We sent him specifically for evaluation of thermoplasty. He apparently is a little outside of their selection criteria but they're considering him. He has missed work intermittently. There is no part-time work available but he feels unable to do a 40 hour work week anymore  09/17/11- 82 yo never smoker with hx chronic obstructive asthma, now on Xolair.  Pt states he has notices a lot of improvement while on the Pred taper (now on 10mg ). Pt c/o slight wheezing and  sob, but not too concerned. Pt denies chest tightness, prod cough.  Discussed his work with the pulmonologist at [facility]. They emphasize management of his GERD, demonstrated with barium swallow. They plan PFTs at his next visit so we won't do them here. He has been out of work now for 3 months. 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 He continues Brovana and Atrovent nebulizers twice daily with rare need now for rescue inhaler. He did like Arcapta and we compared that to his nebulizer. He failed Singulair and Zyflo.  11/07/11- 61 yo never smoker with hx chronic obstructive asthma, ended Xolair June, 2013.    Wife here. Still having increased SOB-currently on 8mg  Prednisone-stilll out of work. Wheezing as well and cough. Recent exacer- improved after doxy. Has been able to maintain on prednisone 8 mg daily but no lower. [facility] said not good candidate for thermoplasty. Short term disability through Sept 11.  Long discussion of options, meds. Will try Brovana neb in AM/ Arcapta in PM.   12/10/11- 61 yo never smoker with hx chronic obstructive asthma, ended Xolair June, 2013.    Wife here. Still having SOB and wheezing at times; still out of work. Can tolerate light to moderate exertion on some days, but on other days wheezing dyspnea is too limiting. He is pleased to be would remain on only 8 mg daily prednisone now for 2 months at that dose. We have carefully discussed long-term steroid side effects. He asks extension of leave of absence one more month and has applied for total and permanent disability. Now out of work x3 months. No longer being followed at [facility] pulmonary. COPD assessment test (CAT) score 22/40.  01/07/12- 47 yo never smoker with hx chronic obstructive asthma, ended Xolair June, 2013.  Breathing no better but no worse than last time He considers that a good thing that he has been able to hold prednisone maintenance at 8 milligrams  daily. Exertional dyspnea easy fatigue little cough no chest pain or fever. He talks about the possibility of going back to work but it doesn't seem realistic. We agreed to extend his short term disability. CXR 12/30/11-    IMPRESSION:  1. Stable cardiomegaly with now mild pulmonary vascular  congestion.  2. Stable scarring at the lung apices bilaterally.  Original Report Authenticated By: Resa Miner. MATTERN, M.D.   04/08/12- 52 yo never smoker with hx chronic obstructive asthma, ended Xolair June, 2013.  FOLLOWS FOR: wheezing; has gotten worse in past 2-3 weeks even after albuterol and atrovent this morning. Acute exacerbation x 2-3 weeks-? Weather, but has not had a cold. Using all meds, incl used neb/ Duoneb 2 hours before arival this AM.  Remains out of work appropriately since June, 2013. Chronic asthma with COPD, steroid dependent trying not to go over 8 mg prednisone daily. Baseline daily wheeze and dyspnea, with occasional exacerbation. We have been able to keep him out  of hospital because he is good at self-management.  Failed Daliresp and Xolair. Several second-opinion visits to [facility] in 2013. They did not feel he was thermoplasty candidate.  Office spirometry 04/08/12- severe obstructive airways disease- FVC 2.64/58%, FEV1 1.30/36%, FEV1/FVC 0.49, FEF 25-75% 0.50/14%.  06/17/12- 41 yo never smoker with hx chronic obstructive asthma, ended Xolair June, 2013. ACUTE VISIT: chest tightness-feels more tightness in left side of chest, increased wheezing and SOB as well. Coughing-forces cough to bring mucus up. Worse asthma x2 weeks without fever, sore throat, itching or sneezing area and started herself back on Singulair. He has been on 8 mg of Medrol daily which is as low as he has been able to get in a long time. Using his nebulizer but seemed bring up scant clear mucus. He has not been taking Proventil tablets and has not tried the sample of Breo Ellipta.  11/03/12- 29 yo never  smoker with hx chronic obstructive asthma, ended Xolair June, 2013. GERD FOLLOWS FOR: breathing not the best; SOB and wheezing, chest congestion, cough -tightness in chest at times(able to get phlegm up after breathing tx's sometimes) Daily wheeze, cough, short of breath at baseline. Nebulizer produces clear sputum. Now has disability. Continues prednisone 10 mg daily. Occasionally aware of throat burning reflux despite Prilosec twice a day. CXR 06/17/12 IMPRESSION:  No acute finding.  Original Report Authenticated By: Orlean Patten, M.D.  03/16/13- 28 yo never smoker with hx chronic obstructive asthma, ended Xolair June, 2013. GERD FOLLOWS NKN:LZJQB like he is on the "fence" with his breathing being on Pred 10 mg and Dulera daily; unsure if he needs a shot of depo; can hear him wheezing(he states this happens all day long) Levaquin worked well. Now back on prednisone maintenance 10 mg daily plus Dulera but wheezing every day.h eartburn controlled. CXR 06/17/12 IMPRESSION:  No acute finding.  Original Report Authenticated By: Orlean Patten, M.D.  07/21/13- 38 yo never smoker with hx chronic obstructive asthma, ended Xolair June, 2013. GERD FOLLOWS FOR: P states his breathing is okay considering the weather and pollen. Head congestion, sinus pressure. He coughs some every day and uses nebulizer and rescue inhaler a couple of times per day to say and control. He expects seasonal flare with ragweed in the fall.  914/15- 25 yo never smoker with hx chronic obstructive asthma, ended Xolair June, 2013. GERD FOLLOWS FOR: occasional nonprod cough with clear mucus, SOB with exertion.  States he is feeling well.  On prednisone 10 mg daily for the last month, which he considers good. Home oximetry running around 92% on room air. Ruthe Mannan seems to work better as a maintenance inhaler. Admits occasional depression despite Lexapro, which he will discuss with his primary physician.he  Review of Systems-See  HPI Constitutional:   No weight loss, night sweats,  Fevers, chills, fatigue, lassitude. HEENT:   No headaches,  Difficulty swallowing,  Tooth/dental problems,  Sore throat,                No sneezing, itching, ear ache, No-nasal congestion, post nasal drip,  CV:  No chest pain, orthopnea, PND, swelling in lower extremities, anasarca, dizziness, palpitations GI  +heartburn,+indigestion, no-abdominal pain, nausea, vomiting, Resp:   No excess mucus, no-productive cough,  + non-productive cough,  No coughing up of blood.  No change in color of mucus.  + wheezing. Short of breath mainly w/ exertion.  Skin: no rash or lesions. GU:  MS:  No joint pain or swelling. Marland Kitchen Psych:  No  change in mood or affect. No depression or anxiety.  No memory loss.  Objective:   Physical Exam  General- Alert, Oriented, Affect-appropriate, Distress- none acute,  Looks well Skin- rash-none, lesions- none, excoriation- none, +tanned Lymphadenopathy- none Head- atraumatic            Eyes- Gross vision intact, PERRLA, conjunctivae- clear secretions            Ears- Hearing, canals normal            Nose- +thick nasal mucus, no-Septal dev,  polyps, erosion, perforation             Throat- Mallampati II , mucosa clear , drainage- none, tonsils- atrophic.  Neck- flexible , trachea midline, no stridor , thyroid nl, carotid no bruit Chest - symmetrical excursion , unlabored           Heart/CV- RRR , no murmur , no gallop  , no rub, nl s1 s2                           - JVD- none , edema- none, stasis changes- none, varices- none           Lung-  + bilateral wheeze, unlabored , able to speak full sentences.  Cough+ ,                                             dullness-none, rub- none.            Chest wall-  Abd- Br/ Gen/ Rectal- Not done, not indicated Extrem- cyanosis- none, clubbing, none, atrophy- none, strength- nl Neuro- grossly intact to observation

## 2013-12-24 NOTE — Assessment & Plan Note (Signed)
Controlled without current acute exacerbation of steroid-dependent. Plan-we discussed research protocols that might be available

## 2013-12-24 NOTE — Assessment & Plan Note (Signed)
Adequate control. Discussed available antihistamines. Flu vaccine

## 2014-01-03 ENCOUNTER — Other Ambulatory Visit: Payer: Self-pay | Admitting: Internal Medicine

## 2014-01-06 ENCOUNTER — Other Ambulatory Visit: Payer: Self-pay | Admitting: Internal Medicine

## 2014-01-13 ENCOUNTER — Other Ambulatory Visit: Payer: Self-pay | Admitting: Internal Medicine

## 2014-01-23 ENCOUNTER — Telehealth: Payer: Self-pay | Admitting: Internal Medicine

## 2014-01-23 DIAGNOSIS — J449 Chronic obstructive pulmonary disease, unspecified: Secondary | ICD-10-CM

## 2014-01-23 NOTE — Telephone Encounter (Signed)
Called, spoke with pt's wife. Reports his neb machine broke this weekend and requesting new order.  Has used [facility] in the past. Order placed -- wife aware and voiced no further questions or concerns at this time.

## 2014-02-07 ENCOUNTER — Telehealth: Payer: Self-pay | Admitting: Internal Medicine

## 2014-02-07 NOTE — Telephone Encounter (Signed)
lmtcb for pt.   Will await call back from pt to verify if ok to speak to insurance company about pt's medical information.

## 2014-02-13 NOTE — Telephone Encounter (Signed)
LMTCBx2. Alessandra Sawdey, CMA  

## 2014-02-14 NOTE — Telephone Encounter (Signed)
CY, Please advise if any restrictions. Thanks.

## 2014-02-14 NOTE — Telephone Encounter (Signed)
LMTC x `1 for pt 

## 2014-02-14 NOTE — Telephone Encounter (Signed)
He is limited by his breathing and stamina. That means he isn't able to do sustained physical exertion or to tolerate outdoor weather, smoke etc. I don't have a way or experience to otherwise judge functionality.

## 2014-02-14 NOTE — Telephone Encounter (Signed)
[phone] returning a call

## 2014-02-15 NOTE — Telephone Encounter (Signed)
Eric [name] is calling back & wanting to make sure Eric [name] got his fax. Please call - [phone] ext [phone]. His fax # [phone].

## 2014-02-15 NOTE — Telephone Encounter (Signed)
Aaron Edelman called back questioning again what the pt's restrictions are. Informed Aaron Edelman to fax the requesting information form to [name]'s attention. Aaron Edelman verbalized understanding- triage fax. Informed Aaron Edelman of the recs per CY, stated below, and Aaron Edelman stated he will fax the form which is more specific in the pt's limitations that we can complete.   Will forward to [name] to look out for form.

## 2014-02-16 NOTE — Telephone Encounter (Signed)
Forms were filled out and signed by CY-faxed back yesterday after 5:00pm by Dorothyann Peng on my behalf. I have called and left message for Aaron Edelman to call back and confirm he received the fax. Forms are at my desk. Thanks.

## 2014-02-21 NOTE — Telephone Encounter (Signed)
lmomtcb for [employer]

## 2014-02-23 NOTE — Telephone Encounter (Signed)
lmomtcb for brian on VM

## 2014-02-24 NOTE — Telephone Encounter (Signed)
LMTCB and will close per triage protocol 

## 2014-02-27 ENCOUNTER — Telehealth: Payer: Self-pay | Admitting: Internal Medicine

## 2014-02-27 NOTE — Telephone Encounter (Signed)
lmomtcb x1 

## 2014-02-28 NOTE — Telephone Encounter (Signed)
lmomtcb x 2  

## 2014-03-01 MED ORDER — PREDNISONE 1 MG PO TABS: ORAL_TABLET | ORAL | Status: DC

## 2014-03-01 NOTE — Telephone Encounter (Signed)
Ok to try tapering prednisone as tolerated.  Try 9 mg daily (such as 1/2 x 10 mg, plus 4 one mg tabs) x 7 days       Pred 1 mg,  # 100 "as directed" Taper daily dose by 1 mg per day, each week. When get to 5 mg daily, stay on that for a month to see how stable this is. Call to report how he is doing after a month, before we try to go any lower.

## 2014-03-01 NOTE — Telephone Encounter (Signed)
Pt spouse is aware and rx sent. Mac[place]in Bing, CMA

## 2014-03-01 NOTE — Telephone Encounter (Signed)
I spoke with pt spouse and she states the pt was advised to taper his prednisone and she is requesting that an rx for prednisone 1mg  be called in. Please advise if ok to send in rx and what directions and quantity? Galatia Bing, CMA  Allergies  Allergen Reactions  . Erythromycin Ethylsuccinate     REACTION: unspecified  . Penicillins    Current Outpatient Prescriptions on File Prior to Visit  Medication Sig Dispense Refill  . albuterol (PROVENTIL) (2.5 MG/3ML) 0.083% nebulizer solution 1 neb every 4 hours as needed 360 mL 3  . albuterol (PROVENTIL) 4 MG tablet TAKE 1 TABLET BY MOUTH TWICE A DAY AS NEEDED 60 tablet 1  . albuterol (VENTOLIN HFA) 108 (90 BASE) MCG/ACT inhaler 2 puffs four times daily as needed 3 Inhaler 3  . Calcium Carbonate-Vitamin D (CALCIUM 600+D) 600-200 MG-UNIT TABS Take 1 tablet by mouth 2 (two) times daily.      . citalopram (CELEXA) 20 MG tablet Take 20 mg by mouth daily.    Marland Kitchen EPINEPHrine 0.3 mg/0.3 mL IJ SOAJ injection Inject 0.3 mLs (0.3 mg total) into the muscle once. 1 Device prn  . fluticasone (FLONASE) 50 MCG/ACT nasal spray 2 puffs every 4 hours if needed- rescue 48 g 3  . guaiFENesin (MUCINEX) 600 MG 12 hr tablet Take 600 mg by mouth 2 (two) times daily.      Marland Kitchen ipratropium (ATROVENT) 0.02 % nebulizer solution Take 2.5 mLs (500 mcg total) by nebulization 4 (four) times daily. 675 mL 3  . Magnesium Cl-Calcium Carbonate (SLOW-MAG PO) Take 2 tablets by mouth daily.    . mometasone-formoterol (DULERA) 200-5 MCG/ACT AERO Inhale 2 puffs into the lungs 2 (two) times daily. Rinse mouth 3 Inhaler 2  . montelukast (SINGULAIR) 10 MG tablet Take 1 tablet by mouth at  bedtime 90 tablet 1  . omeprazole (PRILOSEC) 20 MG capsule Take 1 capsule (20 mg total) by mouth 2 (two) times daily. 180 capsule 0  . predniSONE (DELTASONE) 10 MG tablet TAKE 4 DAILY OR AS DIRECTED 100 tablet 0   No current facility-administered medications on file prior to visit.

## 2014-03-06 ENCOUNTER — Telehealth: Payer: Self-pay | Admitting: Internal Medicine

## 2014-03-06 MED ORDER — DOXYCYCLINE HYCLATE 100 MG PO TABS: ORAL_TABLET | ORAL | Status: DC

## 2014-03-06 NOTE — Telephone Encounter (Signed)
Ok doxycycline 100 mg, # 8, 2 today then refill x 1, ref x 1

## 2014-03-06 NOTE — Telephone Encounter (Signed)
Called, spoke with pt.  Discussed below per Dr. Annamaria Boots.  He verbalized understanding and is aware do[facility] with [facility] ref.  He is aware to call office back if symptoms do not improve or worsen and is to seek emergency care if needed.

## 2014-03-06 NOTE — Telephone Encounter (Signed)
Spoke with pt, c/o prod cough with brown mucus X1 week accompanied by sore throat.  Denies fever, sinus congestion, chest tightness, increased sob.  Is requesting a [facility]cycline rx [facility] to [facility] on [address].    Last ov: 11/21/13 Next ov: 03/23/14  Dr. Annamaria Boots please advise.  Thank you!  Allergies  Allergen Reactions  . Erythromycin Ethylsuccinate     REACTION: unspecified  . Penicillins    Current Outpatient Prescriptions on File Prior to Visit  Medication Sig Dispense Refill  . albuterol (PROVENTIL) (2.5 MG/3ML) 0.083% nebulizer solution 1 neb every 4 hours as needed 360 mL 3  . albuterol (PROVENTIL) 4 MG tablet TAKE 1 TABLET BY MOUTH TWICE A DAY AS NEEDED 60 tablet 1  . albuterol (VENTOLIN HFA) 108 (90 BASE) MCG/ACT inhaler 2 puffs four times daily as needed 3 Inhaler 3  . Calcium Carbonate-Vitamin D (CALCIUM 600+D) 600-200 MG-UNIT TABS Take 1 tablet by mouth 2 (two) times daily.      . citalopram (CELEXA) 20 MG tablet Take 20 mg by mouth daily.    Marland Kitchen EPINEPHrine 0.3 mg/0.3 mL IJ SOAJ injection Inject 0.3 mLs (0.3 mg total) into the muscle once. 1 Device prn  . fluticasone (FLONASE) 50 MCG/ACT nasal spray 2 puffs every 4 hours if needed- rescue 48 g 3  . guaiFENesin (MUCINEX) 600 MG 12 hr tablet Take 600 mg by mouth 2 (two) times daily.      Marland Kitchen ipratropium (ATROVENT) 0.02 % nebulizer solution Take 2.5 mLs (500 mcg total) by nebulization 4 (four) times daily. 675 mL 3  . Magnesium Cl-Calcium Carbonate (SLOW-MAG PO) Take 2 tablets by mouth daily.    . mometasone-formoterol (DULERA) 200-5 MCG/ACT AERO Inhale 2 puffs into the lungs 2 (two) times daily. Rinse mouth 3 Inhaler 2  . montelukast (SINGULAIR) 10 MG tablet Take 1 tablet by mouth at  bedtime 90 tablet 1  . omeprazole (PRILOSEC) 20 MG capsule Take 1 capsule (20 mg total) by mouth 2 (two) times daily. 180 capsule 0  . predniSONE (DELTASONE) 1 MG tablet 1 tablet daily as directed. 100 tablet 0  . predniSONE (DELTASONE) 10 MG tablet TAKE 4  DAILY OR AS DIRECTED 100 tablet 0   No current facility-administered medications on file prior to visit.

## 2014-03-10 ENCOUNTER — Other Ambulatory Visit: Payer: Self-pay | Admitting: Internal Medicine

## 2014-03-23 ENCOUNTER — Ambulatory Visit (INDEPENDENT_AMBULATORY_CARE_PROVIDER_SITE_OTHER): Payer: 59 | Admitting: Internal Medicine

## 2014-03-23 ENCOUNTER — Encounter: Payer: Self-pay | Admitting: Internal Medicine

## 2014-03-23 VITALS — BP 118/76 | HR 78 | Ht 69.0 in | Wt 177.8 lb

## 2014-03-23 DIAGNOSIS — J449 Chronic obstructive pulmonary disease, unspecified: Secondary | ICD-10-CM

## 2014-03-23 MED ORDER — PREDNISONE (PAK) 5 MG PO TABS: ORAL_TABLET | ORAL | Status: DC

## 2014-03-23 MED ORDER — PREDNISONE 10 MG PO TABS: ORAL_TABLET | ORAL | Status: DC

## 2014-03-23 MED ORDER — DOXYCYCLINE HYCLATE 100 MG PO TABS: ORAL_TABLET | ORAL | Status: DC

## 2014-03-23 NOTE — Progress Notes (Signed)
Patient ID: Eric [name], male    DOB: Nov 09, 1955, 59 y.o.   MRN: [id number]  HPI 4/ 3/12- 61 yo never smoker with hx chronic obstructive asthma, now on Xolair. Xolair is being dosed 150 mg per month, but he feels it wearing off by 3 weeks and asks about a dosing change. He remains on prednisone 5 mg daily. At 3 weeks after a xolair dose he begins to feel tighter, more wheeze. He had to miss work today because of asthma and he increased prednisone today to 20 mg. . He denies obvious change with seasonal pollens- no itch, sneeze,watery eyes.   10/24/10- 65 yo never smoker with hx chronic obstructive asthma, now on Xolair. Has continued Xoalir monthly. One injection/ month lasted few weeks-not a month. Increased to 2 injections/ month, same total dose,  and not as clearly helping. Has continued chronic maintenance prednisone. We discussed allergic vs nonallergic triggers. He is watching what happens as we enter Fall season. He had to miss work due to asthma flare 2 days ago and was aggresively using neb. Trigger was lawnmower smoke in garage.  Today is great. Currently on prednisone 20 mg daily. He remembers bone density check by Dr Lavone Orn within last few years.  We discussed Daliresp and he is interested in giving it another try.   04/18/11- 8 yo never smoker with hx chronic obstructive asthma, now on Xolair. He has been trying to get to lower maintenance prednisone dose and asked for higher Xolair dose. We increased him 300-> Xolair 375 mg House monthly.    Wife here  FOLLOWS FOR: been out of work for 3 days as of today-doesnt want to go up past prednisone 15mg  (was on 30mg ),using Zyflo. Wheezing increased-getting up in middle of night to do breathing tx's.   He was on prednisone 40 mg daily for several weeks before trying to drop down. Using nebulizer at night. Concerned about increased body fat from prednisone. We have had many long conversations about steroid side effects and alternatives over  the years. Theophylline did work when he was in college. Office spirometry: Severe obstructive airways disease. FEV1 1.29/35%, FEV1/FEC 0.46.  06/23/11- 83 yo never smoker with hx chronic obstructive asthma, now on Xolair. We had referred him to do, with the expectation he might be considered for thermal bronchoscopy to treat his asthma. They're treating him for reflux with Prilosec twice daily. He feels a Xolair 375 mg is helping after 2 doses, the way the first few doses did at 275 mg. He was just able to reduce his prednisone to 5 mg daily with mild residual wheeze. We reviewed findings associated with steroid withdrawal and adrenal insufficiency and discussed endocrinology referral to help with this issue.  08/20/11- 89 yo never smoker with hx chronic obstructive asthma, now on Xolair. Pt c/o increased SOB, chest tightness and prod cough x 1 week  Pt tx with Pred 30 mg x1 week then increased to 50mg  qd x 2days. Pt denies f/c/s.  Pt states even with the prednisone he cannot get relief. He has been on 30 mg of prednisone for most of this spring on a daily basis, usually with some wheeze but able to work. In the last day or 2 chest tightness and wheeze have been worse without specific reason. He had to increase prednisone to 50 mg daily. He continues Xolair injections. No recent colds or obvious exposures. Scant phlegm with trace yellow. He denies sinus problems. Taking Prilosec twice daily as  directed by the pulmonologist we sent him to at [facility]. We sent him specifically for evaluation of thermoplasty. He apparently is a little outside of their selection criteria but they're considering him. He has missed work intermittently. There is no part-time work available but he feels unable to do a 40 hour work week anymore  09/17/11- 56 yo never smoker with hx chronic obstructive asthma, now on Xolair.  Pt states he has notices a lot of improvement while on the Pred taper (now on 10mg ). Pt c/o slight wheezing and  sob, but not too concerned. Pt denies chest tightness, prod cough.  Discussed his work with the pulmonologist at [facility]. They emphasize management of his GERD, demonstrated with barium swallow. They plan PFTs at his next visit so we won't do them here. He has been out of work now for 3 months. 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 He continues Brovana and Atrovent nebulizers twice daily with rare need now for rescue inhaler. He did like Arcapta and we compared that to his nebulizer. He failed Singulair and Zyflo.  11/07/11- 22 yo never smoker with hx chronic obstructive asthma, ended Xolair June, 2013.    Wife here. Still having increased SOB-currently on 8mg  Prednisone-stilll out of work. Wheezing as well and cough. Recent exacer- improved after doxy. Has been able to maintain on prednisone 8 mg daily but no lower. [facility] said not good candidate for thermoplasty. Short term disability through Sept 11.  Long discussion of options, meds. Will try Brovana neb in AM/ Arcapta in PM.   12/10/11- 9 yo never smoker with hx chronic obstructive asthma, ended Xolair June, 2013.    Wife here. Still having SOB and wheezing at times; still out of work. Can tolerate light to moderate exertion on some days, but on other days wheezing dyspnea is too limiting. He is pleased to be would remain on only 8 mg daily prednisone now for 2 months at that dose. We have carefully discussed long-term steroid side effects. He asks extension of leave of absence one more month and has applied for total and permanent disability. Now out of work x3 months. No longer being followed at [facility] pulmonary. COPD assessment test (CAT) score 22/40.  01/07/12- 61 yo never smoker with hx chronic obstructive asthma, ended Xolair June, 2013.  Breathing no better but no worse than last time He considers that a good thing that he has been able to hold prednisone maintenance at 8 milligrams  daily. Exertional dyspnea easy fatigue little cough no chest pain or fever. He talks about the possibility of going back to work but it doesn't seem realistic. We agreed to extend his short term disability. CXR 12/30/11-    IMPRESSION:  1. Stable cardiomegaly with now mild pulmonary vascular  congestion.  2. Stable scarring at the lung apices bilaterally.  Original Report Authenticated By: Resa Miner. MATTERN, M.D.   04/08/12- 46 yo never smoker with hx chronic obstructive asthma, ended Xolair June, 2013.  FOLLOWS FOR: wheezing; has gotten worse in past 2-3 weeks even after albuterol and atrovent this morning. Acute exacerbation x 2-3 weeks-? Weather, but has not had a cold. Using all meds, incl used neb/ Duoneb 2 hours before arival this AM.  Remains out of work appropriately since June, 2013. Chronic asthma with COPD, steroid dependent trying not to go over 8 mg prednisone daily. Baseline daily wheeze and dyspnea, with occasional exacerbation. We have been able to keep him out  of hospital because he is good at self-management.  Failed Daliresp and Xolair. Several second-opinion visits to [facility] in 2013. They did not feel he was thermoplasty candidate.  Office spirometry 04/08/12- severe obstructive airways disease- FVC 2.64/58%, FEV1 1.30/36%, FEV1/FVC 0.49, FEF 25-75% 0.50/14%.  06/17/12- 60 yo never smoker with hx chronic obstructive asthma, ended Xolair June, 2013. ACUTE VISIT: chest tightness-feels more tightness in left side of chest, increased wheezing and SOB as well. Coughing-forces cough to bring mucus up. Worse asthma x2 weeks without fever, sore throat, itching or sneezing area and started herself back on Singulair. He has been on 8 mg of Medrol daily which is as low as he has been able to get in a long time. Using his nebulizer but seemed bring up scant clear mucus. He has not been taking Proventil tablets and has not tried the sample of Breo Ellipta.  11/03/12- 60 yo never  smoker with hx chronic obstructive asthma, ended Xolair June, 2013. GERD FOLLOWS FOR: breathing not the best; SOB and wheezing, chest congestion, cough -tightness in chest at times(able to get phlegm up after breathing tx's sometimes) Daily wheeze, cough, short of breath at baseline. Nebulizer produces clear sputum. Now has disability. Continues prednisone 10 mg daily. Occasionally aware of throat burning reflux despite Prilosec twice a day. CXR 06/17/12 IMPRESSION:  No acute finding.  Original Report Authenticated By: Orlean Patten, M.D.  03/16/13- 89 yo never smoker with hx chronic obstructive asthma, ended Xolair June, 2013. GERD FOLLOWS NGE:XBMWU like he is on the "fence" with his breathing being on Pred 10 mg and Dulera daily; unsure if he needs a shot of depo; can hear him wheezing(he states this happens all day long) Levaquin worked well. Now back on prednisone maintenance 10 mg daily plus Dulera but wheezing every day.h eartburn controlled. CXR 06/17/12 IMPRESSION:  No acute finding.  Original Report Authenticated By: Orlean Patten, M.D.  07/21/13- 7 yo never smoker with hx chronic obstructive asthma, ended Xolair June, 2013. GERD FOLLOWS FOR: P states his breathing is okay considering the weather and pollen. Head congestion, sinus pressure. He coughs some every day and uses nebulizer and rescue inhaler a couple of times per day to say and control. He expects seasonal flare with ragweed in the fall.  914/15- 46 yo never smoker with hx chronic obstructive asthma, ended Xolair June, 2013. GERD FOLLOWS FOR: occasional nonprod cough with clear mucus, SOB with exertion.  States he is feeling well.  On prednisone 10 mg daily for the last month, which he considers good. Home oximetry running around 92% on room air. Ruthe Mannan seems to work better as a maintenance inhaler. Admits occasional depression despite Lexapro, which he will discuss with his primary physician.  03/23/14-  49 yo never  smoker with hx chronic obstructive asthma, ended Xolair June, 2013. GERD FOLLOWS FOR:had flare up of Asthma late December and was given Doxycycline Rx with refill-had to take additional refill due to symptoms coming back-currently on it. Symptoms have stopped again but unsure if will come back. Has 2 more days of the current doxycycline prescription. Now taking prednisone 20 mg daily with goal of getting back to 10 mg daily. Wife and daughter are both sick now.  Review of Systems-See HPI Constitutional:   No weight loss, night sweats,  Fevers, chills, fatigue, lassitude. HEENT:   No headaches,  Difficulty swallowing,  Tooth/dental problems,  Sore throat,                No  sneezing, itching, ear ache, No-nasal congestion, post nasal drip,  CV:  No chest pain, orthopnea, PND, swelling in lower extremities, anasarca, dizziness, palpitations GI  +heartburn,+indigestion, no-abdominal pain, nausea, vomiting, Resp:   No excess mucus, no-productive cough,  + non-productive cough,  No coughing up of blood.  No change in color of mucus.  + wheezing. Short of breath mainly w/ exertion.  Skin: no rash or lesions. GU:  MS:  No joint pain or swelling. Marland Kitchen Psych:  No change in mood or affect. No depression or anxiety.  No memory loss.  Objective:   Physical Exam  General- Alert, Oriented, Affect-appropriate, Distress- none acute,  Looks well Skin- rash-none, lesions- none, excoriation- none, +tanned Lymphadenopathy- none Head- atraumatic            Eyes- Gross vision intact, PERRLA, conjunctivae- clear secretions            Ears- Hearing, canals normal            Nose- + sniffing, no-Septal dev,  polyps, erosion, perforation             Throat- Mallampati II , mucosa clear , drainage- none, tonsils- atrophic.  Neck- flexible , trachea midline, no stridor , thyroid nl, carotid no bruit Chest - symmetrical excursion , unlabored           Heart/CV- RRR , no murmur , no gallop  , no rub, nl s1 s2                            - JVD- none , edema- none, stasis changes- none, varices- none           Lung-    Wheeze+ trace, unlabored , able to speak full sentences.  Cough+ ,                                             dullness-none, rub- none.            Chest wall-  Abd- Br/ Gen/ Rectal- Not done, not indicated Extrem- cyanosis- none, clubbing, none, atrophy- none, strength- nl Neuro- grossly intact to observation

## 2014-03-23 NOTE — Patient Instructions (Signed)
Script to hold for doxycycline refills   We may want to consider a trial of of Zithromax in the future as an option.  Taper the prednisone as discussed when you feel able

## 2014-03-26 NOTE — Assessment & Plan Note (Signed)
Current illness consistent with an upper respiratory infection progressing to tracheobronchitis and exacerbation of his chronic obstructive asthma Plan-standby prescription for doxycycline to hold. Consider switching to Z-Pak next time. Medications were discussed in detail.

## 2014-03-26 NOTE — Assessment & Plan Note (Signed)
Severe chronic fixed obstructive disease attributed to asthma in this nonsmoker with no history of significant respiratory infections or exposures. Alpha-1 evaluation normal. Steroid dependent. Plan-taper prednisone back to 10 mg daily as tolerated.

## 2014-04-18 ENCOUNTER — Ambulatory Visit: Payer: 59 | Admitting: Pulmonary Disease

## 2014-06-19 ENCOUNTER — Other Ambulatory Visit: Payer: Self-pay | Admitting: Internal Medicine

## 2014-06-22 ENCOUNTER — Telehealth: Payer: Self-pay | Admitting: Internal Medicine

## 2014-06-22 NOTE — Telephone Encounter (Signed)
Spoke with pt, requesting CY's opinion on taking a DHEA supplement.  States that he's read that it can help with renal insufficiencies, but is wanting a doctor's opinion on it.  CY please advise if you have any recommendations on this.  Thanks!  Allergies  Allergen Reactions  . Erythromycin Ethylsuccinate     REACTION: unspecified  . Penicillins    Current Outpatient Prescriptions on File Prior to Visit  Medication Sig Dispense Refill  . albuterol (PROVENTIL) (2.5 MG/3ML) 0.083% nebulizer solution Use 1 vial via nebulizer  every 4 hours as needed 1500 mL 0  . albuterol (VENTOLIN HFA) 108 (90 BASE) MCG/ACT inhaler 2 puffs four times daily as needed 3 Inhaler 3  . Calcium Carbonate-Vitamin D (CALCIUM 600+D) 600-200 MG-UNIT TABS Take 1 tablet by mouth 2 (two) times daily.      . citalopram (CELEXA) 20 MG tablet Take 20 mg by mouth daily.    Marland Kitchen doxycycline (VIBRA-TABS) 100 MG tablet Take 2 today then one daily until gone 8 tablet 5  . EPINEPHrine 0.3 mg/0.3 mL IJ SOAJ injection Inject 0.3 mLs (0.3 mg total) into the muscle once. (Patient not taking: Reported on 03/23/2014) 1 Device prn  . fluticasone (FLONASE) 50 MCG/ACT nasal spray 2 puffs every 4 hours if needed- rescue 48 g 3  . guaiFENesin (MUCINEX) 600 MG 12 hr tablet Take 600 mg by mouth 2 (two) times daily.      Marland Kitchen ipratropium (ATROVENT) 0.02 % nebulizer solution Take 2.5 mLs (500 mcg total) by nebulization 4 (four) times daily. 675 mL 3  . Magnesium Cl-Calcium Carbonate (SLOW-MAG PO) Take 2 tablets by mouth daily.    . mometasone-formoterol (DULERA) 200-5 MCG/ACT AERO Inhale 2 puffs into the lungs 2 (two) times daily. Rinse mouth 3 Inhaler 2  . montelukast (SINGULAIR) 10 MG tablet Take 1 tablet by mouth at  bedtime (Patient not taking: Reported on 03/23/2014) 90 tablet 1  . omeprazole (PRILOSEC) 20 MG capsule Take 1 capsule (20 mg total) by mouth 2 (two) times daily. 180 capsule 0  . predniSONE (DELTASONE) 1 MG tablet 1 tablet daily as  directed. 100 tablet 0  . predniSONE (DELTASONE) 10 MG tablet TAKE 4 TABLETS BY MOUTH EVERY DAY OR AS DIRECTED 100 tablet 0   No current facility-administered medications on file prior to visit.

## 2014-06-22 NOTE — Telephone Encounter (Signed)
All I could find was an article that said it made kidney function worse in diabetic rats. I wouldn't recommend using it.

## 2014-06-22 NOTE — Telephone Encounter (Signed)
Called and spoke to pt. Informed pt of the recs per CY. Pt verbalized understanding and denied any further questions or concerns at this time.

## 2014-08-23 ENCOUNTER — Other Ambulatory Visit: Payer: Self-pay | Admitting: Internal Medicine

## 2014-09-01 ENCOUNTER — Telehealth: Payer: Self-pay | Admitting: Internal Medicine

## 2014-09-01 MED ORDER — DOXYCYCLINE HYCLATE 100 MG PO TABS: 100.0000 mg | ORAL_TABLET | Freq: Two times a day (BID) | ORAL | Status: DC

## 2014-09-01 NOTE — Telephone Encounter (Signed)
Patient says that for the past several weeks, he has been having problems getting his asthma in control.  Patient has been using his neb and atrovent, not seeing any difference.  Non-productive cough, sinus pressure, sinus drainage, chest tightness, chest discomfort.  No fever.  Wheezing.  Taking 40mg  Prednisone daily x 1 week.   Allergies  Allergen Reactions  . Erythromycin Ethylsuccinate     REACTION: unspecified  . Penicillins    Current Outpatient Prescriptions on File Prior to Visit  Medication Sig Dispense Refill  . albuterol (PROVENTIL) (2.5 MG/3ML) 0.083% nebulizer solution Use 1 vial via nebulizer  every 4 hours as needed 1500 mL 0  . albuterol (VENTOLIN HFA) 108 (90 BASE) MCG/ACT inhaler 2 puffs four times daily as needed 3 Inhaler 3  . Calcium Carbonate-Vitamin D (CALCIUM 600+D) 600-200 MG-UNIT TABS Take 1 tablet by mouth 2 (two) times daily.      . citalopram (CELEXA) 20 MG tablet Take 20 mg by mouth daily.    Marland Kitchen doxycycline (VIBRA-TABS) 100 MG tablet Take 2 today then one daily until gone 8 tablet 5  . EPINEPHrine 0.3 mg/0.3 mL IJ SOAJ injection Inject 0.3 mLs (0.3 mg total) into the muscle once. (Patient not taking: Reported on 03/23/2014) 1 Device prn  . fluticasone (FLONASE) 50 MCG/ACT nasal spray 2 puffs every 4 hours if needed- rescue 48 g 3  . guaiFENesin (MUCINEX) 600 MG 12 hr tablet Take 600 mg by mouth 2 (two) times daily.      Marland Kitchen ipratropium (ATROVENT) 0.02 % nebulizer solution Take 2.5 mLs (500 mcg total) by nebulization 4 (four) times daily. 675 mL 3  . Magnesium Cl-Calcium Carbonate (SLOW-MAG PO) Take 2 tablets by mouth daily.    . mometasone-formoterol (DULERA) 200-5 MCG/ACT AERO Inhale 2 puffs into the lungs 2 (two) times daily. Rinse mouth 3 Inhaler 2  . montelukast (SINGULAIR) 10 MG tablet Take 1 tablet by mouth at  bedtime (Patient not taking: Reported on 03/23/2014) 90 tablet 1  . omeprazole (PRILOSEC) 20 MG capsule Take 1 capsule (20 mg total) by mouth 2 (two) times  daily. 180 capsule 0  . predniSONE (DELTASONE) 1 MG tablet 1 tablet daily as directed. 100 tablet 0  . predniSONE (DELTASONE) 10 MG tablet TAKE 4 TABLETS BY MOUTH EVERY DAY OR AS DIRECTED 100 tablet 0   No current facility-administered medications on file prior to visit.

## 2014-09-01 NOTE — Telephone Encounter (Signed)
If there is any possibility of infection would Rx doxycycline 100 mg, # 14, 1 twice daily Can try increasing prednisone to 60 mg daily Does he think of anything else he would like to try?

## 2014-09-01 NOTE — Telephone Encounter (Signed)
Pt aware of recs.  Med sent in.  Nothing further needed.

## 2014-09-12 ENCOUNTER — Other Ambulatory Visit: Payer: Self-pay | Admitting: Internal Medicine

## 2014-09-21 ENCOUNTER — Ambulatory Visit: Payer: 59 | Admitting: Internal Medicine

## 2014-09-27 ENCOUNTER — Other Ambulatory Visit: Payer: Self-pay | Admitting: Internal Medicine

## 2014-09-27 ENCOUNTER — Encounter: Payer: Self-pay | Admitting: Internal Medicine

## 2014-09-27 ENCOUNTER — Other Ambulatory Visit (INDEPENDENT_AMBULATORY_CARE_PROVIDER_SITE_OTHER): Payer: 59

## 2014-09-27 ENCOUNTER — Ambulatory Visit: Payer: 59 | Admitting: Internal Medicine

## 2014-09-27 VITALS — BP 114/60 | HR 77 | Ht 68.0 in | Wt 176.4 lb

## 2014-09-27 DIAGNOSIS — J449 Chronic obstructive pulmonary disease, unspecified: Secondary | ICD-10-CM

## 2014-09-27 DIAGNOSIS — J4489 Other specified chronic obstructive pulmonary disease: Secondary | ICD-10-CM

## 2014-09-27 LAB — CBC WITH DIFFERENTIAL/PLATELET
BASOS ABS: 0.1 10*3/uL (ref 0.0–0.1)
Basophils Relative: 0.5 % (ref 0.0–3.0)
EOS ABS: 0.2 10*3/uL (ref 0.0–0.7)
Eosinophils Relative: 2.1 % (ref 0.0–5.0)
HCT: 46.4 % (ref 39.0–52.0)
Hemoglobin: 15.7 g/dL (ref 13.0–17.0)
LYMPHS ABS: 1 10*3/uL (ref 0.7–4.0)
LYMPHS PCT: 8.7 % — AB (ref 12.0–46.0)
MCHC: 33.9 g/dL (ref 30.0–36.0)
MCV: 92.5 fl (ref 78.0–100.0)
MONO ABS: 0.4 10*3/uL (ref 0.1–1.0)
MONOS PCT: 3.9 % (ref 3.0–12.0)
Neutro Abs: 9.6 10*3/uL — ABNORMAL HIGH (ref 1.4–7.7)
Neutrophils Relative %: 84.8 % — ABNORMAL HIGH (ref 43.0–77.0)
Platelets: 229 10*3/uL (ref 150.0–400.0)
RBC: 5.02 Mil/uL (ref 4.22–5.81)
RDW: 13.6 % (ref 11.5–15.5)
WBC: 11.3 10*3/uL — AB (ref 4.0–10.5)

## 2014-09-27 MED ORDER — PREDNISONE 1 MG PO TABS: ORAL_TABLET | ORAL | Status: DC

## 2014-09-27 NOTE — Patient Instructions (Signed)
We are considering a trial of Nucala injection therapy if your eosinophils are high enough to qualify.  Order- lab  CBC w diff     Dx chronic obstructive asthma   Finish the prednisone taper back to your baseline as before  We will wait on more antibiotics for now

## 2014-09-27 NOTE — Progress Notes (Signed)
Patient ID: Eric [name], male    DOB: [birth year], 57 y.o.   MRN: [id number]  HPI 4/ 3/12- 44 yo never smoker with hx chronic obstructive asthma, now on Xolair. Xolair is being dosed 150 mg per month, but he feels it wearing off by 3 weeks and asks about a dosing change. He remains on prednisone 5 mg daily. At 3 weeks after a xolair dose he begins to feel tighter, more wheeze. He had to miss work today because of asthma and he increased prednisone today to 20 mg. . He denies obvious change with seasonal pollens- no itch, sneeze,watery eyes.   10/24/10- 75 yo never smoker with hx chronic obstructive asthma, now on Xolair. Has continued Xoalir monthly. One injection/ month lasted few weeks-not a month. Increased to 2 injections/ month, same total dose,  and not as clearly helping. Has continued chronic maintenance prednisone. We discussed allergic vs nonallergic triggers. He is watching what happens as we enter Fall season. He had to miss work due to asthma flare 2 days ago and was aggresively using neb. Trigger was lawnmower smoke in garage.  Today is great. Currently on prednisone 20 mg daily. He remembers bone density check by Dr Lavone Orn within last few years.  We discussed Daliresp and he is interested in giving it another try.   04/18/11- 27 yo never smoker with hx chronic obstructive asthma, now on Xolair. He has been trying to get to lower maintenance prednisone dose and asked for higher Xolair dose. We increased him 300-> Xolair 375 mg  monthly.    Wife here  FOLLOWS FOR: been out of work for 3 days as of today-doesnt want to go up past prednisone 15mg  (was on 30mg ),using Zyflo. Wheezing increased-getting up in middle of night to do breathing tx's.   He was on prednisone 40 mg daily for several weeks before trying to drop down. Using nebulizer at night. Concerned about increased body fat from prednisone. We have had many long conversations about steroid side effects and alternatives over  the years. Theophylline did work when he was in college. Office spirometry: Severe obstructive airways disease. FEV1 1.29/35%, FEV1/FEC 0.46.  06/23/11- 56 yo never smoker with hx chronic obstructive asthma, now on Xolair. We had referred him to do, with the expectation he might be considered for thermal bronchoscopy to treat his asthma. They're treating him for reflux with Prilosec twice daily. He feels a Xolair 375 mg is helping after 2 doses, the way the first few doses did at 275 mg. He was just able to reduce his prednisone to 5 mg daily with mild residual wheeze. We reviewed findings associated with steroid withdrawal and adrenal insufficiency and discussed endocrinology referral to help with this issue.  08/20/11- 53 yo never smoker with hx chronic obstructive asthma, now on Xolair. Pt c/o increased SOB, chest tightness and prod cough x 1 week  Pt tx with Pred 30 mg x1 week then increased to 50mg  qd x 2days. Pt denies f/c/s.  Pt states even with the prednisone he cannot get relief. He has been on 30 mg of prednisone for most of this spring on a daily basis, usually with some wheeze but able to work. In the last day or 2 chest tightness and wheeze have been worse without specific reason. He had to increase prednisone to 50 mg daily. He continues Xolair injections. No recent colds or obvious exposures. Scant phlegm with trace yellow. He denies sinus problems. Taking Prilosec twice daily as  directed by the pulmonologist we sent him to at [facility]. We sent him specifically for evaluation of thermoplasty. He apparently is a little outside of their selection criteria but they're considering him. He has missed work intermittently. There is no part-time work available but he feels unable to do a 40 hour work week anymore  09/17/11- 82 yo never smoker with hx chronic obstructive asthma, now on Xolair.  Pt states he has notices a lot of improvement while on the Pred taper (now on 10mg ). Pt c/o slight wheezing and  sob, but not too concerned. Pt denies chest tightness, prod cough.  Discussed his work with the pulmonologist at [facility]. They emphasize management of his GERD, demonstrated with barium swallow. They plan PFTs at his next visit so we won't do them here. He has been out of work now for 3 months. 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 He continues Brovana and Atrovent nebulizers twice daily with rare need now for rescue inhaler. He did like Arcapta and we compared that to his nebulizer. He failed Singulair and Zyflo.  11/07/11- 61 yo never smoker with hx chronic obstructive asthma, ended Xolair June, 2013.    Wife here. Still having increased SOB-currently on 8mg  Prednisone-stilll out of work. Wheezing as well and cough. Recent exacer- improved after doxy. Has been able to maintain on prednisone 8 mg daily but no lower. [facility] said not good candidate for thermoplasty. Short term disability through Sept 11.  Long discussion of options, meds. Will try Brovana neb in AM/ Arcapta in PM.   12/10/11- 61 yo never smoker with hx chronic obstructive asthma, ended Xolair June, 2013.    Wife here. Still having SOB and wheezing at times; still out of work. Can tolerate light to moderate exertion on some days, but on other days wheezing dyspnea is too limiting. He is pleased to be would remain on only 8 mg daily prednisone now for 2 months at that dose. We have carefully discussed long-term steroid side effects. He asks extension of leave of absence one more month and has applied for total and permanent disability. Now out of work x3 months. No longer being followed at [facility]. COPD assessment test (CAT) score 22/40.  01/07/12- 47 yo never smoker with hx chronic obstructive asthma, ended Xolair June, 2013.  Breathing no better but no worse than last time He considers that a good thing that he has been able to hold prednisone maintenance at 8 milligrams  daily. Exertional dyspnea easy fatigue little cough no chest pain or fever. He talks about the possibility of going back to work but it doesn't seem realistic. We agreed to extend his short term disability. CXR 12/30/11-    IMPRESSION:  1. Stable cardiomegaly with now mild pulmonary vascular  congestion.  2. Stable scarring at the lung apices bilaterally.  Original Report Authenticated By: Resa Miner. MATTERN, M.D.   04/08/12- 52 yo never smoker with hx chronic obstructive asthma, ended Xolair June, 2013.  FOLLOWS FOR: wheezing; has gotten worse in past 2-3 weeks even after albuterol and atrovent this morning. Acute exacerbation x 2-3 weeks-? Weather, but has not had a cold. Using all meds, incl used neb/ Duoneb 2 hours before arival this AM.  Remains out of work appropriately since June, 2013. Chronic asthma with COPD, steroid dependent trying not to go over 8 mg prednisone daily. Baseline daily wheeze and dyspnea, with occasional exacerbation. We have been able to keep him out  of hospital because he is good at self-management.  Failed Daliresp and Xolair. Several second-opinion visits to [facility] in 2013. They did not feel he was thermoplasty candidate.  Office spirometry 04/08/12- severe obstructive airways disease- FVC 2.64/58%, FEV1 1.30/36%, FEV1/FVC 0.49, FEF 25-75% 0.50/14%.  06/17/12- 48 yo never smoker with hx chronic obstructive asthma, ended Xolair June, 2013. ACUTE VISIT: chest tightness-feels more tightness in left side of chest, increased wheezing and SOB as well. Coughing-forces cough to bring mucus up. Worse asthma x2 weeks without fever, sore throat, itching or sneezing area and started herself back on Singulair. He has been on 8 mg of Medrol daily which is as low as he has been able to get in a long time. Using his nebulizer but seemed bring up scant clear mucus. He has not been taking Proventil tablets and has not tried the sample of Breo Ellipta.  11/03/12- 69 yo never  smoker with hx chronic obstructive asthma, ended Xolair June, 2013. GERD FOLLOWS FOR: breathing not the best; SOB and wheezing, chest congestion, cough -tightness in chest at times(able to get phlegm up after breathing tx's sometimes) Daily wheeze, cough, short of breath at baseline. Nebulizer produces clear sputum. Now has disability. Continues prednisone 10 mg daily. Occasionally aware of throat burning reflux despite Prilosec twice a day. CXR 06/17/12 IMPRESSION:  No acute finding.  Original Report Authenticated By: Orlean Patten, M.D.  03/16/13- 8 yo never smoker with hx chronic obstructive asthma, ended Xolair June, 2013. GERD FOLLOWS WUJ:WJXBJ like he is on the "fence" with his breathing being on Pred 10 mg and Dulera daily; unsure if he needs a shot of depo; can hear him wheezing(he states this happens all day long) Levaquin worked well. Now back on prednisone maintenance 10 mg daily plus Dulera but wheezing every day.h eartburn controlled. CXR 06/17/12 IMPRESSION:  No acute finding.  Original Report Authenticated By: Orlean Patten, M.D.  07/21/13- 74 yo never smoker with hx chronic obstructive asthma, ended Xolair June, 2013. GERD FOLLOWS FOR: P states his breathing is okay considering the weather and pollen. Head congestion, sinus pressure. He coughs some every day and uses nebulizer and rescue inhaler a couple of times per day to say and control. He expects seasonal flare with ragweed in the fall.  914/15- 32 yo never smoker with hx chronic obstructive asthma, ended Xolair June, 2013. GERD FOLLOWS FOR: occasional nonprod cough with clear mucus, SOB with exertion.  States he is feeling well.  On prednisone 10 mg daily for the last month, which he considers good. Home oximetry running around 92% on room air. Ruthe Mannan seems to work better as a maintenance inhaler. Admits occasional depression despite Lexapro, which he will discuss with his primary physician.  03/23/14-  38 yo never  smoker with hx chronic obstructive asthma, ended Xolair June, 2013. GERD FOLLOWS FOR:had flare up of Asthma late December and was given Doxycycline Rx with refill-had to take additional refill due to symptoms coming back-currently on it. Symptoms have stopped again but unsure if will come back. Has 2 more days of the current doxycycline prescription. Now taking prednisone 20 mg daily with goal of getting back to 10 mg daily. Wife and daughter are both sick now.  09/27/14- 87 yo never smoker with hx chronic obstructive asthma, ended Xolair June, 2013. GERD FOLLOWS FOR: Pt states his breathing is "terrible"; had to go on Doxy Rx as well as Pred 40mg  dose. Currently on Pred 25 mg QD tapering down.  Worse SOB, chest  tightness x 6 weeks. No clear cause- ?weather. No nasal congestion. Started doxy - finished 1 week ago. Prednisone burst to 40 mg, now at 25 mg.   Review of Systems-See HPI Constitutional:   No weight loss, night sweats,  Fevers, chills, fatigue, lassitude. HEENT:   No headaches,  Difficulty swallowing,  Tooth/dental problems,  Sore throat,                No sneezing, itching, ear ache, No-nasal congestion, post nasal drip,  CV:  No chest pain, orthopnea, PND, swelling in lower extremities, anasarca, dizziness, palpitations GI  +heartburn,+indigestion, no-abdominal pain, nausea, vomiting, Resp:   No excess mucus, no-productive cough,  + non-productive cough,  No coughing up of blood.  No change in color of mucus.  + wheezing. +Short of breath mainly w/ exertion.  Skin: no rash or lesions. GU:  MS:  No joint pain or swelling. Marland Kitchen Psych:  No change in mood or affect. No depression or anxiety.  No memory loss.  Objective:   Physical Exam  General- Alert, Oriented, Affect-appropriate, Distress- none acute,  Looks well Skin- rash-none, lesions- none, excoriation- none, +tanned Lymphadenopathy- none Head- atraumatic            Eyes- Gross vision intact, PERRLA, conjunctivae- clear  secretions            Ears- Hearing, canals normal            Nose- + sniffing, no-Septal dev,  polyps, erosion, perforation             Throat- Mallampati II , mucosa clear , drainage- none, tonsils- atrophic.  Neck- flexible , trachea midline, no stridor , thyroid nl, carotid no bruit Chest - symmetrical excursion , unlabored           Heart/CV- RRR , no murmur , no gallop  , no rub, nl s1 s2                           - JVD- none , edema- none, stasis changes- none, varices- none           Lung-    Wheeze+ R>L, unlabored , able to speak full sentences.  Cough+ ,                                             dullness-none, rub- none.            Chest wall-  Abd- Br/ Gen/ Rectal- Not done, not indicated Extrem- cyanosis- none, clubbing, none, atrophy- none, strength- nl Neuro- grossly intact to observation

## 2014-09-27 NOTE — Progress Notes (Signed)
Quick Note:  LVM for pt to return call ______ 

## 2014-09-28 NOTE — Progress Notes (Signed)
Quick Note:  Called and spoke with pt. Reviewed results and recs. Pt voiced understanding and had no further questions. ______ 

## 2014-11-23 ENCOUNTER — Telehealth: Payer: Self-pay | Admitting: Internal Medicine

## 2014-11-23 NOTE — Telephone Encounter (Signed)
Called and spoke to pt. Pt stated she took her FMLA form and her insurance form to [employer] on 8.29 and they sent the forms up to CY on 8.30 to be completed. The pt states it is FMLA and insurance form (a continence of disability form) both are through [employer].   [name] please advise if these have been completed yet. Thanks.

## 2014-11-24 NOTE — Telephone Encounter (Signed)
Mickel Baas notified. Nothing further needed.

## 2014-11-24 NOTE — Telephone Encounter (Signed)
Please let Mickel Baas know that forms have been completed and signed by CY; will be addressed by [employer] on Monday 11-27-14; sorry for the delay-we needed to get information and dates correct on forms. Thanks.

## 2014-12-01 ENCOUNTER — Other Ambulatory Visit: Payer: Self-pay | Admitting: Internal Medicine

## 2014-12-06 ENCOUNTER — Other Ambulatory Visit: Payer: Self-pay | Admitting: Internal Medicine

## 2014-12-07 NOTE — Telephone Encounter (Signed)
Pt requesting a refill on prednisone 10mg .  Per last two office notes I'm unclear on what pt's baseline dose of prednisone is; looks like pt has been taking anywhere from 10mg  to 40mg  qd.   Last refill was sent for prednisone 10mg - to take 4 tabs qd or as directed, #100 with 0 refills on 09/27/14.  Pt also has a prednisone 1mg  rx on his med list- to take 1 tab qd or as directed for weaning, #200 with 0 refills on 12/01/2014.  Last ov: 09/27/14.  CY please advise on appropriate refill for pt's prednisone.  Thanks!

## 2014-12-07 NOTE — Telephone Encounter (Signed)
Ok to refill 

## 2015-01-12 ENCOUNTER — Telehealth: Payer: Self-pay | Admitting: Internal Medicine

## 2015-01-12 MED ORDER — PREDNISONE 1 MG PO TABS: ORAL_TABLET | ORAL | Status: DC

## 2015-01-12 NOTE — Telephone Encounter (Signed)
Patient notified of Dr. Janee Morn recommendations. Patient notified to contact our office in 3 weeks if he can stay below 10mg , will order CBC w/ diff at that time if patient is able to stay below 10mg  Patient states he will call in 3 weeks with update.  FYI to [employer]

## 2015-01-12 NOTE — Telephone Encounter (Signed)
Ok to send Rx prednisone 1 mg tabs, # 50, refill x 5  Suggest he taper by 1 mg a week.   If he can stay below 10 mg daily for 2-3 weeks, then order CBC w diff   For dx asthma moderate persistent

## 2015-01-12 NOTE — Telephone Encounter (Signed)
Patient says that he is tapering his prednisone.  Dr. Annamaria Boots wanted him to let him know if he wants to go below 10mg .  Patient wants to know if we can call in 1mg  tablets and wants to know what rate he should taper.  Patient says that when he came in July that he had his Eosinophils checked and he wants to know if he needs to come back and have those checked again?    Dr. Annamaria Boots, please advise.

## 2015-01-19 ENCOUNTER — Telehealth: Payer: Self-pay | Admitting: Internal Medicine

## 2015-01-19 DIAGNOSIS — E2740 Unspecified adrenocortical insufficiency: Secondary | ICD-10-CM

## 2015-01-19 NOTE — Telephone Encounter (Signed)
Ok- please write down what I am supposed to say in that spa ce.

## 2015-01-19 NOTE — Telephone Encounter (Signed)
Patient's wife calling regarding patient's FMLA form.  She says that the (Incapacity: Episodic flare ups) section was not filled out on the form and she wanted to know if Dr. Annamaria Boots would be willing to fill that part out on the form and re-fax it.  She said that he usually gets 1-2 days per month for Episodic flare ups.    Dr. Annamaria Boots, please advise.

## 2015-01-22 NOTE — Telephone Encounter (Signed)
Spoke with pt's wife. States that CY filled this out before. It needs to say 1-2 episodes a month, 1-2 days per episode if needed.

## 2015-01-22 NOTE — Telephone Encounter (Signed)
Dr. Annamaria Boots, please specify what labs you want ordered.

## 2015-01-22 NOTE — Telephone Encounter (Signed)
Patient wants to know if he can get a Cortizone level test, Dexamethasone suppression test.  Patient says that he can come and have this done on Thursday.   Dr. Annamaria Boots, please advise.

## 2015-01-22 NOTE — Telephone Encounter (Signed)
[id number] pt calling wanting to talk to Joellen Jersey about his fmla forms  and his cortizone levels

## 2015-01-22 NOTE — Telephone Encounter (Signed)
Ok to order for dx adrenal insufficiency

## 2015-01-23 NOTE — Telephone Encounter (Signed)
Patient is calling, may be reached at [phone].

## 2015-01-23 NOTE — Telephone Encounter (Signed)
Waiting on rec from [facility]

## 2015-01-23 NOTE — Telephone Encounter (Signed)
[phone] pt calling back

## 2015-01-23 NOTE — Telephone Encounter (Signed)
CY please advise what labs need to be ordered. Pt is calling back.

## 2015-01-24 NOTE — Telephone Encounter (Signed)
Dr. Annamaria Boots, please advise what labs to order for this patient.

## 2015-01-25 ENCOUNTER — Telehealth: Payer: Self-pay | Admitting: Internal Medicine

## 2015-01-25 ENCOUNTER — Other Ambulatory Visit (INDEPENDENT_AMBULATORY_CARE_PROVIDER_SITE_OTHER): Payer: 59

## 2015-01-25 DIAGNOSIS — J4540 Moderate persistent asthma, uncomplicated: Secondary | ICD-10-CM

## 2015-01-25 LAB — CBC WITH DIFFERENTIAL/PLATELET
Basophils Absolute: 0 10*3/uL (ref 0.0–0.1)
Basophils Relative: 0.5 % (ref 0.0–3.0)
EOS ABS: 0.2 10*3/uL (ref 0.0–0.7)
Eosinophils Relative: 2 % (ref 0.0–5.0)
HEMATOCRIT: 45.9 % (ref 39.0–52.0)
Hemoglobin: 15.4 g/dL (ref 13.0–17.0)
LYMPHS PCT: 9.6 % — AB (ref 12.0–46.0)
Lymphs Abs: 0.8 10*3/uL (ref 0.7–4.0)
MCHC: 33.7 g/dL (ref 30.0–36.0)
MCV: 92.9 fl (ref 78.0–100.0)
Monocytes Absolute: 1 10*3/uL (ref 0.1–1.0)
Monocytes Relative: 11.7 % (ref 3.0–12.0)
NEUTROS ABS: 6.6 10*3/uL (ref 1.4–7.7)
Neutrophils Relative %: 76.2 % (ref 43.0–77.0)
PLATELETS: 259 10*3/uL (ref 150.0–400.0)
RBC: 4.93 Mil/uL (ref 4.22–5.81)
RDW: 12.8 % (ref 11.5–15.5)
WBC: 8.7 10*3/uL (ref 4.0–10.5)

## 2015-01-25 MED ORDER — MOXIFLOXACIN HCL 400 MG PO TABS: 400.0000 mg | ORAL_TABLET | Freq: Every day | ORAL | Status: DC

## 2015-01-25 NOTE — Telephone Encounter (Signed)
CY please specify labs to be done

## 2015-01-25 NOTE — Telephone Encounter (Signed)
Offer avelox 400 mg. # 7, 1 daily

## 2015-01-25 NOTE — Telephone Encounter (Signed)
Spoke with pt and is aware of below. Also he reports he has been on prednisone 10 mg for about 2-3 weeks now and is coming in for his labs as stated from 01/12/15 phone note.

## 2015-01-25 NOTE — Telephone Encounter (Signed)
Spoke with pt. Requesting appt today with Dr. Annamaria Boots, C/o fever of 100.0, prod cough (yellow phlem), chills, sweats, nasal cong and chest cong. Please advise Dr. Annamaria Boots thanks  Allergies  Allergen Reactions  . Erythromycin Ethylsuccinate     REACTION: unspecified  . Penicillins      Current Outpatient Prescriptions on File Prior to Visit  Medication Sig Dispense Refill  . albuterol (PROVENTIL) (2.5 MG/3ML) 0.083% nebulizer solution Use 1 vial via nebulizer  every 4 hours as needed 1500 mL 0  . albuterol (VENTOLIN HFA) 108 (90 BASE) MCG/ACT inhaler 2 puffs four times daily as needed 3 Inhaler 3  . Calcium Carbonate-Vitamin D (CALCIUM 600+D) 600-200 MG-UNIT TABS Take 1 tablet by mouth 2 (two) times daily.      . citalopram (CELEXA) 20 MG tablet Take 20 mg by mouth daily.    Ruthe Mannan 200-5 MCG/ACT AERO Use 2 puffs twice daily  (rinse mouth after each  use) 39 g 1  . EPINEPHrine 0.3 mg/0.3 mL IJ SOAJ injection Inject 0.3 mLs (0.3 mg total) into the muscle once. (Patient not taking: Reported on 03/23/2014) 1 Device prn  . fluticasone (FLONASE) 50 MCG/ACT nasal spray 2 puffs every 4 hours if needed- rescue 48 g 3  . guaiFENesin (MUCINEX) 600 MG 12 hr tablet Take 600 mg by mouth 2 (two) times daily.      Marland Kitchen ipratropium (ATROVENT) 0.02 % nebulizer solution Take 2.5 mLs (500 mcg total) by nebulization 4 (four) times daily. 675 mL 3  . Magnesium Cl-Calcium Carbonate (SLOW-MAG PO) Take 2 tablets by mouth daily.    . montelukast (SINGULAIR) 10 MG tablet Take 1 tablet by mouth at  bedtime 90 tablet 1  . omeprazole (PRILOSEC) 20 MG capsule Take 1 capsule (20 mg total) by mouth 2 (two) times daily. 180 capsule 0  . predniSONE (DELTASONE) 1 MG tablet TAKE 1 TABLET EVERY DAY OR AS DIRECTED FOR WEANING 200 tablet 0  . predniSONE (DELTASONE) 1 MG tablet Lower taper by 1mg  per week then stay at 10mg ; contact our office in 3 weeks 50 tablet 5  . predniSONE (DELTASONE) 10 MG tablet TAKE 4 TABLETS BY MOUTH EVERY DAY  OR AS DIRECTED 100 tablet 0   No current facility-administered medications on file prior to visit.

## 2015-01-26 ENCOUNTER — Telehealth: Payer: Self-pay | Admitting: Internal Medicine

## 2015-01-26 MED ORDER — FLUCONAZOLE 150 MG PO TABS: 150.0000 mg | ORAL_TABLET | Freq: Every day | ORAL | Status: DC

## 2015-01-26 NOTE — Telephone Encounter (Signed)
Patient notified of Dr. Janee Morn recommendations.\ Rx sent to pharmacy Nothing further needed. Closing encounter

## 2015-01-26 NOTE — Telephone Encounter (Signed)
Patient says that he has thrush.  White patches in the back of tongue, back of throat and upper palate.  Patient requesting medication to be called in.  [facility] [address]  Allergies  Allergen Reactions  . Erythromycin Ethylsuccinate     REACTION: unspecified  . Penicillins    Current Outpatient Prescriptions on File Prior to Visit  Medication Sig Dispense Refill  . albuterol (PROVENTIL) (2.5 MG/3ML) 0.083% nebulizer solution Use 1 vial via nebulizer  every 4 hours as needed 1500 mL 0  . albuterol (VENTOLIN HFA) 108 (90 BASE) MCG/ACT inhaler 2 puffs four times daily as needed 3 Inhaler 3  . Calcium Carbonate-Vitamin D (CALCIUM 600+D) 600-200 MG-UNIT TABS Take 1 tablet by mouth 2 (two) times daily.      Ruthe Mannan 200-5 MCG/ACT AERO Use 2 puffs twice daily  (rinse mouth after each  use) 39 g 1  . EPINEPHrine 0.3 mg/0.3 mL IJ SOAJ injection Inject 0.3 mLs (0.3 mg total) into the muscle once. (Patient not taking: Reported on 03/23/2014) 1 Device prn  . fluticasone (FLONASE) 50 MCG/ACT nasal spray 2 puffs every 4 hours if needed- rescue 48 g 3  . guaiFENesin (MUCINEX) 600 MG 12 hr tablet Take 600 mg by mouth 2 (two) times daily.      Marland Kitchen ipratropium (ATROVENT) 0.02 % nebulizer solution Take 2.5 mLs (500 mcg total) by nebulization 4 (four) times daily. 675 mL 3  . Magnesium Cl-Calcium Carbonate (SLOW-MAG PO) Take 2 tablets by mouth daily.    . montelukast (SINGULAIR) 10 MG tablet Take 1 tablet by mouth at  bedtime 90 tablet 1  . moxifloxacin (AVELOX) 400 MG tablet Take 1 tablet (400 mg total) by mouth daily. 7 tablet 0  . omeprazole (PRILOSEC) 20 MG capsule Take 1 capsule (20 mg total) by mouth 2 (two) times daily. 180 capsule 0  . predniSONE (DELTASONE) 1 MG tablet TAKE 1 TABLET EVERY DAY OR AS DIRECTED FOR WEANING 200 tablet 0  . predniSONE (DELTASONE) 1 MG tablet Lower taper by 1mg  per week then stay at 10mg ; contact our office in 3 weeks 50 tablet 5  . predniSONE (DELTASONE) 10 MG tablet TAKE 4  TABLETS BY MOUTH EVERY DAY OR AS DIRECTED 100 tablet 0   No current facility-administered medications on file prior to visit.

## 2015-01-26 NOTE — Telephone Encounter (Signed)
Diflucan 150 mg   # 7, 1 daily, ref x 3

## 2015-01-26 NOTE — Telephone Encounter (Signed)
CY would like patient to have ACTH (Cortrosyn) Stimulation test done at hospital; this where the patient is injected and then labs are drawn. Spoke with hospital lab and was told this could be done and then was told this would most likely need to go through Short Stay-[phone]. Short stay has closed until Monday 01-29-15 to get more information about the ACTH test. Pt's wife is aware of a call back first of next week once order is in place with Short Stay.

## 2015-01-29 NOTE — Telephone Encounter (Signed)
lmtcb for short stay.

## 2015-01-30 ENCOUNTER — Telehealth: Payer: Self-pay | Admitting: Internal Medicine

## 2015-01-30 DIAGNOSIS — R079 Chest pain, unspecified: Secondary | ICD-10-CM

## 2015-01-30 NOTE — Telephone Encounter (Signed)
I do not have any forms on patient for FMLA; please check with CY or HIM.

## 2015-01-30 NOTE — Telephone Encounter (Signed)
Patient's wife calling again to check on status of FMLA forms. +   Status: Signed       Expand All Collapse All   [id number] pt calling wanting to talk to Joellen Jersey about his fmla forms and his cortizone levels            Randa Spike, CMA at 01/22/2015 9:25 AM     Status: Signed       Expand All Collapse All   Spoke with pt's wife. States that CY filled this out before. It needs to say 1-2 episodes a month, 1-2 days per episode if needed.            Deneise Lever, MD at 01/19/2015 12:31 PM     Status: Signed       Expand All Collapse All   Ok- please write down what I am supposed to say in that spa ce.             Glean Hess, CMA at 01/19/2015 10:15 AM     Status: Signed       Expand All Collapse All   Patient's wife calling regarding patient's FMLA form. She says that the (Incapacity: Episodic flare ups) section was not filled out on the form and she wanted to know if Dr. Annamaria Boots would be willing to fill that part out on the form and re-fax it. She said that he usually gets 1-2 days per month for Episodic flare ups.         Joellen Jersey - has this been taken care of?  Patient says that forms have not been re-submitted yet with the information on it that she is requesting.

## 2015-01-30 NOTE — Telephone Encounter (Signed)
Spoke with pt's wife. Reports increased back pain and chest pain. Started antibiotic but the pain is getting worse. Needs to know if he needs to see CY again or get an xray?  CY - please advise. Thanks.

## 2015-01-30 NOTE — Telephone Encounter (Signed)
Per CY-we can offer patient to come by and outpt CXR prior to Thanksgiving coming up; he can continue taking abx and ramp up his prednisone. Thanks

## 2015-01-30 NOTE — Telephone Encounter (Signed)
CY do you have these forms?

## 2015-01-30 NOTE — Telephone Encounter (Signed)
Patients wife notified of Dr. Janee Morn recommendations. CXR ordered. Nothing further needed. Closing encounter

## 2015-01-30 NOTE — Telephone Encounter (Signed)
LMTCB x 2 for short stay

## 2015-01-30 NOTE — Telephone Encounter (Signed)
Please call and try to get a clearer description of what is going on. Does it seem like an infection or not?

## 2015-01-30 NOTE — Telephone Encounter (Signed)
Spoke with pt's wife again. Reports wheezing, SOB, chest tightness and coughing with production of yellow mucus. Last week ran a fever a few nights of 100.2. Does not know if needs OV or if something can be called in.

## 2015-01-31 ENCOUNTER — Ambulatory Visit (INDEPENDENT_AMBULATORY_CARE_PROVIDER_SITE_OTHER): Payer: 59 | Admitting: Internal Medicine

## 2015-01-31 ENCOUNTER — Telehealth: Payer: Self-pay | Admitting: Emergency Medicine

## 2015-01-31 ENCOUNTER — Encounter: Payer: Self-pay | Admitting: Internal Medicine

## 2015-01-31 ENCOUNTER — Ambulatory Visit (INDEPENDENT_AMBULATORY_CARE_PROVIDER_SITE_OTHER)
Admission: RE | Admit: 2015-01-31 | Discharge: 2015-01-31 | Disposition: A | Discharge status: Home / Self Care | Payer: 59 | Source: Ambulatory Visit | Attending: Internal Medicine | Admitting: Internal Medicine

## 2015-01-31 VITALS — BP 100/60 | HR 94 | Ht 68.0 in | Wt 176.4 lb

## 2015-01-31 DIAGNOSIS — J449 Chronic obstructive pulmonary disease, unspecified: Secondary | ICD-10-CM | POA: Diagnosis not present

## 2015-01-31 DIAGNOSIS — E279 Disorder of adrenal gland, unspecified: Secondary | ICD-10-CM

## 2015-01-31 DIAGNOSIS — J45909 Unspecified asthma, uncomplicated: Secondary | ICD-10-CM | POA: Diagnosis not present

## 2015-01-31 DIAGNOSIS — R079 Chest pain, unspecified: Secondary | ICD-10-CM

## 2015-01-31 DIAGNOSIS — R0789 Other chest pain: Secondary | ICD-10-CM | POA: Diagnosis not present

## 2015-01-31 MED ORDER — TRAMADOL HCL 50 MG PO TABS: 50.0000 mg | ORAL_TABLET | Freq: Four times a day (QID) | ORAL | Status: DC | PRN

## 2015-01-31 NOTE — Telephone Encounter (Signed)
Forms were filled out today at patients acute visit. Nothing more needed at this time.

## 2015-01-31 NOTE — Progress Notes (Signed)
Patient ID: Eric [name], male    DOB: [birth year], 57 y.o.   MRN: [id number]  HPI 4/ 3/12- 44 yo never smoker with hx chronic obstructive asthma, now on Xolair. Xolair is being dosed 150 mg per month, but he feels it wearing off by 3 weeks and asks about a dosing change. He remains on prednisone 5 mg daily. At 3 weeks after a xolair dose he begins to feel tighter, more wheeze. He had to miss work today because of asthma and he increased prednisone today to 20 mg. . He denies obvious change with seasonal pollens- no itch, sneeze,watery eyes.   10/24/10- 75 yo never smoker with hx chronic obstructive asthma, now on Xolair. Has continued Xoalir monthly. One injection/ month lasted few weeks-not a month. Increased to 2 injections/ month, same total dose,  and not as clearly helping. Has continued chronic maintenance prednisone. We discussed allergic vs nonallergic triggers. He is watching what happens as we enter Fall season. He had to miss work due to asthma flare 2 days ago and was aggresively using neb. Trigger was lawnmower smoke in garage.  Today is great. Currently on prednisone 20 mg daily. He remembers bone density check by Dr Lavone Orn within last few years.  We discussed Daliresp and he is interested in giving it another try.   04/18/11- 27 yo never smoker with hx chronic obstructive asthma, now on Xolair. He has been trying to get to lower maintenance prednisone dose and asked for higher Xolair dose. We increased him 300-> Xolair 375 mg  monthly.    Wife here  FOLLOWS FOR: been out of work for 3 days as of today-doesnt want to go up past prednisone 15mg  (was on 30mg ),using Zyflo. Wheezing increased-getting up in middle of night to do breathing tx's.   He was on prednisone 40 mg daily for several weeks before trying to drop down. Using nebulizer at night. Concerned about increased body fat from prednisone. We have had many long conversations about steroid side effects and alternatives over  the years. Theophylline did work when he was in college. Office spirometry: Severe obstructive airways disease. FEV1 1.29/35%, FEV1/FEC 0.46.  06/23/11- 56 yo never smoker with hx chronic obstructive asthma, now on Xolair. We had referred him to do, with the expectation he might be considered for thermal bronchoscopy to treat his asthma. They're treating him for reflux with Prilosec twice daily. He feels a Xolair 375 mg is helping after 2 doses, the way the first few doses did at 275 mg. He was just able to reduce his prednisone to 5 mg daily with mild residual wheeze. We reviewed findings associated with steroid withdrawal and adrenal insufficiency and discussed endocrinology referral to help with this issue.  08/20/11- 53 yo never smoker with hx chronic obstructive asthma, now on Xolair. Pt c/o increased SOB, chest tightness and prod cough x 1 week  Pt tx with Pred 30 mg x1 week then increased to 50mg  qd x 2days. Pt denies f/c/s.  Pt states even with the prednisone he cannot get relief. He has been on 30 mg of prednisone for most of this spring on a daily basis, usually with some wheeze but able to work. In the last day or 2 chest tightness and wheeze have been worse without specific reason. He had to increase prednisone to 50 mg daily. He continues Xolair injections. No recent colds or obvious exposures. Scant phlegm with trace yellow. He denies sinus problems. Taking Prilosec twice daily as  directed by the pulmonologist we sent him to at [facility]. We sent him specifically for evaluation of thermoplasty. He apparently is a little outside of their selection criteria but they're considering him. He has missed work intermittently. There is no part-time work available but he feels unable to do a 40 hour work week anymore  09/17/11- 82 yo never smoker with hx chronic obstructive asthma, now on Xolair.  Pt states he has notices a lot of improvement while on the Pred taper (now on 10mg ). Pt c/o slight wheezing and  sob, but not too concerned. Pt denies chest tightness, prod cough.  Discussed his work with the pulmonologist at [facility]. They emphasize management of his GERD, demonstrated with barium swallow. They plan PFTs at his next visit so we won't do them here. He has been out of work now for 3 months. 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 He continues Brovana and Atrovent nebulizers twice daily with rare need now for rescue inhaler. He did like Arcapta and we compared that to his nebulizer. He failed Singulair and Zyflo.  11/07/11- 61 yo never smoker with hx chronic obstructive asthma, ended Xolair June, 2013.    Wife here. Still having increased SOB-currently on 8mg  Prednisone-stilll out of work. Wheezing as well and cough. Recent exacer- improved after doxy. Has been able to maintain on prednisone 8 mg daily but no lower. [facility] said not good candidate for thermoplasty. Short term disability through Sept 11.  Long discussion of options, meds. Will try Brovana neb in AM/ Arcapta in PM.   12/10/11- 61 yo never smoker with hx chronic obstructive asthma, ended Xolair June, 2013.    Wife here. Still having SOB and wheezing at times; still out of work. Can tolerate light to moderate exertion on some days, but on other days wheezing dyspnea is too limiting. He is pleased to be would remain on only 8 mg daily prednisone now for 2 months at that dose. We have carefully discussed long-term steroid side effects. He asks extension of leave of absence one more month and has applied for total and permanent disability. Now out of work x3 months. No longer being followed at [facility]. COPD assessment test (CAT) score 22/40.  01/07/12- 47 yo never smoker with hx chronic obstructive asthma, ended Xolair June, 2013.  Breathing no better but no worse than last time He considers that a good thing that he has been able to hold prednisone maintenance at 8 milligrams  daily. Exertional dyspnea easy fatigue little cough no chest pain or fever. He talks about the possibility of going back to work but it doesn't seem realistic. We agreed to extend his short term disability. CXR 12/30/11-    IMPRESSION:  1. Stable cardiomegaly with now mild pulmonary vascular  congestion.  2. Stable scarring at the lung apices bilaterally.  Original Report Authenticated By: Resa Miner. MATTERN, M.D.   04/08/12- 52 yo never smoker with hx chronic obstructive asthma, ended Xolair June, 2013.  FOLLOWS FOR: wheezing; has gotten worse in past 2-3 weeks even after albuterol and atrovent this morning. Acute exacerbation x 2-3 weeks-? Weather, but has not had a cold. Using all meds, incl used neb/ Duoneb 2 hours before arival this AM.  Remains out of work appropriately since June, 2013. Chronic asthma with COPD, steroid dependent trying not to go over 8 mg prednisone daily. Baseline daily wheeze and dyspnea, with occasional exacerbation. We have been able to keep him out  of hospital because he is good at self-management.  Failed Daliresp and Xolair. Several second-opinion visits to [facility] in 2013. They did not feel he was thermoplasty candidate.  Office spirometry 04/08/12- severe obstructive airways disease- FVC 2.64/58%, FEV1 1.30/36%, FEV1/FVC 0.49, FEF 25-75% 0.50/14%.  06/17/12- 48 yo never smoker with hx chronic obstructive asthma, ended Xolair June, 2013. ACUTE VISIT: chest tightness-feels more tightness in left side of chest, increased wheezing and SOB as well. Coughing-forces cough to bring mucus up. Worse asthma x2 weeks without fever, sore throat, itching or sneezing area and started herself back on Singulair. He has been on 8 mg of Medrol daily which is as low as he has been able to get in a long time. Using his nebulizer but seemed bring up scant clear mucus. He has not been taking Proventil tablets and has not tried the sample of Breo Ellipta.  11/03/12- 69 yo never  smoker with hx chronic obstructive asthma, ended Xolair June, 2013. GERD FOLLOWS FOR: breathing not the best; SOB and wheezing, chest congestion, cough -tightness in chest at times(able to get phlegm up after breathing tx's sometimes) Daily wheeze, cough, short of breath at baseline. Nebulizer produces clear sputum. Now has disability. Continues prednisone 10 mg daily. Occasionally aware of throat burning reflux despite Prilosec twice a day. CXR 06/17/12 IMPRESSION:  No acute finding.  Original Report Authenticated By: Orlean Patten, M.D.  03/16/13- 8 yo never smoker with hx chronic obstructive asthma, ended Xolair June, 2013. GERD FOLLOWS WUJ:WJXBJ like he is on the "fence" with his breathing being on Pred 10 mg and Dulera daily; unsure if he needs a shot of depo; can hear him wheezing(he states this happens all day long) Levaquin worked well. Now back on prednisone maintenance 10 mg daily plus Dulera but wheezing every day.h eartburn controlled. CXR 06/17/12 IMPRESSION:  No acute finding.  Original Report Authenticated By: Orlean Patten, M.D.  07/21/13- 74 yo never smoker with hx chronic obstructive asthma, ended Xolair June, 2013. GERD FOLLOWS FOR: P states his breathing is okay considering the weather and pollen. Head congestion, sinus pressure. He coughs some every day and uses nebulizer and rescue inhaler a couple of times per day to say and control. He expects seasonal flare with ragweed in the fall.  914/15- 32 yo never smoker with hx chronic obstructive asthma, ended Xolair June, 2013. GERD FOLLOWS FOR: occasional nonprod cough with clear mucus, SOB with exertion.  States he is feeling well.  On prednisone 10 mg daily for the last month, which he considers good. Home oximetry running around 92% on room air. Ruthe Mannan seems to work better as a maintenance inhaler. Admits occasional depression despite Lexapro, which he will discuss with his primary physician.  03/23/14-  38 yo never  smoker with hx chronic obstructive asthma, ended Xolair June, 2013. GERD FOLLOWS FOR:had flare up of Asthma late December and was given Doxycycline Rx with refill-had to take additional refill due to symptoms coming back-currently on it. Symptoms have stopped again but unsure if will come back. Has 2 more days of the current doxycycline prescription. Now taking prednisone 20 mg daily with goal of getting back to 10 mg daily. Wife and daughter are both sick now.  09/27/14- 87 yo never smoker with hx chronic obstructive asthma, ended Xolair June, 2013. GERD FOLLOWS FOR: Pt states his breathing is "terrible"; had to go on Doxy Rx as well as Pred 40mg  dose. Currently on Pred 25 mg QD tapering down.  Worse SOB, chest  tightness x 6 weeks. No clear cause- ?weather. No nasal congestion. Started doxy - finished 1 week ago. Prednisone burst to 40 mg, now at 25 mg.   01/31/2015-59 year old male never smoker followed for chronic obstructive asthma, ended Xolair 0000000, complicated by GERD Acute: Follows For: pt states he is having sharp upper left chest pains thats appeared within the last 48 hours and has had a fever. pt states he always has SOB but his breathing is under control. pt had STAT CXR done today. He had recently requested adrenal gland function assessment and a cortisone stimulation test is scheduled Onset of fever and myalgias one week ago. Sharp left anterior chest pain began 48 hours ago. Low back pain present about 2 weeks. Fever now gone. No change in chronic cough with slight yellow sputum. Denies sore throat or rash. Continuing prednisone 10 mg daily. He was trying 9 mg but ran out of 1 mg tabs. Began Avelox prescribed yesterday and has Diflucan for question of thrush. No discomfort in legs. CXR 01/31/2015-image reviewed with him and his wife looks stable with chronic asymmetry, increased vascular perfusion right lung relative to left and scoliosis IMPRESSION: 1. No active lung disease. Cannot  exclude tiny left pleural effusion. 2. Peribronchial thickening may indicate bronchitis. Electronically Signed  By: Ivar Drape M.D.  On: 01/31/2015 09:18  Review of Systems-See HPI Constitutional:   No weight loss, night sweats,  Fevers, chills, fatigue, lassitude. HEENT:   No headaches,  Difficulty swallowing,  Tooth/dental problems,  Sore throat,                No sneezing, itching, ear ache, No-nasal congestion, post nasal drip,  CV:  + chest pain, orthopnea, PND, swelling in lower extremities, anasarca, dizziness, palpitations GI  +heartburn,+indigestion, no-abdominal pain, nausea, vomiting, Resp:   No excess mucus, no-productive cough,  + non-productive cough,  No coughing up of blood.  No change in color of mucus.  + wheezing. +Short of breath mainly w/ exertion.  Skin: no rash or lesions. GU:  MS:  No joint pain or swelling. Marland Kitchen Psych:  No change in mood or affect. No depression or anxiety.  No memory loss.  Objective:   Physical Exam  General- Alert, Oriented, Affect-appropriate, Distress- none acute,  Looks well Skin- rash-none, lesions- none, excoriation- none, +tanned Lymphadenopathy- none Head- atraumatic            Eyes- Gross vision intact, PERRLA, conjunctivae- clear secretions            Ears- Hearing, canals normal            Nose- + sniffing, no-Septal dev,  polyps, erosion, perforation             Throat- Mallampati II , mucosa clear , drainage- none, tonsils- atrophic.  Neck- flexible , trachea midline, no stridor , thyroid nl, carotid no bruit Chest - symmetrical excursion , unlabored           Heart/CV- RRR , no murmur , no gallop  , no rub, nl s1 s2                           - JVD- none , edema- none, stasis changes- none, varices- none           Lung-    Wheeze+ mild bilateral, unlabored , able to speak full sentences.  Cough+ ,  dullness-none, rub- none.            Chest wall-  Abd- Br/ Gen/ Rectal- Not done,  not indicated Extrem- cyanosis- none, clubbing, none, atrophy- none, strength- nl Neuro- grossly intact to observation

## 2015-01-31 NOTE — Patient Instructions (Addendum)
Script for Tramadol for cough and pain as needed  Fluids, finish the antibiotic, avoid chilling and exhaustion . Heating pad may help.  Keep January 20 appointment

## 2015-01-31 NOTE — Telephone Encounter (Signed)
Eric [name] has spoken with short stay and is awaiting fax from them. Will keep in her inbox.

## 2015-01-31 NOTE — Telephone Encounter (Signed)
Pt came in for acute visit today with CY; he is aware that order has been placed and PCC's are working on this order for patient today.

## 2015-01-31 NOTE — Telephone Encounter (Signed)
Pt calling, continues to have L sided CP no better with tramadol 50mg . Pain has worsened. CXR today at office showed small L effusion. With regard to the pain will add ibuprofen and increase the tramadol to 100mg . Also I believe he needs to have d-dimer drawn and possibly a CT-PA depending on the result. PE could cause an effusion. I have asked them to do this tonight. They plan to [facility].

## 2015-02-02 DIAGNOSIS — R0789 Other chest pain: Secondary | ICD-10-CM | POA: Insufficient documentation

## 2015-02-02 NOTE — Assessment & Plan Note (Signed)
Favor viral pleurisy syndrome unless he develops shingles rash. I do not think this is cardiac or pericardial pain. Plan-ibuprofen, heating pad, prescription for tramadol, finish Avelox

## 2015-02-02 NOTE — Assessment & Plan Note (Signed)
Suspect viral syndrome but it is not a lower respiratory/bronchitis exacerbation yet. Plan-encourage fluids, finish Avelox

## 2015-02-02 NOTE — Assessment & Plan Note (Signed)
He is a had a lot of steroids over time and we discussed possibility of adrenal insufficiency Plan-adrenal stimulation test pending

## 2015-02-10 ENCOUNTER — Emergency Department (HOSPITAL_BASED_OUTPATIENT_CLINIC_OR_DEPARTMENT_OTHER): Payer: 59

## 2015-02-10 ENCOUNTER — Emergency Department (HOSPITAL_BASED_OUTPATIENT_CLINIC_OR_DEPARTMENT_OTHER)
Admission: EM | Admit: 2015-02-10 | Discharge: 2015-02-10 | Disposition: A | Discharge status: Home / Self Care | Payer: 59 | Attending: Emergency Medicine | Admitting: Emergency Medicine

## 2015-02-10 ENCOUNTER — Encounter (HOSPITAL_BASED_OUTPATIENT_CLINIC_OR_DEPARTMENT_OTHER): Payer: Self-pay | Admitting: Emergency Medicine

## 2015-02-10 DIAGNOSIS — Z792 Long term (current) use of antibiotics: Secondary | ICD-10-CM | POA: Insufficient documentation

## 2015-02-10 DIAGNOSIS — R079 Chest pain, unspecified: Secondary | ICD-10-CM | POA: Insufficient documentation

## 2015-02-10 DIAGNOSIS — Z79899 Other long term (current) drug therapy: Secondary | ICD-10-CM | POA: Diagnosis not present

## 2015-02-10 DIAGNOSIS — Z880 Allergy status to penicillin: Secondary | ICD-10-CM | POA: Insufficient documentation

## 2015-02-10 DIAGNOSIS — J449 Chronic obstructive pulmonary disease, unspecified: Secondary | ICD-10-CM | POA: Insufficient documentation

## 2015-02-10 DIAGNOSIS — K219 Gastro-esophageal reflux disease without esophagitis: Secondary | ICD-10-CM | POA: Insufficient documentation

## 2015-02-10 DIAGNOSIS — Z7952 Long term (current) use of systemic steroids: Secondary | ICD-10-CM | POA: Diagnosis not present

## 2015-02-10 DIAGNOSIS — M542 Cervicalgia: Secondary | ICD-10-CM | POA: Diagnosis not present

## 2015-02-10 DIAGNOSIS — Z87442 Personal history of urinary calculi: Secondary | ICD-10-CM | POA: Diagnosis not present

## 2015-02-10 DIAGNOSIS — R202 Paresthesia of skin: Secondary | ICD-10-CM | POA: Insufficient documentation

## 2015-02-10 LAB — CBC WITH DIFFERENTIAL/PLATELET
Basophils Absolute: 0.1 10*3/uL (ref 0.0–0.1)
Basophils Relative: 1 %
EOS ABS: 0.1 10*3/uL (ref 0.0–0.7)
EOS PCT: 1 %
HCT: 45.5 % (ref 39.0–52.0)
Hemoglobin: 14.6 g/dL (ref 13.0–17.0)
LYMPHS ABS: 1 10*3/uL (ref 0.7–4.0)
Lymphocytes Relative: 10 %
MCH: 30.4 pg (ref 26.0–34.0)
MCHC: 32.1 g/dL (ref 30.0–36.0)
MCV: 94.6 fL (ref 78.0–100.0)
MONO ABS: 0.7 10*3/uL (ref 0.1–1.0)
MONOS PCT: 7 %
Neutro Abs: 8.8 10*3/uL — ABNORMAL HIGH (ref 1.7–7.7)
Neutrophils Relative %: 81 %
PLATELETS: 281 10*3/uL (ref 150–400)
RBC: 4.81 MIL/uL (ref 4.22–5.81)
RDW: 12.6 % (ref 11.5–15.5)
WBC: 10.7 10*3/uL — AB (ref 4.0–10.5)

## 2015-02-10 LAB — BASIC METABOLIC PANEL
Anion gap: 6 (ref 5–15)
BUN: 13 mg/dL (ref 6–20)
CALCIUM: 9.3 mg/dL (ref 8.9–10.3)
CO2: 29 mmol/L (ref 22–32)
CREATININE: 0.77 mg/dL (ref 0.61–1.24)
Chloride: 104 mmol/L (ref 101–111)
GFR calc Af Amer: >60 mL/min (ref >60)
GLUCOSE: 104 mg/dL — AB (ref 65–99)
Potassium: 4.4 mmol/L (ref 3.5–5.1)
SODIUM: 139 mmol/L (ref 135–145)

## 2015-02-10 LAB — TROPONIN I: Troponin I: <0.03 ng/mL (ref <0.031)

## 2015-02-10 MED ORDER — DIAZEPAM 5 MG PO TABS: 5.0000 mg | ORAL_TABLET | Freq: Two times a day (BID) | ORAL | Status: DC

## 2015-02-10 NOTE — Discharge Instructions (Signed)
Cervical Sprain  A cervical sprain is an injury in the neck in which the strong, fibrous tissues (ligaments) that connect your neck bones stretch or tear. Cervical sprains can range from mild to severe. Severe cervical sprains can cause the neck vertebrae to be unstable. This can lead to damage of the spinal cord and can result in serious nervous system problems. The amount of time it takes for a cervical sprain to get better depends on the cause and extent of the injury. Most cervical sprains heal in 1 to 3 weeks.  CAUSES   Severe cervical sprains may be caused by:    Contact sport injuries (such as from football, rugby, wrestling, hockey, auto racing, gymnastics, diving, martial arts, or boxing).    Motor vehicle collisions.    Whiplash injuries. This is an injury from a sudden forward and backward whipping movement of the head and neck.   Falls.   Mild cervical sprains may be caused by:    Being in an awkward position, such as while cradling a telephone between your ear and shoulder.    Sitting in a chair that does not offer proper support.    Working at a poorly designed computer station.    Looking up or down for long periods of time.   SYMPTOMS    Pain, soreness, stiffness, or a burning sensation in the front, back, or sides of the neck. This discomfort may develop immediately after the injury or slowly, 24 hours or more after the injury.    Pain or tenderness directly in the middle of the back of the neck.    Shoulder or upper back pain.    Limited ability to move the neck.    Headache.    Dizziness.    Weakness, numbness, or tingling in the hands or arms.    Muscle spasms.    Difficulty swallowing or chewing.    Tenderness and swelling of the neck.   DIAGNOSIS   Most of the time your health care provider can diagnose a cervical sprain by taking your history and doing a physical exam. Your health care provider will ask about previous neck injuries and any known neck  problems, such as arthritis in the neck. X-rays may be taken to find out if there are any other problems, such as with the bones of the neck. Other tests, such as a CT scan or MRI, may also be needed.   TREATMENT   Treatment depends on the severity of the cervical sprain. Mild sprains can be treated with rest, keeping the neck in place (immobilization), and pain medicines. Severe cervical sprains are immediately immobilized. Further treatment is done to help with pain, muscle spasms, and other symptoms and may include:   Medicines, such as pain relievers, numbing medicines, or muscle relaxants.    Physical therapy. This may involve stretching exercises, strengthening exercises, and posture training. Exercises and improved posture can help stabilize the neck, strengthen muscles, and help stop symptoms from returning.   HOME CARE INSTRUCTIONS    Put ice on the injured area.     Put ice in a plastic bag.     Place a towel between your skin and the bag.     Leave the ice on for 15-20 minutes, 3-4 times a day.    If your injury was severe, you may have been given a cervical collar to wear. A cervical collar is a two-piece collar designed to keep your neck from moving while it heals.      Do not remove the collar unless instructed by your health care provider.    If you have long hair, keep it outside of the collar.    Ask your health care provider before making any adjustments to your collar. Minor adjustments may be required over time to improve comfort and reduce pressure on your chin or on the back of your head.    Ifyou are allowed to remove the collar for cleaning or bathing, follow your health care provider's instructions on how to do so safely.    Keep your collar clean by wiping it with mild soap and water and drying it completely. If the collar you have been given includes removable pads, remove them every 1-2 days and hand wash them with soap and water. Allow them to air dry. They should be completely  dry before you wear them in the collar.    If you are allowed to remove the collar for cleaning and bathing, wash and dry the skin of your neck. Check your skin for irritation or sores. If you see any, tell your health care provider.    Do not drive while wearing the collar.    Only take over-the-counter or prescription medicines for pain, discomfort, or fever as directed by your health care provider.    Keep all follow-up appointments as directed by your health care provider.    Keep all physical therapy appointments as directed by your health care provider.    Make any needed adjustments to your workstation to promote good posture.    Avoid positions and activities that make your symptoms worse.    Warm up and stretch before being active to help prevent problems.   SEEK MEDICAL CARE IF:    Your pain is not controlled with medicine.    You are unable to decrease your pain medicine over time as planned.    Your activity level is not improving as expected.   SEEK IMMEDIATE MEDICAL CARE IF:    You develop any bleeding.   You develop stomach upset.   You have signs of an allergic reaction to your medicine.    Your symptoms get worse.    You develop new, unexplained symptoms.    You have numbness, tingling, weakness, or paralysis in any part of your body.   MAKE SURE YOU:    Understand these instructions.   Will watch your condition.   Will get help right away if you are not doing well or get worse.     This information is not intended to replace advice given to you by your health care provider. Make sure you discuss any questions you have with your health care provider.     Document Released: 12/22/2006 Document Revised: 03/01/2013 Document Reviewed: 09/01/2012  [employer] Interactive Patient Education 2016 [employer]

## 2015-02-10 NOTE — ED Notes (Signed)
States began having chest pain approx 2 weeks ago, saw pulmonolgist with a resp infection, has had pneumonia before, was r/o by MD with last visit. States he took Tramadol for 2-3 days and relief obtained, presents now with return of chest pain, "very sharpe pain, nerve pain" at left ant chest, non-radiating. Denies SOB, N/V.

## 2015-02-10 NOTE — ED Notes (Signed)
Pt with pmh of asthma and scoliosis, presenting to er with chest pain x 5 days, was seen Monday at pulmonologist for left upper sharp pain that patient thought was related to his asthma, today pain has increased and pain is reproducible and is worse with head movement

## 2015-02-10 NOTE — ED Notes (Signed)
MD at bedside. 

## 2015-02-10 NOTE — ED Provider Notes (Signed)
CSN: [id number]     Arrival date & time 02/10/15  1251 History   First MD Initiated Contact with Patient 02/10/15 1405     Chief Complaint  Patient presents with  . Chest Pain     (Consider location/radiation/quality/duration/timing/severity/associated sxs/prior Treatment) HPI   Patient to the ER with complaints of chest pains that started two weeks ago. This pain is unlike any he has had in the past. The patients pain is illicited with movement of his neck, specifically when he moves his head towards his left shoulder. He was seen by his pulmonologist twice for this and had infection/pulmonary etiology r/o. He came to the ER today because when he woke up this morning he had difficulty moving his head to the left and when he did he almost passed out due to the severe pain. He has had some mild intermittent tingling. When he has pain it is over his left pectoral muscle and sharp/stabbing.   PCP: Irven Shelling, MD  ROS: The patient denies diaphoresis, fever, headache, weakness (general or focal), confusion, change of vision,  dysphagia, aphagia, shortness of breath,  abdominal pains, nausea, vomiting, diarrhea, lower extremity swelling, rash.   Past Medical History  Diagnosis Date  . Unspecified sinusitis (chronic)   . Allergic rhinitis, cause unspecified   . Chronic airway obstruction, not elsewhere classified   . Unspecified asthma(493.90)     start Xolair 03-2010  . Lung nodule     13x1mm posterior RLL CT 06-27-09  . GERD (gastroesophageal reflux disease)   . History of kidney stones    Past Surgical History  Procedure Laterality Date  . Lobectomy  age 59    Right middle with tracheostomy  . Sympathectomy      Right carotid body removed for asthma  . Ventilator dependent respiratory failure  1989    asthma   . Many asthma hospitalizations    . Nasal sinus surgery  1990    x 2  . Submandibular salivary glands removed  2012   Family History  Problem Relation Age of  Onset  . Cancer Mother     lymphoepithelial  . Asthma Father     allergic  . Asthma Brother     allergic  . Testicular cancer Maternal Grandfather   . Cancer Other     Grandfather-Prostate Cancer   Social History  Substance Use Topics  . Smoking status: Never Smoker   . Smokeless tobacco: None  . Alcohol Use: Yes     Comment: rarely    Review of Systems   Review of Systems  Gen: no weight loss, fevers, chills, night sweats  Eyes: no occular draining, occular pain,  No visual changes  Nose: no epistaxis or rhinorrhea  Mouth: no dental pain, no sore throat  Neck: no midline neck pain  Lungs: No hemoptysis. No wheezing or coughing CV:  No palpitations, dependent edema or orthopnea.  Abd: no diarrhea. No nausea or vomiting, No abdominal pain  GU: no dysuria or gross hematuria  MSK:  No muscle weakness, No muscular pain Neuro: no headache, no focal neurologic deficits  Skin: no rash , no wounds Psyche: no complaints of depression or anxiety    Allergies  Erythromycin ethylsuccinate and Penicillins  Home Medications   Prior to Admission medications   Medication Sig Start Date End Date Taking? Authorizing Provider  albuterol (PROVENTIL) (2.5 MG/3ML) 0.083% nebulizer solution Use 1 vial via nebulizer  every 4 hours as needed 03/13/14   Deneise Lever,  MD  albuterol (VENTOLIN HFA) 108 (90 BASE) MCG/ACT inhaler 2 puffs four times daily as needed 03/16/13 09/27/14  Deneise Lever, MD  Calcium Carbonate-Vitamin D (CALCIUM 600+D) 600-200 MG-UNIT TABS Take 1 tablet by mouth 2 (two) times daily.      Historical Provider, MD  diazepam (VALIUM) 5 MG tablet Take 1 tablet (5 mg total) by mouth 2 (two) times daily. 02/10/15   Berlie Hatchel Carlota Raspberry, PA-C  DULERA 200-5 MCG/ACT AERO Use 2 puffs twice daily  (rinse mouth after each  use) 09/12/14   Deneise Lever, MD  EPINEPHrine 0.3 mg/0.3 mL IJ SOAJ injection Inject 0.3 mLs (0.3 mg total) into the muscle once. Patient not taking: Reported on  03/23/2014 07/21/13   Deneise Lever, MD  fluconazole (DIFLUCAN) 150 MG tablet Take 1 tablet (150 mg total) by mouth daily. Patient not taking: Reported on 01/31/2015 01/26/15   Deneise Lever, MD  fluticasone [facility]) 50 MCG/ACT nasal spray 2 puffs every 4 hours if needed- rescue 07/21/13   Deneise Lever, MD  guaiFENesin (MUCINEX) 600 MG 12 hr tablet Take 600 mg by mouth 2 (two) times daily.      Historical Provider, MD  ipratropium (ATROVENT) 0.02 % nebulizer solution Take 2.5 mLs (500 mcg total) by nebulization 4 (four) times daily. 07/21/13   Deneise Lever, MD  Magnesium Cl-Calcium Carbonate (SLOW-MAG PO) Take 2 tablets by mouth daily.    Historical Provider, MD  montelukast (SINGULAIR) 10 MG tablet Take 1 tablet by mouth at  bedtime 01/05/14   Deneise Lever, MD  moxifloxacin (AVELOX) 400 MG tablet Take 1 tablet (400 mg total) by mouth daily. 01/25/15   Deneise Lever, MD  omeprazole (PRILOSEC) 20 MG capsule Take 1 capsule (20 mg total) by mouth 2 (two) times daily. 11/26/11   Deneise Lever, MD  predniSONE (DELTASONE) 10 MG tablet TAKE 4 TABLETS BY MOUTH EVERY DAY OR AS DIRECTED 12/07/14   Deneise Lever, MD  traMADol (ULTRAM) 50 MG tablet Take 1 tablet (50 mg total) by mouth every 6 (six) hours as needed. 01/31/15   Deneise Lever, MD   BP 102/66 mmHg  Pulse 68  Temp(Src) 98.2 F (36.8 C) (Oral)  Resp 18  Ht 5\' 8"  (1.727 m)  Wt 78.019 kg  BMI 26.16 kg/m2  SpO2 94% Physical Exam  Constitutional: He appears well-developed and well-nourished. No distress.  HENT:  Head: Normocephalic and atraumatic.  Right Ear: Tympanic membrane and ear canal normal.  Left Ear: Tympanic membrane and ear canal normal.  Eyes: Pupils are equal, round, and reactive to light.  Neck: Neck supple. No spinous process tenderness and no muscular tenderness present. Decreased range of motion (not able to touch left ear to shoulder, due to pain.) present.  FROM of neck to the right  Cardiovascular: Normal  rate and regular rhythm.   Pulmonary/Chest: Effort normal and breath sounds normal. He has no decreased breath sounds. He has no wheezes.  Abdominal: Soft.  Musculoskeletal:  No LE swelling  Neurological: He is alert.  Skin: Skin is warm and dry.  Nursing note and vitals reviewed.   ED Course  Procedures (including critical care time) Labs Review Labs Reviewed  CBC WITH DIFFERENTIAL/PLATELET - Abnormal; Notable for the following:    WBC 10.7 (*)    Neutro Abs 8.8 (*)    All other components within normal limits  BASIC METABOLIC PANEL - Abnormal; Notable for the following:    Glucose, Bld 104 (*)  All other components within normal limits  TROPONIN I    Imaging Review Dg Chest 2 View  02/10/2015  CLINICAL DATA:  Chest pain EXAM: CHEST  2 VIEW COMPARISON:  01/31/2015 chest radiograph. FINDINGS: Stable cardiomediastinal silhouette with normal heart size. No pneumothorax. No appreciable pleural effusion. Stable chronic mild pleural-parenchymal scarring at the left costophrenic angle. Small nodular density overlying the right posterior fifth rib is stable since 2012, in keeping with a benign etiology, probably a bone island within the rib. No pulmonary edema. No focal lung consolidation. The appearance of the lungs is stable since 05/30/2010. IMPRESSION: No active cardiopulmonary disease. Stable mild pleural-parenchymal scarring at the left costophrenic angle. Electronically Signed   By: Ilona Sorrel M.D.   On: 02/10/2015 15:11   I have personally reviewed and evaluated these images and lab results as part of my medical decision-making.   EKG Interpretation None      MDM   Final diagnoses:  Neck pain    CBC, BMP and troponin are unremarkable. Chest x-ray is also reassuring. Discussed the case with Dr. Dolly Rias feels that the patient's symptoms are most likely musculoskeletal. I discussed with patient and family member that CT angiogram of the neck be done to further evaluate  possible vascular abnormality, they declined at this time and would prefer to do conservative treatment and watchful waiting. He plans to follow-up with his primary care doctor on Monday and agrees to return to the emergency department if his pain returns or worsens. At this point is not having any pain and is able to freely move his neck...  Medications - No data to display   I feel the patient has had an appropriate workup for their chief complaint at this time and likelihood of emergent condition existing is low. Discussed s/sx that warrant return to the ED.  Filed Vitals:   02/10/15 1259 02/10/15 1504  BP: 117/87 102/66  Pulse: 74 68  Temp: 98.2 F (36.8 C)   Resp: 18 [id number] PA-C 02/10/15 1606  Pattricia Boss, MD 02/11/15 1626

## 2015-02-15 ENCOUNTER — Other Ambulatory Visit (HOSPITAL_COMMUNITY): Payer: Self-pay | Admitting: Internal Medicine

## 2015-02-21 ENCOUNTER — Encounter (HOSPITAL_COMMUNITY): Payer: Self-pay

## 2015-02-21 ENCOUNTER — Encounter (HOSPITAL_COMMUNITY)
Admission: RE | Admit: 2015-02-21 | Discharge: 2015-02-21 | Disposition: A | Discharge status: Home / Self Care | Payer: 59 | Source: Ambulatory Visit | Attending: Internal Medicine | Admitting: Internal Medicine

## 2015-02-21 DIAGNOSIS — N289 Disorder of kidney and ureter, unspecified: Secondary | ICD-10-CM | POA: Insufficient documentation

## 2015-02-21 LAB — ACTH STIMULATION, 3 TIME POINTS
CORTISOL 30 MIN: 3.5 ug/dL
Cortisol, 60 Min: 4.2 ug/dL
Cortisol, Base: 2 ug/dL

## 2015-02-21 MED ORDER — COSYNTROPIN 0.25 MG IJ SOLR
0.2500 mg | Freq: Once | INTRAMUSCULAR | Status: AC
  Administered 2015-02-21: 0.25 mg via INTRAVENOUS
  Filled 2015-02-21: qty 0.25

## 2015-02-21 NOTE — Procedures (Addendum)
Iv #20 started to rt AC, baseline blood drawn at [facility].  Cortisol iv push given at 0837.  30 min. Blood draw done at 0910 and 81min. Blood draw done at 0940.  Drew labs from saline lock, 65ml waste drawn each time and discarded prior to specimen being obtained.  Pt tolerated well, iv d/c and pressure dessing applied when complete. Offered pt something to drink at end of procedure after all blood obtained but pt declined.  Pt d/c ambulatory with wife to lobby.

## 2015-02-21 NOTE — Discharge Instructions (Signed)
ACTH Stimulation Test WHY AM I HAVING THIS TEST? The adrenocorticotropic hormone (ACTH) stimulation test indirectly shows how well your adrenal glands are working. ACTH is a hormone that is produced by a gland in your brain called the pituitary gland. ACTH stimulates your two adrenal glands, which are located above each kidney. The adrenal glands produce hormones that are released into the blood. One of these hormones is cortisol. Cortisol helps your body to respond to stress. If your adrenal glands are not responding to [place] properly, you may have too much or too little cortisol. WHAT KIND OF SAMPLE IS TAKEN? Two or more blood samples are required for this test. Blood samples are usually collected by inserting a needle into a vein. Cortisol will be measured in the first blood sample to provide a baseline level. After the first blood sample has been collected, you will be given cosyntropin. Cosyntropin is similar to ACTH and should cause the adrenal glands to release cortisol. Cosyntropin is usually given through an IV tube. It could also be given as an intramuscular (IM) injection. At specified intervals after receiving the cosyntropin, you will have one or more blood samples taken to measure your cortisol levels. The test results will be compared to show the amount of cortisol in your blood before and after you were given cosyntropin. HOW DO I PREPARE FOR THE TEST? Do not eat or drink anything after midnight on the night before the test or as directed by your health care provider. WHAT ARE THE REFERENCE VALUES? Reference values are considered healthy values established after testing a large group of healthy people. Reference values may vary among different people, labs, and hospitals. It is your responsibility to obtain your test results. Ask the lab or department performing the test when and how you will get your results. The following are reference values for the various ACTH stimulation  tests:  Rapid test: cortisol levels increase greater than 7 mg/dL above baseline.  24-hour test: cortisol levels greater than 40 mcg/dL.  3-day test: cortisol levels greater than 40 mcg/dL. WHAT DO THE RESULTS MEAN? Results outside of the reference value may indicate:  Cushing syndrome.  Adrenal insufficiency. Talk with your health care provider to discuss your results, treatment options, and if necessary, the need for more tests. Talk with your health care provider if you have any questions about your results.   This information is not intended to replace advice given to you by your health care provider. Make sure you discuss any questions you have with your health care provider.   Document Released: 03/29/2010 Document Revised: 03/17/2014 Document Reviewed: 10/05/2013 Elsevier Interactive Patient Education [employer].

## 2015-02-23 ENCOUNTER — Telehealth: Payer: Self-pay | Admitting: Internal Medicine

## 2015-02-23 DIAGNOSIS — E2740 Unspecified adrenocortical insufficiency: Secondary | ICD-10-CM

## 2015-02-23 NOTE — Telephone Encounter (Signed)
Spoke with pt's daughter, requesting ACTH test results from Wednesday. CY please advise.  Thanks!

## 2015-02-23 NOTE — Telephone Encounter (Signed)
Results have been explained to patient, pt expressed understanding. Nothing further needed.  Notes Recorded by Deneise Lever, MD on 02/23/2015 at 3:50 PM ACTH stimulation test. Adrenal gland didn't respond at all, indicating adrenal insufficiency. Recommend referral to Endocrinology - Drs Barrington Ellison, et al

## 2015-02-23 NOTE — Telephone Encounter (Signed)
I have responded in "Results". Dx adrenal insufficiency. Recommended referral to Endocrinology.

## 2015-03-15 ENCOUNTER — Ambulatory Visit: Payer: 59 | Admitting: Endocrinology

## 2015-03-21 ENCOUNTER — Other Ambulatory Visit: Payer: Self-pay | Admitting: Internal Medicine

## 2015-03-30 ENCOUNTER — Ambulatory Visit (INDEPENDENT_AMBULATORY_CARE_PROVIDER_SITE_OTHER): Payer: 59 | Admitting: Internal Medicine

## 2015-03-30 ENCOUNTER — Encounter: Payer: Self-pay | Admitting: Internal Medicine

## 2015-03-30 VITALS — BP 112/80 | HR 79 | Ht 68.0 in | Wt 169.8 lb

## 2015-03-30 DIAGNOSIS — J449 Chronic obstructive pulmonary disease, unspecified: Secondary | ICD-10-CM | POA: Diagnosis not present

## 2015-03-30 DIAGNOSIS — J3089 Other allergic rhinitis: Secondary | ICD-10-CM

## 2015-03-30 DIAGNOSIS — J309 Allergic rhinitis, unspecified: Secondary | ICD-10-CM | POA: Diagnosis not present

## 2015-03-30 DIAGNOSIS — J45909 Unspecified asthma, uncomplicated: Secondary | ICD-10-CM | POA: Diagnosis not present

## 2015-03-30 DIAGNOSIS — J302 Other seasonal allergic rhinitis: Secondary | ICD-10-CM

## 2015-03-30 DIAGNOSIS — E279 Disorder of adrenal gland, unspecified: Secondary | ICD-10-CM

## 2015-03-30 MED ORDER — IPRATROPIUM BROMIDE 0.02 % IN SOLN: 500.0000 ug | RESPIRATORY_TRACT | Status: DC | PRN

## 2015-03-30 MED ORDER — ALBUTEROL SULFATE (2.5 MG/3ML) 0.083% IN NEBU: INHALATION_SOLUTION | RESPIRATORY_TRACT | Status: DC

## 2015-03-30 MED ORDER — ALBUTEROL SULFATE HFA 108 (90 BASE) MCG/ACT IN AERS: INHALATION_SPRAY | RESPIRATORY_TRACT | Status: DC

## 2015-03-30 NOTE — Progress Notes (Signed)
Patient ID: AUM CAGGIANO, male    DOB: [birth year], 60 y.o.   MRN: [id number]  HPI 4/ 3/12- 44 yo never smoker with hx chronic obstructive asthma, now on Xolair. Xolair is being dosed 150 mg per month, but he feels it wearing off by 3 weeks and asks about a dosing change. He remains on prednisone 5 mg daily. At 3 weeks after a xolair dose he begins to feel tighter, more wheeze. He had to miss work today because of asthma and he increased prednisone today to 20 mg. . He denies obvious change with seasonal pollens- no itch, sneeze,watery eyes.   10/24/10- 75 yo never smoker with hx chronic obstructive asthma, now on Xolair. Has continued Xoalir monthly. One injection/ month lasted few weeks-not a month. Increased to 2 injections/ month, same total dose,  and not as clearly helping. Has continued chronic maintenance prednisone. We discussed allergic vs nonallergic triggers. He is watching what happens as we enter Fall season. He had to miss work due to asthma flare 2 days ago and was aggresively using neb. Trigger was lawnmower smoke in garage.  Today is great. Currently on prednisone 20 mg daily. He remembers bone density check by Dr Lavone Orn within last few years.  We discussed Daliresp and he is interested in giving it another try.   04/18/11- 27 yo never smoker with hx chronic obstructive asthma, now on Xolair. He has been trying to get to lower maintenance prednisone dose and asked for higher Xolair dose. We increased him 300-> Xolair 375 mg  monthly.    Wife here  FOLLOWS FOR: been out of work for 3 days as of today-doesnt want to go up past prednisone 15mg  (was on 30mg ),using Zyflo. Wheezing increased-getting up in middle of night to do breathing tx's.   He was on prednisone 40 mg daily for several weeks before trying to drop down. Using nebulizer at night. Concerned about increased body fat from prednisone. We have had many long conversations about steroid side effects and alternatives over  the years. Theophylline did work when he was in college. Office spirometry: Severe obstructive airways disease. FEV1 1.29/35%, FEV1/FEC 0.46.  06/23/11- 56 yo never smoker with hx chronic obstructive asthma, now on Xolair. We had referred him to do, with the expectation he might be considered for thermal bronchoscopy to treat his asthma. They're treating him for reflux with Prilosec twice daily. He feels a Xolair 375 mg is helping after 2 doses, the way the first few doses did at 275 mg. He was just able to reduce his prednisone to 5 mg daily with mild residual wheeze. We reviewed findings associated with steroid withdrawal and adrenal insufficiency and discussed endocrinology referral to help with this issue.  08/20/11- 53 yo never smoker with hx chronic obstructive asthma, now on Xolair. Pt c/o increased SOB, chest tightness and prod cough x 1 week  Pt tx with Pred 30 mg x1 week then increased to 50mg  qd x 2days. Pt denies f/c/s.  Pt states even with the prednisone he cannot get relief. He has been on 30 mg of prednisone for most of this spring on a daily basis, usually with some wheeze but able to work. In the last day or 2 chest tightness and wheeze have been worse without specific reason. He had to increase prednisone to 50 mg daily. He continues Xolair injections. No recent colds or obvious exposures. Scant phlegm with trace yellow. He denies sinus problems. Taking Prilosec twice daily as  directed by the pulmonologist we sent him to at [facility]. We sent him specifically for evaluation of thermoplasty. He apparently is a little outside of their selection criteria but they're considering him. He has missed work intermittently. There is no part-time work available but he feels unable to do a 40 hour work week anymore  09/17/11- 82 yo never smoker with hx chronic obstructive asthma, now on Xolair.  Pt states he has notices a lot of improvement while on the Pred taper (now on 10mg ). Pt c/o slight wheezing and  sob, but not too concerned. Pt denies chest tightness, prod cough.  Discussed his work with the pulmonologist at [facility]. They emphasize management of his GERD, demonstrated with barium swallow. They plan PFTs at his next visit so we won't do them here. He has been out of work now for 3 months. 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 He continues Brovana and Atrovent nebulizers twice daily with rare need now for rescue inhaler. He did like Arcapta and we compared that to his nebulizer. He failed Singulair and Zyflo.  11/07/11- 61 yo never smoker with hx chronic obstructive asthma, ended Xolair June, 2013.    Wife here. Still having increased SOB-currently on 8mg  Prednisone-stilll out of work. Wheezing as well and cough. Recent exacer- improved after doxy. Has been able to maintain on prednisone 8 mg daily but no lower. [facility] said not good candidate for thermoplasty. Short term disability through Sept 11.  Long discussion of options, meds. Will try Brovana neb in AM/ Arcapta in PM.   12/10/11- 61 yo never smoker with hx chronic obstructive asthma, ended Xolair June, 2013.    Wife here. Still having SOB and wheezing at times; still out of work. Can tolerate light to moderate exertion on some days, but on other days wheezing dyspnea is too limiting. He is pleased to be would remain on only 8 mg daily prednisone now for 2 months at that dose. We have carefully discussed long-term steroid side effects. He asks extension of leave of absence one more month and has applied for total and permanent disability. Now out of work x3 months. No longer being followed at [facility] pulmonary. COPD assessment test (CAT) score 22/40.  01/07/12- 47 yo never smoker with hx chronic obstructive asthma, ended Xolair June, 2013.  Breathing no better but no worse than last time He considers that a good thing that he has been able to hold prednisone maintenance at 8 milligrams  daily. Exertional dyspnea easy fatigue little cough no chest pain or fever. He talks about the possibility of going back to work but it doesn't seem realistic. We agreed to extend his short term disability. CXR 12/30/11-    IMPRESSION:  1. Stable cardiomegaly with now mild pulmonary vascular  congestion.  2. Stable scarring at the lung apices bilaterally.  Original Report Authenticated By: Resa Miner. MATTERN, M.D.   04/08/12- 52 yo never smoker with hx chronic obstructive asthma, ended Xolair June, 2013.  FOLLOWS FOR: wheezing; has gotten worse in past 2-3 weeks even after albuterol and atrovent this morning. Acute exacerbation x 2-3 weeks-? Weather, but has not had a cold. Using all meds, incl used neb/ Duoneb 2 hours before arival this AM.  Remains out of work appropriately since June, 2013. Chronic asthma with COPD, steroid dependent trying not to go over 8 mg prednisone daily. Baseline daily wheeze and dyspnea, with occasional exacerbation. We have been able to keep him out  of hospital because he is good at self-management.  Failed Daliresp and Xolair. Several second-opinion visits to [facility] in 2013. They did not feel he was thermoplasty candidate.  Office spirometry 04/08/12- severe obstructive airways disease- FVC 2.64/58%, FEV1 1.30/36%, FEV1/FVC 0.49, FEF 25-75% 0.50/14%.  06/17/12- 48 yo never smoker with hx chronic obstructive asthma, ended Xolair June, 2013. ACUTE VISIT: chest tightness-feels more tightness in left side of chest, increased wheezing and SOB as well. Coughing-forces cough to bring mucus up. Worse asthma x2 weeks without fever, sore throat, itching or sneezing area and started herself back on Singulair. He has been on 8 mg of Medrol daily which is as low as he has been able to get in a long time. Using his nebulizer but seemed bring up scant clear mucus. He has not been taking Proventil tablets and has not tried the sample of Breo Ellipta.  11/03/12- 69 yo never  smoker with hx chronic obstructive asthma, ended Xolair June, 2013. GERD FOLLOWS FOR: breathing not the best; SOB and wheezing, chest congestion, cough -tightness in chest at times(able to get phlegm up after breathing tx's sometimes) Daily wheeze, cough, short of breath at baseline. Nebulizer produces clear sputum. Now has disability. Continues prednisone 10 mg daily. Occasionally aware of throat burning reflux despite Prilosec twice a day. CXR 06/17/12 IMPRESSION:  No acute finding.  Original Report Authenticated By: Orlean Patten, M.D.  03/16/13- 8 yo never smoker with hx chronic obstructive asthma, ended Xolair June, 2013. GERD FOLLOWS WUJ:WJXBJ like he is on the "fence" with his breathing being on Pred 10 mg and Dulera daily; unsure if he needs a shot of depo; can hear him wheezing(he states this happens all day long) Levaquin worked well. Now back on prednisone maintenance 10 mg daily plus Dulera but wheezing every day.h eartburn controlled. CXR 06/17/12 IMPRESSION:  No acute finding.  Original Report Authenticated By: Orlean Patten, M.D.  07/21/13- 74 yo never smoker with hx chronic obstructive asthma, ended Xolair June, 2013. GERD FOLLOWS FOR: P states his breathing is okay considering the weather and pollen. Head congestion, sinus pressure. He coughs some every day and uses nebulizer and rescue inhaler a couple of times per day to say and control. He expects seasonal flare with ragweed in the fall.  914/15- 32 yo never smoker with hx chronic obstructive asthma, ended Xolair June, 2013. GERD FOLLOWS FOR: occasional nonprod cough with clear mucus, SOB with exertion.  States he is feeling well.  On prednisone 10 mg daily for the last month, which he considers good. Home oximetry running around 92% on room air. Ruthe Mannan seems to work better as a maintenance inhaler. Admits occasional depression despite Lexapro, which he will discuss with his primary physician.  03/23/14-  38 yo never  smoker with hx chronic obstructive asthma, ended Xolair June, 2013. GERD FOLLOWS FOR:had flare up of Asthma late December and was given Doxycycline Rx with refill-had to take additional refill due to symptoms coming back-currently on it. Symptoms have stopped again but unsure if will come back. Has 2 more days of the current doxycycline prescription. Now taking prednisone 20 mg daily with goal of getting back to 10 mg daily. Wife and daughter are both sick now.  09/27/14- 87 yo never smoker with hx chronic obstructive asthma, ended Xolair June, 2013. GERD FOLLOWS FOR: Pt states his breathing is "terrible"; had to go on Doxy Rx as well as Pred 40mg  dose. Currently on Pred 25 mg QD tapering down.  Worse SOB, chest  tightness x 6 weeks. No clear cause- ?weather. No nasal congestion. Started doxy - finished 1 week ago. Prednisone burst to 40 mg, now at 25 mg.   01/31/2015-60 year old male never smoker followed for chronic obstructive asthma, ended Xolair 0000000, complicated by GERD Acute: Follows For: pt states he is having sharp upper left chest pains thats appeared within the last 48 hours and has had a fever. pt states he always has SOB but his breathing is under control. pt had STAT CXR done today. He had recently requested adrenal gland function assessment and a cortisone stimulation test is scheduled Onset of fever and myalgias one week ago. Sharp left anterior chest pain began 48 hours ago. Low back pain present about 2 weeks. Fever now gone. No change in chronic cough with slight yellow sputum. Denies sore throat or rash. Continuing prednisone 10 mg daily. He was trying 9 mg but ran out of 1 mg tabs. Began Avelox prescribed yesterday and has Diflucan for question of thrush. No discomfort in legs. CXR 01/31/2015-image reviewed with him and his wife looks stable with chronic asymmetry, increased vascular perfusion right lung relative to left and scoliosis IMPRESSION: 1. No active lung disease. Cannot  exclude tiny left pleural effusion. 2. Peribronchial thickening may indicate bronchitis. Electronically Signed  By: Ivar Drape M.D.  On: 01/31/2015 09:18  03/30/2015-60 year old male never smoker followed for chronic obstructive asthma, ended Xolair 08/28/2011, hx RML resection age 77, chronic sinus disease, complicated by GERD Follows For: Still having occ sob and wheezing. denies any cp/tightness. No recent acute events. He feels near his baseline with nonproductive rattling cough. Prednisone at 10 mg daily. ACTH stimulation test consistent with adrenal insufficiency. He understands significance and wants to make his own endocrine referral. Maintenance prednisone suppresses eosinophil count so we can't demonstrate qualification for a trial of Nucala therapy We reviewed and compared chest x-rays showing chronic increased markings right lung and mild scoliosis to right. CXR 02/10/2015- IMPRESSION: No active cardiopulmonary disease. Stable mild pleural-parenchymal scarring at the left costophrenic angle. Electronically Signed  By: Ilona Sorrel M.D.  On: 02/10/2015 15:11  Review of Systems-See HPI Constitutional:   No weight loss, night sweats,  Fevers, chills, fatigue, lassitude. HEENT:   No headaches,  Difficulty swallowing,  Tooth/dental problems,  Sore throat,                No sneezing, itching, ear ache, No-nasal congestion, post nasal drip,  CV:  + chest pain, orthopnea, PND, swelling in lower extremities, anasarca, dizziness, palpitations GI  +heartburn,+indigestion, no-abdominal pain, nausea, vomiting, Resp:   No excess mucus, no-productive cough,  + non-productive cough,  No coughing up of blood.  No change in color of mucus.  + wheezing. +Short of breath mainly w/ exertion.  Skin: no rash or lesions. GU:  MS:  No joint pain or swelling. Marland Kitchen Psych:  No change in mood or affect. No depression or anxiety.  No memory loss.  Objective:   Physical Exam General- Alert,  Oriented, Affect-appropriate, Distress- none acute,  Looks well Skin- rash-none, lesions- none, excoriation- none,  Lymphadenopathy- none Head- atraumatic            Eyes- Gross vision intact, PERRLA, conjunctivae- clear secretions            Ears- Hearing, canals normal            Nose- + mild stuffy, no-Septal dev,  polyps, erosion, perforation  Throat- Mallampati II , mucosa clear , drainage- none, tonsils- atrophic.  Neck- flexible , trachea midline, no stridor , thyroid nl, carotid no bruit Chest - symmetrical excursion , unlabored           Heart/CV- RRR , no murmur , no gallop  , no rub, nl s1 s2                           - JVD- none , edema- none, stasis changes- none, varices- none           Lung-    Wheeze+  bilateral, unlabored , able to speak full sentences.  Cough-none,                                             dullness-none, rub- none.            Chest wall-  Abd- Br/ Gen/ Rectal- Not done, not indicated Extrem- cyanosis- none, clubbing, none, atrophy- none, strength- nl Neuro- grossly intact to observation

## 2015-03-30 NOTE — Assessment & Plan Note (Addendum)
Endocrine referral was made but he prefers to make his own. He understands significance. Chronic steroid therapy has been required for airway control.

## 2015-03-30 NOTE — Assessment & Plan Note (Signed)
I would like to try Nucala but we can't qualify him because prednisone masks any eosinophilia

## 2015-03-30 NOTE — Assessment & Plan Note (Signed)
Have not been seeing polyps. Baseline nasal stuffiness without acute exacerbation

## 2015-03-30 NOTE — Patient Instructions (Addendum)
We talked about giving another Pneumovax-23 if you haven't had one since 2009. You can check records at [facility] since you aren't sure what you have had.  It would be good to see Endocrinologist about adrenal insufficiency, even if we can't get you off   Then arbitrarily, suggest planning to give the Prevnar-13 pneumonia vaccine at age 60 or so  Refills sent to [facility]

## 2015-04-13 ENCOUNTER — Telehealth: Payer: Self-pay | Admitting: *Deleted

## 2015-04-13 NOTE — Telephone Encounter (Signed)
Submitted PA for Ipratropium thru [facility]. [id number] Pt [id number]  Sent for review.

## 2015-04-19 MED ORDER — IPRATROPIUM BROMIDE 0.02 % IN SOLN: 500.0000 ug | Freq: Four times a day (QID) | RESPIRATORY_TRACT | Status: DC

## 2015-04-19 NOTE — Telephone Encounter (Signed)
Called [facility] [phone]) and was informed the PA was denied for medical necessity. [facility] states only 4 vials per day are allowed. Rx was sent in as 635ml.

## 2015-04-19 NOTE — Telephone Encounter (Signed)
Rx sent to [facility]. Pt aware of below, is in agreement with plan, and voiced no further questions or concerns at this time.

## 2015-04-19 NOTE — Telephone Encounter (Signed)
Change ipratropium neb solution order to # 360 ampules/ 90 days    1 every 6 hours, refill x 3

## 2015-05-04 ENCOUNTER — Other Ambulatory Visit: Payer: Self-pay | Admitting: Internal Medicine

## 2015-05-05 ENCOUNTER — Other Ambulatory Visit: Payer: Self-pay | Admitting: Internal Medicine

## 2015-05-29 ENCOUNTER — Telehealth: Payer: Self-pay | Admitting: Internal Medicine

## 2015-05-29 NOTE — Telephone Encounter (Signed)
Spoke with pt, aware of recs.  Will pick up sample of incruse and see how this does.  Sample placed up front for pt.  Nothing further needed.

## 2015-05-29 NOTE — Telephone Encounter (Signed)
We can try sample Incruse   Inhale 1 puff, once daily.

## 2015-05-29 NOTE — Telephone Encounter (Signed)
Spoke with pt, wants to know if there is another inhaler that can be added to pt's maintenance regimen to help get through spring allergy season.  Pt currently taking dulera 2 puffs bid, albuterol  hfa prn, and prednisone 10mg  qd.  Pt not currently having any acute symptoms but wants to be proactive with his regimen.   Pt uses [facility] on [address].    Last ov: 03/30/15 Next ov: 09/27/15  CY please advise on recs.  Thanks!

## 2015-06-22 ENCOUNTER — Other Ambulatory Visit: Payer: Self-pay | Admitting: Internal Medicine

## 2015-07-02 ENCOUNTER — Other Ambulatory Visit: Payer: Self-pay | Admitting: Internal Medicine

## 2015-07-02 ENCOUNTER — Ambulatory Visit
Admission: RE | Admit: 2015-07-02 | Discharge: 2015-07-02 | Disposition: A | Discharge status: Home / Self Care | Payer: 59 | Source: Ambulatory Visit | Attending: Internal Medicine | Admitting: Internal Medicine

## 2015-07-02 DIAGNOSIS — S22080A Wedge compression fracture of T11-T12 vertebra, initial encounter for closed fracture: Secondary | ICD-10-CM

## 2015-07-02 DIAGNOSIS — M545 Low back pain: Secondary | ICD-10-CM

## 2015-07-02 DIAGNOSIS — M8580 Other specified disorders of bone density and structure, unspecified site: Secondary | ICD-10-CM

## 2015-07-20 ENCOUNTER — Telehealth: Payer: Self-pay | Admitting: Internal Medicine

## 2015-07-20 NOTE — Telephone Encounter (Signed)
I need to know if patient's wife would like to pick up originals for her records or have [place] mail them to her. I have placed our copy in scanning folder.

## 2015-07-20 NOTE — Telephone Encounter (Signed)
Patient wife called back and states she would like FMLA papers mailed to the home -prm

## 2015-07-24 NOTE — Telephone Encounter (Signed)
Noted and I have mailed originals to patient and wife. Nothing further needed at this time.

## 2015-08-28 ENCOUNTER — Telehealth: Payer: Self-pay | Admitting: Internal Medicine

## 2015-08-28 NOTE — Telephone Encounter (Signed)
Spoke with pt, states he went to an endocrinologist at [facility] to help with tapering down on his prednisone- pt has been down to 7mg  qd X6 weeks now.  Per pt, his endocrinologist is now recommending that pt go on hydrocortisone TID to help with further prednisone tapering.  Pt is wanting to make sure CY is ok with this and that it won't interfere with his asthma.  Last ov: 03/30/15 Next ov: 09/27/15  Allergies  Allergen Reactions  . Erythromycin Ethylsuccinate     REACTION: unspecified  . Penicillins    Current Outpatient Prescriptions on File Prior to Visit  Medication Sig Dispense Refill  . albuterol (PROVENTIL) (2.5 MG/3ML) 0.083% nebulizer solution 1 neb 3-4 times daily 1500 mL 4  . albuterol (VENTOLIN HFA) 108 (90 Base) MCG/ACT inhaler 2 puffs four times daily as needed 3 Inhaler 4  . Calcium Carbonate-Vitamin D (CALCIUM 600+D) 600-200 MG-UNIT TABS Take 1 tablet by mouth 2 (two) times daily.      . diazepam (VALIUM) 5 MG tablet Take 1 tablet (5 mg total) by mouth 2 (two) times daily. 10 tablet 0  . DULERA 200-5 MCG/ACT AERO Use 2 puffs twice daily 39 g 1  . fluticasone (FLONASE) 50 MCG/ACT nasal spray Use 1 to 2 sprays in each  nostril daily at bedtime as needed 48 g 1  . ipratropium (ATROVENT) 0.02 % nebulizer solution Take 2.5 mLs (500 mcg total) by nebulization every 6 (six) hours. Dx. Code J44.9 900 mL 3  . Magnesium Cl-Calcium Carbonate (SLOW-MAG PO) Take 2 tablets by mouth daily.    . montelukast (SINGULAIR) 10 MG tablet Take 1 tablet by mouth at  bedtime 90 tablet 1  . omeprazole (PRILOSEC) 20 MG capsule Take 1 capsule (20 mg total) by mouth 2 (two) times daily. 180 capsule 0  . predniSONE (DELTASONE) 10 MG tablet Take 1 tablet (10 mg total) by mouth daily. 30 tablet 5  . traMADol (ULTRAM) 50 MG tablet Take 1 tablet (50 mg total) by mouth every 6 (six) hours as needed. 40 tablet 0   No current facility-administered medications on file prior to visit.

## 2015-08-29 NOTE — Telephone Encounter (Signed)
Spoke with pt,aware of recs.  Nothing further needed.  

## 2015-08-29 NOTE — Telephone Encounter (Signed)
Fine to make that change to hydrocortisone. We will deal with asthma if we have to .

## 2015-09-04 ENCOUNTER — Telehealth: Payer: Self-pay | Admitting: Internal Medicine

## 2015-09-04 NOTE — Telephone Encounter (Signed)
Called [phone] [facility] and spoke with Jeani Hawking. She states that the [facility] is not in the patient's formulary and that the pt will need to contact his insurance in order to find alternative.  LVM for pt to return call.

## 2015-09-05 NOTE — Telephone Encounter (Signed)
Called and spoke with pt's wife. She is aware that they will contact pt's insurance for formulary. They will call us back with this information. She asks that we leave samples of Dulera at the front for pick up. Nothing further was needed.

## 2015-09-07 ENCOUNTER — Ambulatory Visit: Payer: 59 | Admitting: Family Medicine

## 2015-09-18 ENCOUNTER — Telehealth: Payer: Self-pay | Admitting: Internal Medicine

## 2015-09-18 NOTE — Telephone Encounter (Signed)
Spoke with pt, states he had to cancel his rov on 09/27/15 d/t a scheduling conflict- this was a routine 42m rov.  This has been rescheduled.  Nothing further needed.

## 2015-09-20 ENCOUNTER — Ambulatory Visit (INDEPENDENT_AMBULATORY_CARE_PROVIDER_SITE_OTHER): Payer: 59 | Admitting: Internal Medicine

## 2015-09-20 ENCOUNTER — Encounter: Payer: Self-pay | Admitting: Internal Medicine

## 2015-09-20 VITALS — BP 90/60 | HR 92 | Ht 68.0 in | Wt 154.6 lb

## 2015-09-20 DIAGNOSIS — F418 Other specified anxiety disorders: Secondary | ICD-10-CM

## 2015-09-20 DIAGNOSIS — G2581 Restless legs syndrome: Secondary | ICD-10-CM | POA: Diagnosis not present

## 2015-09-20 DIAGNOSIS — J449 Chronic obstructive pulmonary disease, unspecified: Secondary | ICD-10-CM

## 2015-09-20 DIAGNOSIS — J45909 Unspecified asthma, uncomplicated: Secondary | ICD-10-CM

## 2015-09-20 DIAGNOSIS — J209 Acute bronchitis, unspecified: Secondary | ICD-10-CM

## 2015-09-20 DIAGNOSIS — F329 Major depressive disorder, single episode, unspecified: Secondary | ICD-10-CM

## 2015-09-20 DIAGNOSIS — F419 Anxiety disorder, unspecified: Secondary | ICD-10-CM

## 2015-09-20 MED ORDER — DOXYCYCLINE HYCLATE 100 MG PO TABS: 100.0000 mg | ORAL_TABLET | Freq: Two times a day (BID) | ORAL | Status: DC

## 2015-09-20 MED ORDER — OMEPRAZOLE 20 MG PO CPDR: 20.0000 mg | DELAYED_RELEASE_CAPSULE | Freq: Two times a day (BID) | ORAL | Status: DC

## 2015-09-20 MED ORDER — BUDESONIDE-FORMOTEROL FUMARATE 160-4.5 MCG/ACT IN AERO: INHALATION_SPRAY | RESPIRATORY_TRACT | Status: DC

## 2015-09-20 MED ORDER — ROPINIROLE HCL 0.25 MG PO TABS: ORAL_TABLET | ORAL | Status: DC

## 2015-09-20 MED ORDER — COMPRESSOR NEBULIZER MISC: 1.0000 | Freq: Once | Status: AC

## 2015-09-20 MED ORDER — PREDNISONE 10 MG PO TABS: 10.0000 mg | ORAL_TABLET | Freq: Every day | ORAL | Status: DC

## 2015-09-20 MED ORDER — ALPRAZOLAM 0.5 MG PO TABS: ORAL_TABLET | ORAL | Status: DC

## 2015-09-20 NOTE — Progress Notes (Signed)
Patient ID: AUM CAGGIANO, male    DOB: [birth year], 60 y.o.   MRN: [id number]  HPI 4/ 3/12- 44 yo never smoker with hx chronic obstructive asthma, now on Xolair. Xolair is being dosed 150 mg per month, but he feels it wearing off by 3 weeks and asks about a dosing change. He remains on prednisone 5 mg daily. At 3 weeks after a xolair dose he begins to feel tighter, more wheeze. He had to miss work today because of asthma and he increased prednisone today to 20 mg. . He denies obvious change with seasonal pollens- no itch, sneeze,watery eyes.   10/24/10- 75 yo never smoker with hx chronic obstructive asthma, now on Xolair. Has continued Xoalir monthly. One injection/ month lasted few weeks-not a month. Increased to 2 injections/ month, same total dose,  and not as clearly helping. Has continued chronic maintenance prednisone. We discussed allergic vs nonallergic triggers. He is watching what happens as we enter Fall season. He had to miss work due to asthma flare 2 days ago and was aggresively using neb. Trigger was lawnmower smoke in garage.  Today is great. Currently on prednisone 20 mg daily. He remembers bone density check by Dr Lavone Orn within last few years.  We discussed Daliresp and he is interested in giving it another try.   04/18/11- 27 yo never smoker with hx chronic obstructive asthma, now on Xolair. He has been trying to get to lower maintenance prednisone dose and asked for higher Xolair dose. We increased him 300-> Xolair 375 mg  monthly.    Wife here  FOLLOWS FOR: been out of work for 3 days as of today-doesnt want to go up past prednisone 15mg  (was on 30mg ),using Zyflo. Wheezing increased-getting up in middle of night to do breathing tx's.   He was on prednisone 40 mg daily for several weeks before trying to drop down. Using nebulizer at night. Concerned about increased body fat from prednisone. We have had many long conversations about steroid side effects and alternatives over  the years. Theophylline did work when he was in college. Office spirometry: Severe obstructive airways disease. FEV1 1.29/35%, FEV1/FEC 0.46.  06/23/11- 56 yo never smoker with hx chronic obstructive asthma, now on Xolair. We had referred him to do, with the expectation he might be considered for thermal bronchoscopy to treat his asthma. They're treating him for reflux with Prilosec twice daily. He feels a Xolair 375 mg is helping after 2 doses, the way the first few doses did at 275 mg. He was just able to reduce his prednisone to 5 mg daily with mild residual wheeze. We reviewed findings associated with steroid withdrawal and adrenal insufficiency and discussed endocrinology referral to help with this issue.  08/20/11- 53 yo never smoker with hx chronic obstructive asthma, now on Xolair. Pt c/o increased SOB, chest tightness and prod cough x 1 week  Pt tx with Pred 30 mg x1 week then increased to 50mg  qd x 2days. Pt denies f/c/s.  Pt states even with the prednisone he cannot get relief. He has been on 30 mg of prednisone for most of this spring on a daily basis, usually with some wheeze but able to work. In the last day or 2 chest tightness and wheeze have been worse without specific reason. He had to increase prednisone to 50 mg daily. He continues Xolair injections. No recent colds or obvious exposures. Scant phlegm with trace yellow. He denies sinus problems. Taking Prilosec twice daily as  directed by the pulmonologist we sent him to at [facility]. We sent him specifically for evaluation of thermoplasty. He apparently is a little outside of their selection criteria but they're considering him. He has missed work intermittently. There is no part-time work available but he feels unable to do a 40 hour work week anymore  09/17/11- 82 yo never smoker with hx chronic obstructive asthma, now on Xolair.  Pt states he has notices a lot of improvement while on the Pred taper (now on 10mg ). Pt c/o slight wheezing and  sob, but not too concerned. Pt denies chest tightness, prod cough.  Discussed his work with the pulmonologist at [facility]. They emphasize management of his GERD, demonstrated with barium swallow. They plan PFTs at his next visit so we won't do them here. He has been out of work now for 3 months. 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 He continues Brovana and Atrovent nebulizers twice daily with rare need now for rescue inhaler. He did like Arcapta and we compared that to his nebulizer. He failed Singulair and Zyflo.  11/07/11- 61 yo never smoker with hx chronic obstructive asthma, ended Xolair June, 2013.    Wife here. Still having increased SOB-currently on 8mg  Prednisone-stilll out of work. Wheezing as well and cough. Recent exacer- improved after doxy. Has been able to maintain on prednisone 8 mg daily but no lower. [facility] said not good candidate for thermoplasty. Short term disability through Sept 11.  Long discussion of options, meds. Will try Brovana neb in AM/ Arcapta in PM.   12/10/11- 61 yo never smoker with hx chronic obstructive asthma, ended Xolair June, 2013.    Wife here. Still having SOB and wheezing at times; still out of work. Can tolerate light to moderate exertion on some days, but on other days wheezing dyspnea is too limiting. He is pleased to be would remain on only 8 mg daily prednisone now for 2 months at that dose. We have carefully discussed long-term steroid side effects. He asks extension of leave of absence one more month and has applied for total and permanent disability. Now out of work x3 months. No longer being followed at [facility] pulmonary. COPD assessment test (CAT) score 22/40.  01/07/12- 47 yo never smoker with hx chronic obstructive asthma, ended Xolair June, 2013.  Breathing no better but no worse than last time He considers that a good thing that he has been able to hold prednisone maintenance at 8 milligrams  daily. Exertional dyspnea easy fatigue little cough no chest pain or fever. He talks about the possibility of going back to work but it doesn't seem realistic. We agreed to extend his short term disability. CXR 12/30/11-    IMPRESSION:  1. Stable cardiomegaly with now mild pulmonary vascular  congestion.  2. Stable scarring at the lung apices bilaterally.  Original Report Authenticated By: Resa Miner. MATTERN, M.D.   04/08/12- 52 yo never smoker with hx chronic obstructive asthma, ended Xolair June, 2013.  FOLLOWS FOR: wheezing; has gotten worse in past 2-3 weeks even after albuterol and atrovent this morning. Acute exacerbation x 2-3 weeks-? Weather, but has not had a cold. Using all meds, incl used neb/ Duoneb 2 hours before arival this AM.  Remains out of work appropriately since June, 2013. Chronic asthma with COPD, steroid dependent trying not to go over 8 mg prednisone daily. Baseline daily wheeze and dyspnea, with occasional exacerbation. We have been able to keep him out  of hospital because he is good at self-management.  Failed Daliresp and Xolair. Several second-opinion visits to [facility] in 2013. They did not feel he was thermoplasty candidate.  Office spirometry 04/08/12- severe obstructive airways disease- FVC 2.64/58%, FEV1 1.30/36%, FEV1/FVC 0.49, FEF 25-75% 0.50/14%.  06/17/12- 48 yo never smoker with hx chronic obstructive asthma, ended Xolair June, 2013. ACUTE VISIT: chest tightness-feels more tightness in left side of chest, increased wheezing and SOB as well. Coughing-forces cough to bring mucus up. Worse asthma x2 weeks without fever, sore throat, itching or sneezing area and started herself back on Singulair. He has been on 8 mg of Medrol daily which is as low as he has been able to get in a long time. Using his nebulizer but seemed bring up scant clear mucus. He has not been taking Proventil tablets and has not tried the sample of Breo Ellipta.  11/03/12- 69 yo never  smoker with hx chronic obstructive asthma, ended Xolair June, 2013. GERD FOLLOWS FOR: breathing not the best; SOB and wheezing, chest congestion, cough -tightness in chest at times(able to get phlegm up after breathing tx's sometimes) Daily wheeze, cough, short of breath at baseline. Nebulizer produces clear sputum. Now has disability. Continues prednisone 10 mg daily. Occasionally aware of throat burning reflux despite Prilosec twice a day. CXR 06/17/12 IMPRESSION:  No acute finding.  Original Report Authenticated By: Orlean Patten, M.D.  03/16/13- 8 yo never smoker with hx chronic obstructive asthma, ended Xolair June, 2013. GERD FOLLOWS WUJ:[id number] like he is on the "fence" with his breathing being on Pred 10 mg and Dulera daily; unsure if he needs a shot of depo; can hear him wheezing(he states this happens all day long) Levaquin worked well. Now back on prednisone maintenance 10 mg daily plus Dulera but wheezing every day.h eartburn controlled. CXR 06/17/12 IMPRESSION:  No acute finding.  Original Report Authenticated By: Orlean Patten, M.D.  07/21/13- 74 yo never smoker with hx chronic obstructive asthma, ended Xolair June, 2013. GERD FOLLOWS FOR: P states his breathing is okay considering the weather and pollen. Head congestion, sinus pressure. He coughs some every day and uses nebulizer and rescue inhaler a couple of times per day to say and control. He expects seasonal flare with ragweed in the fall.  914/15- 32 yo never smoker with hx chronic obstructive asthma, ended Xolair June, 2013. GERD FOLLOWS FOR: occasional nonprod cough with clear mucus, SOB with exertion.  States he is feeling well.  On prednisone 10 mg daily for the last month, which he considers good. Home oximetry running around 92% on room air. Ruthe Mannan seems to work better as a maintenance inhaler. Admits occasional depression despite Lexapro, which he will discuss with his primary physician.  03/23/14-  38 yo never  smoker with hx chronic obstructive asthma, ended Xolair June, 2013. GERD FOLLOWS FOR:had flare up of Asthma late December and was given Doxycycline Rx with refill-had to take additional refill due to symptoms coming back-currently on it. Symptoms have stopped again but unsure if will come back. Has 2 more days of the current doxycycline prescription. Now taking prednisone 20 mg daily with goal of getting back to 10 mg daily. Wife and daughter are both sick now.  09/27/14- 87 yo never smoker with hx chronic obstructive asthma, ended Xolair June, 2013. GERD FOLLOWS FOR: Pt states his breathing is "terrible"; had to go on Doxy Rx as well as Pred 40mg  dose. Currently on Pred 25 mg QD tapering down.  Worse SOB, chest  tightness x 6 weeks. No clear cause- ?weather. No nasal congestion. Started doxy - finished 1 week ago. Prednisone burst to 40 mg, now at 25 mg.   01/31/2015-60 year old male never smoker followed for chronic obstructive asthma, ended Xolair 0000000, complicated by GERD Acute: Follows For: pt states he is having sharp upper left chest pains thats appeared within the last 48 hours and has had a fever. pt states he always has SOB but his breathing is under control. pt had STAT CXR done today. He had recently requested adrenal gland function assessment and a cortisone stimulation test is scheduled Onset of fever and myalgias one week ago. Sharp left anterior chest pain began 48 hours ago. Low back pain present about 2 weeks. Fever now gone. No change in chronic cough with slight yellow sputum. Denies sore throat or rash. Continuing prednisone 10 mg daily. He was trying 9 mg but ran out of 1 mg tabs. Began Avelox prescribed yesterday and has Diflucan for question of thrush. No discomfort in legs. CXR 01/31/2015-image reviewed with him and his wife looks stable with chronic asymmetry, increased vascular perfusion right lung relative to left and scoliosis IMPRESSION: 1. No active lung disease. Cannot  exclude tiny left pleural effusion. 2. Peribronchial thickening may indicate bronchitis. Electronically Signed  By: Ivar Drape M.D.  On: 01/31/2015 09:18  03/30/2015-60 year old male never smoker followed for chronic obstructive asthma, ended Xolair 08/28/2011, hx RML resection age 77, chronic sinus disease, complicated by GERD Follows For: Still having occ sob and wheezing. denies any cp/tightness. No recent acute events. He feels near his baseline with nonproductive rattling cough. Prednisone at 10 mg daily. ACTH stimulation test consistent with adrenal insufficiency. He understands significance and wants to make his own endocrine referral. Maintenance prednisone suppresses eosinophil count so we can't demonstrate qualification for a trial of Nucala therapy We reviewed and compared chest x-rays showing chronic increased markings right lung and mild scoliosis to right. CXR 02/10/2015- IMPRESSION: No active cardiopulmonary disease. Stable mild pleural-parenchymal scarring at the left costophrenic angle. Electronically Signed  By: Ilona Sorrel M.D.  On: 02/10/2015 15:11  Review of Systems-See HPI Constitutional:   No weight loss, night sweats,  Fevers, chills, fatigue, lassitude. HEENT:   No headaches,  Difficulty swallowing,  Tooth/dental problems,  Sore throat,                No sneezing, itching, ear ache, No-nasal congestion, post nasal drip,  CV:  + chest pain, orthopnea, PND, swelling in lower extremities, anasarca, dizziness, palpitations GI  +heartburn,+indigestion, no-abdominal pain, nausea, vomiting, Resp:   No excess mucus, no-productive cough,  + non-productive cough,  No coughing up of blood.  No change in color of mucus.  + wheezing. +Short of breath mainly w/ exertion.  Skin: no rash or lesions. GU:  MS:  No joint pain or swelling. Marland Kitchen Psych:  No change in mood or affect. No depression or anxiety.  No memory loss.  Objective:   Physical Exam General- Alert,  Oriented, Affect-appropriate, Distress- none acute,  Looks well Skin- rash-none, lesions- none, excoriation- none,  Lymphadenopathy- none Head- atraumatic            Eyes- Gross vision intact, PERRLA, conjunctivae- clear secretions            Ears- Hearing, canals normal            Nose- + mild stuffy, no-Septal dev,  polyps, erosion, perforation  Throat- Mallampati II , mucosa clear , drainage- none, tonsils- atrophic.  Neck- flexible , trachea midline, no stridor , thyroid nl, carotid no bruit Chest - symmetrical excursion , unlabored           Heart/CV- RRR , no murmur , no gallop  , no rub, nl s1 s2                           - JVD- none , edema- none, stasis changes- none, varices- none           Lung-    Wheeze+  bilateral, unlabored , able to speak full sentences.  Cough-none,                                             dullness-none, rub- none.            Chest wall-  Abd- Br/ Gen/ Rectal- Not done, not indicated Extrem- cyanosis- none, clubbing, none, atrophy- none, strength- nl Neuro- grossly intact to observation            Patient ID: [name], male    DOB: 11-26-1955, 60 y.o.   MRN: [id number]  HPI 4/ 3/12- 48 yo never smoker with hx chronic obstructive asthma, now on Xolair. Xolair is being dosed 150 mg per month, but he feels it wearing off by 3 weeks and asks about a dosing change. He remains on prednisone 5 mg daily. At 3 weeks after a xolair dose he begins to feel tighter, more wheeze. He had to miss work today because of asthma and he increased prednisone today to 20 mg. . He denies obvious change with seasonal pollens- no itch, sneeze,watery eyes.   10/24/10- 48 yo never smoker with hx chronic obstructive asthma, now on Xolair. Has continued Xoalir monthly. One injection/ month lasted few weeks-not a month. Increased to 2 injections/ month, same total dose,  and not as clearly helping. Has continued chronic maintenance prednisone. We discussed  allergic vs nonallergic triggers. He is watching what happens as we enter Fall season. He had to miss work due to asthma flare 2 days ago and was aggresively using neb. Trigger was lawnmower smoke in garage.  Today is great. Currently on prednisone 20 mg daily. He remembers bone density check by Dr Lavone Orn within last few years.  We discussed Daliresp and he is interested in giving it another try.   04/18/11- 39 yo never smoker with hx chronic obstructive asthma, now on Xolair. He has been trying to get to lower maintenance prednisone dose and asked for higher Xolair dose. We increased him 300-> Xolair 375 mg [place] monthly.    Wife here  FOLLOWS FOR: been out of work for 3 days as of today-doesnt want to go up past prednisone 15mg  (was on 30mg ),using Zyflo. Wheezing increased-getting up in middle of night to do breathing tx's.   He was on prednisone 40 mg daily for several weeks before trying to drop down. Using nebulizer at night. Concerned about increased body fat from prednisone. We have had many long conversations about steroid side effects and alternatives over the years. Theophylline did work when he was in college. Office spirometry: Severe obstructive airways disease. FEV1 1.29/35%, FEV1/FEC 0.46.  06/23/11- 89 yo never smoker with hx chronic obstructive asthma, now on Xolair. We had referred  him to do, with the expectation he might be considered for thermal bronchoscopy to treat his asthma. They're treating him for reflux with Prilosec twice daily. He feels a Xolair 375 mg is helping after 2 doses, the way the first few doses did at 275 mg. He was just able to reduce his prednisone to 5 mg daily with mild residual wheeze. We reviewed findings associated with steroid withdrawal and adrenal insufficiency and discussed endocrinology referral to help with this issue.  08/20/11- 43 yo never smoker with hx chronic obstructive asthma, now on Xolair. Pt c/o increased SOB, chest tightness and prod cough x  1 week  Pt tx with Pred 30 mg x1 week then increased to 50mg  qd x 2days. Pt denies f/c/s.  Pt states even with the prednisone he cannot get relief. He has been on 30 mg of prednisone for most of this spring on a daily basis, usually with some wheeze but able to work. In the last day or 2 chest tightness and wheeze have been worse without specific reason. He had to increase prednisone to 50 mg daily. He continues Xolair injections. No recent colds or obvious exposures. Scant phlegm with trace yellow. He denies sinus problems. Taking Prilosec twice daily as directed by the pulmonologist we sent him to at [facility]. We sent him specifically for evaluation of thermoplasty. He apparently is a little outside of their selection criteria but they're considering him. He has missed work intermittently. There is no part-time work available but he feels unable to do a 40 hour work week anymore  09/17/11- 84 yo never smoker with hx chronic obstructive asthma, now on Xolair.  Pt states he has notices a lot of improvement while on the Pred taper (now on 10mg ). Pt c/o slight wheezing and sob, but not too concerned. Pt denies chest tightness, prod cough.  Discussed his work with the pulmonologist at [facility]. They emphasize management of his GERD, demonstrated with barium swallow. They plan PFTs at his next visit so we won't do them here. He has been out of work now for 3 months. 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 He continues Brovana and Atrovent nebulizers twice daily with rare need now for rescue inhaler. He did like Arcapta and we compared that to his nebulizer. He failed Singulair and Zyflo.  11/07/11- 51 yo never smoker with hx chronic obstructive asthma, ended Xolair June, 2013.    Wife here. Still having increased SOB-currently on 8mg  Prednisone-stilll out of work. Wheezing as well and cough. Recent exacer- improved after doxy. Has been able to maintain on prednisone 8 mg daily  but no lower. [facility] said not good candidate for thermoplasty. Short term disability through Sept 11.  Long discussion of options, meds. Will try Brovana neb in AM/ Arcapta in PM.   12/10/11- 30 yo never smoker with hx chronic obstructive asthma, ended Xolair June, 2013.    Wife here. Still having SOB and wheezing at times; still out of work. Can tolerate light to moderate exertion on some days, but on other days wheezing dyspnea is too limiting. He is pleased to be would remain on only 8 mg daily prednisone now for 2 months at that dose. We have carefully discussed long-term steroid side effects. He asks extension of leave of absence one more month and has applied for total and permanent disability. Now out of work x3 months. No longer being followed at [facility] pulmonary. COPD assessment test (CAT) score 22/40.  01/07/12- 87 yo never smoker with hx chronic obstructive asthma, ended Xolair June, 2013.  Breathing no better but no worse than last time He considers that a good thing that he has been able to hold prednisone maintenance at 8 milligrams daily. Exertional dyspnea easy fatigue little cough no chest pain or fever. He talks about the possibility of going back to work but it doesn't seem realistic. We agreed to extend his short term disability. CXR 12/30/11-    IMPRESSION:  1. Stable cardiomegaly with now mild pulmonary vascular  congestion.  2. Stable scarring at the lung apices bilaterally.  Original Report Authenticated By: Resa Miner. MATTERN, M.D.   04/08/12- 27 yo never smoker with hx chronic obstructive asthma, ended Xolair June, 2013.  FOLLOWS FOR: wheezing; has gotten worse in past 2-3 weeks even after albuterol and atrovent this morning. Acute exacerbation x 2-3 weeks-? Weather, but has not had a cold. Using all meds, incl used neb/ Duoneb 2 hours before arival this AM.  Remains out of work appropriately since June, 2013. Chronic asthma with COPD, steroid dependent trying not to go  over 8 mg prednisone daily. Baseline daily wheeze and dyspnea, with occasional exacerbation. We have been able to keep him out of hospital because he is good at self-management.  Failed Daliresp and Xolair. Several second-opinion visits to [facility] in 2013. They did not feel he was thermoplasty candidate.  Office spirometry 04/08/12- severe obstructive airways disease- FVC 2.64/58%, FEV1 1.30/36%, FEV1/FVC 0.49, FEF 25-75% 0.50/14%.  06/17/12- 56 yo never smoker with hx chronic obstructive asthma, ended Xolair June, 2013. ACUTE VISIT: chest tightness-feels more tightness in left side of chest, increased wheezing and SOB as well. Coughing-forces cough to bring mucus up. Worse asthma x2 weeks without fever, sore throat, itching or sneezing area and started herself back on Singulair. He has been on 8 mg of Medrol daily which is as low as he has been able to get in a long time. Using his nebulizer but seemed bring up scant clear mucus. He has not been taking Proventil tablets and has not tried the sample of Breo Ellipta.  11/03/12- 50 yo never smoker with hx chronic obstructive asthma, ended Xolair June, 2013. GERD FOLLOWS FOR: breathing not the best; SOB and wheezing, chest congestion, cough -tightness in chest at times(able to get phlegm up after breathing tx's sometimes) Daily wheeze, cough, short of breath at baseline. Nebulizer produces clear sputum. Now has disability. Continues prednisone 10 mg daily. Occasionally aware of throat burning reflux despite Prilosec twice a day. CXR 06/17/12 IMPRESSION:  No acute finding.  Original Report Authenticated By: Orlean Patten, M.D.  03/16/13- 72 yo never smoker with hx chronic obstructive asthma, ended Xolair June, 2013. GERD FOLLOWS [place]:[id number] like he is on the "fence" with his breathing being on Pred 10 mg and Dulera daily; unsure if he needs a shot of depo; can hear him wheezing(he states this happens all day long) Levaquin worked well. Now back on  prednisone maintenance 10 mg daily plus Dulera but wheezing every day.h eartburn controlled. CXR 06/17/12 IMPRESSION:  No acute finding.  Original Report Authenticated By: Orlean Patten, M.D.  07/21/13- 27 yo never smoker with hx chronic obstructive asthma, ended Xolair June, 2013. GERD FOLLOWS FOR: P states his breathing is okay considering the weather and pollen. Head congestion, sinus pressure. He coughs some every day and uses nebulizer and rescue inhaler a couple of times per day to say and control. He expects seasonal flare with ragweed in  the fall.  914/15- 60 yo never smoker with hx chronic obstructive asthma, ended Xolair June, 2013. GERD FOLLOWS FOR: occasional nonprod cough with clear mucus, SOB with exertion.  States he is feeling well.  On prednisone 10 mg daily for the last month, which he considers good. Home oximetry running around 92% on room air. Ruthe Mannan seems to work better as a maintenance inhaler. Admits occasional depression despite Lexapro, which he will discuss with his primary physician.  03/23/14-  66 yo never smoker with hx chronic obstructive asthma, ended Xolair June, 2013. GERD FOLLOWS FOR:had flare up of Asthma late December and was given Doxycycline Rx with refill-had to take additional refill due to symptoms coming back-currently on it. Symptoms have stopped again but unsure if will come back. Has 2 more days of the current doxycycline prescription. Now taking prednisone 20 mg daily with goal of getting back to 10 mg daily. Wife and daughter are both sick now.  09/27/14- 34 yo never smoker with hx chronic obstructive asthma, ended Xolair June, 2013. GERD FOLLOWS FOR: Pt states his breathing is "terrible"; had to go on Doxy Rx as well as Pred 40mg  dose. Currently on Pred 25 mg QD tapering down.  Worse SOB, chest tightness x 6 weeks. No clear cause- ?weather. No nasal congestion. Started doxy - finished 1 week ago. Prednisone burst to 40 mg, now at 25 mg.    01/31/2015-60 year old male never smoker followed for chronic obstructive asthma, ended Xolair 0000000, complicated by GERD Acute: Follows For: pt states he is having sharp upper left chest pains thats appeared within the last 48 hours and has had a fever. pt states he always has SOB but his breathing is under control. pt had STAT CXR done today. He had recently requested adrenal gland function assessment and a cortisone stimulation test is scheduled Onset of fever and myalgias one week ago. Sharp left anterior chest pain began 48 hours ago. Low back pain present about 2 weeks. Fever now gone. No change in chronic cough with slight yellow sputum. Denies sore throat or rash. Continuing prednisone 10 mg daily. He was trying 9 mg but ran out of 1 mg tabs. Began Avelox prescribed yesterday and has Diflucan for question of thrush. No discomfort in legs. CXR 01/31/2015-image reviewed with him and his wife looks stable with chronic asymmetry, increased vascular perfusion right lung relative to left and scoliosis IMPRESSION: 1. No active lung disease. Cannot exclude tiny left pleural effusion. 2. Peribronchial thickening may indicate bronchitis. Electronically Signed  By: Ivar Drape M.D.  On: 01/31/2015 09:18  03/30/2015-60 year old male never smoker followed for chronic obstructive asthma, ended Xolair 08/28/2011, hx RML resection age 54, chronic sinus disease, complicated by GERD Follows For: Still having occ sob and wheezing. denies any cp/tightness. No recent acute events. He feels near his baseline with nonproductive rattling cough. Prednisone at 10 mg daily. ACTH stimulation test consistent with adrenal insufficiency. He understands significance and wants to make his own endocrine referral. Maintenance prednisone suppresses eosinophil count so we can't demonstrate qualification for a trial of Nucala therapy We reviewed and compared chest x-rays showing chronic increased markings right lung and  mild scoliosis to right. CXR 02/10/2015- IMPRESSION: No active cardiopulmonary disease. Stable mild pleural-parenchymal scarring at the left costophrenic angle. Electronically Signed  By: Ilona Sorrel M.D.  On: 02/10/2015 15:11  09/20/2015- 60 year old male never smoker followed for chronic obstructive asthma, ended Xolair 08/28/2011, hx RML resection age 39, chronic sinus disease, restless legs, complicated by GERD FOLLOW  FOR: breathing is good now, had to increase his prednisone, started taking 30mg  for 4 days and dropped it down to 25mg . Finished a round of Doxycycline.  Needs refills.  He had gotten prednisone down to 7 mg daily, working with endocrinologist [name]. Then caught a distinct viral pattern cold from his daughter and had to flare prednisone to 30 mg daily 1 week ago. Now down to 25 mg daily.  New complaint of restless legs keeping him awake More willing to admit anxiety and asks for some help-discussed Baseline cough with scant white or yellow sputum  Review of Systems-See HPI Constitutional:   No weight loss, night sweats,  Fevers, chills, fatigue, lassitude. HEENT:   No headaches,  Difficulty swallowing,  Tooth/dental problems,  Sore throat,                No sneezing, itching, ear ache, No-nasal congestion, post nasal drip,  CV:  + chest pain, orthopnea, PND, swelling in lower extremities, anasarca, dizziness, palpitations GI  +heartburn,+indigestion, no-abdominal pain, nausea, vomiting, Resp:   No excess mucus, no-productive cough,  + non-productive cough,  No coughing up of blood.  No change in color of mucus.  + wheezing. +Short of breath mainly w/ exertion.  Skin: no rash or lesions. GU:  MS:  No joint pain or swelling. Marland Kitchen Psych:  No change in mood or affect. No depression or anxiety.  No memory loss.  Objective:   Physical Exam General- Alert, Oriented, Affect-appropriate, Distress- none acute,  Looks well-Not cushingoid  Skin- rash-none, lesions- none,  excoriation- none,  Lymphadenopathy- none Head- atraumatic            Eyes- Gross vision intact, PERRLA, conjunctivae- clear secretions            Ears- Hearing, canals normal            Nose- + mild stuffy, no-Septal dev,  polyps, erosion, perforation             Throat- Mallampati II , mucosa clear , drainage- none, tonsils- atrophic.  Neck- flexible , trachea midline, no stridor , thyroid nl, carotid no bruit Chest - symmetrical excursion , unlabored           Heart/CV- RRR , no murmur , no gallop  , no rub, nl s1 s2                           - JVD- none , edema- none, stasis changes- none, varices- none           Lung-    Wheeze+ Trace, unlabored , able to speak full sentences. Cough + Coarse/raspy,                                             dullness-none, rub- none.            Chest wall-  Abd- Br/ Gen/ Rectal- Not done, not indicated Extrem- cyanosis- none, clubbing, none, atrophy- none, strength- nl Neuro- grossly intact to observation

## 2015-09-20 NOTE — Patient Instructions (Addendum)
Refill scripts sent Symbicort  Omeprazole  Prednisone   Scripts printed: Nebulizer machine through DME  Doxycycline  Requip for legs  Xanax for anxiety and sleep  Please call as needed

## 2015-09-21 DIAGNOSIS — F32A Depression, unspecified: Secondary | ICD-10-CM | POA: Insufficient documentation

## 2015-09-21 DIAGNOSIS — G2581 Restless legs syndrome: Secondary | ICD-10-CM | POA: Insufficient documentation

## 2015-09-21 DIAGNOSIS — F419 Anxiety disorder, unspecified: Secondary | ICD-10-CM

## 2015-09-21 DIAGNOSIS — F329 Major depressive disorder, single episode, unspecified: Secondary | ICD-10-CM | POA: Insufficient documentation

## 2015-09-21 NOTE — Assessment & Plan Note (Signed)
He gives a good description. We discussed Requip as an option for trial Plan-Requip

## 2015-09-21 NOTE — Assessment & Plan Note (Signed)
Gives clear story of viral pattern acute bronchitis exacerbation Plan-nebulizer machine replaced. Continue with current tools, extra hydration and rest.

## 2015-09-21 NOTE — Assessment & Plan Note (Signed)
Plan-nebulizer machine renewed Discussed medications. He has to pace himself more consistently now. Has been able to do some fishing because he just sits.

## 2015-09-21 NOTE — Assessment & Plan Note (Addendum)
Situational depression reflects realistic understanding of his health problems, prognosis and limitations. Plan-Xanax to support counseling

## 2015-09-27 ENCOUNTER — Ambulatory Visit: Payer: 59 | Admitting: Internal Medicine

## 2015-10-25 ENCOUNTER — Telehealth: Payer: Self-pay | Admitting: Internal Medicine

## 2015-10-25 NOTE — Telephone Encounter (Signed)
Omeprazole is not covered for BID by pt's insurance. Called [facility] at [phone]. Pt's ID: [id number]. PA has been started and has been sent for clinical review. We will receive the decision via fax. PA case number is: [id number]. Will route to [facility] for follow up.

## 2015-10-30 NOTE — Telephone Encounter (Signed)
Called [employer] and checked the status of PA. This was approved and being shipped to the patient. Nothing further needed

## 2016-01-03 ENCOUNTER — Telehealth (HOSPITAL_COMMUNITY): Payer: Self-pay | Admitting: *Deleted

## 2016-01-09 ENCOUNTER — Telehealth: Payer: Self-pay | Admitting: Internal Medicine

## 2016-01-09 NOTE — Telephone Encounter (Signed)
I called and lmom to make [name] aware of appt that we will be offering to the pt.    I have called the pt and lmomtcb x 1

## 2016-01-09 NOTE — Telephone Encounter (Signed)
Pt can be seen 01-10-16 at 10:45am slot(15 minutes okay). Thanks.

## 2016-01-10 NOTE — Telephone Encounter (Signed)
Called and spoke to pt. Pt states he does not feel an appt is necessary, pt states he feel is doing well.   ATC [name] at [place] but automated voice states the call could not go through, try again later. WCB.

## 2016-01-11 NOTE — Telephone Encounter (Signed)
lmtcb X2 for [employer] at [facility]

## 2016-01-14 NOTE — Telephone Encounter (Signed)
Attempted to call Joellen Jersey to make her aware of pts recs.  The office will not open until 830.

## 2016-01-17 NOTE — Telephone Encounter (Signed)
Unable to contact [name] at [facility].  Will sign off of message.

## 2016-01-21 ENCOUNTER — Telehealth: Payer: Self-pay | Admitting: Internal Medicine

## 2016-01-21 NOTE — Telephone Encounter (Signed)
Packet completed by CY and given back to [place].

## 2016-01-21 NOTE — Telephone Encounter (Signed)
Rec'd paperwork back from [facility] to [facility] via interoffice mail - 01/21/16-pr

## 2016-01-28 ENCOUNTER — Other Ambulatory Visit: Payer: Self-pay | Admitting: Internal Medicine

## 2016-01-28 NOTE — Telephone Encounter (Signed)
Ok to refill as before 

## 2016-01-28 NOTE — Telephone Encounter (Signed)
CY Please advise on refill. Thanks.  

## 2016-03-24 ENCOUNTER — Encounter: Payer: Self-pay | Admitting: Internal Medicine

## 2016-03-24 ENCOUNTER — Ambulatory Visit (INDEPENDENT_AMBULATORY_CARE_PROVIDER_SITE_OTHER): Payer: 59 | Admitting: Internal Medicine

## 2016-03-24 VITALS — BP 118/76 | HR 72 | Ht 68.0 in | Wt 166.8 lb

## 2016-03-24 DIAGNOSIS — J42 Unspecified chronic bronchitis: Secondary | ICD-10-CM

## 2016-03-24 DIAGNOSIS — J4489 Other specified chronic obstructive pulmonary disease: Secondary | ICD-10-CM

## 2016-03-24 DIAGNOSIS — J479 Bronchiectasis, uncomplicated: Secondary | ICD-10-CM

## 2016-03-24 DIAGNOSIS — E279 Disorder of adrenal gland, unspecified: Secondary | ICD-10-CM | POA: Diagnosis not present

## 2016-03-24 DIAGNOSIS — J449 Chronic obstructive pulmonary disease, unspecified: Secondary | ICD-10-CM | POA: Diagnosis not present

## 2016-03-24 MED ORDER — PREDNISONE 10 MG PO TABS: 10.0000 mg | ORAL_TABLET | Freq: Every day | ORAL | 3 refills | Status: DC

## 2016-03-24 MED ORDER — DOXYCYCLINE HYCLATE 100 MG PO TABS: 100.0000 mg | ORAL_TABLET | Freq: Two times a day (BID) | ORAL | 12 refills | Status: DC

## 2016-03-24 MED ORDER — FLUTTER DEVI: 0 refills | Status: AC

## 2016-03-24 MED ORDER — PREDNISONE 1 MG PO TABS: ORAL_TABLET | ORAL | 5 refills | Status: DC

## 2016-03-24 NOTE — Progress Notes (Signed)
Patient ID: Eric [name], male    DOB: 12-25-1955, 61 y.o.   MRN: [id number]  HPI male never smoker followed for chronic obstructive asthma, ended Xolair 08/28/2011, hx RML resection age 71, chronic sinus disease, restless legs, complicated by GERD  Office spirometry 04/08/12- severe obstructive airways disease- FVC 2.64/58%, FEV1 1.30/36%, FEV1/FVC 0.49, FEF 25-75% 0.50/14%.    ---------------------------------------------------------------------------  09/20/2015- 61 year old male never smoker followed for chronic obstructive asthma, ended Xolair 08/28/2011, hx RML resection age 71, chronic sinus disease, restless legs, complicated by GERD FOLLOW FOR: breathing is good now, had to increase his prednisone, started taking 30mg  for 4 days and dropped it down to 25mg . Finished a round of Doxycycline.  Needs refills.  He had gotten prednisone down to 7 mg daily, working with endocrinologist [name]. Then caught a distinct viral pattern cold from his daughter and had to flare prednisone to 30 mg daily 1 week ago. Now down to 25 mg daily.  New complaint of restless legs keeping him awake More willing to admit anxiety and asks for some help-discussed Baseline cough with scant white or yellow sputum  03/24/2016- 61 year old male never smoker followed for chronic obstructive asthma, ended Xolair 08/28/2011, history RML resection age 11, chronic sinus disease, restless legs, complicated by GERD FOLLOWS FOR: Pt states his breathing is holding well at this time; about 1 month ago started having a productive cough-was given Dxoycycline to use. Now feeling better. Continues maintenance prednisone 10 mg daily. Chronic reductive cough, usually nonpurulent, no blood. Now feels well. Always wheezes. Has tried to remain active, practice breathing techniques, and to keep his airways clear Heartburn/GERD currently well controlled.  Review of Systems-See HPI Constitutional:   No weight loss, night sweats,  Fevers,  chills, fatigue, lassitude. HEENT:   No headaches,  Difficulty swallowing,  Tooth/dental problems,  Sore throat,                No sneezing, itching, ear ache, No-nasal congestion, post nasal drip,  CV:   chest pain, orthopnea, PND, swelling in lower extremities, anasarca, dizziness, palpitations GI    Heartburn, indigestion, no-abdominal pain, nausea, vomiting, Resp:    +-productive cough,  + non-productive cough,  No coughing up of blood.  No change in color of mucus.                        + wheezing. +Short of breath mainly w/ exertion.  Skin: no rash or lesions. GU:  MS:  No joint pain or swelling. Marland Kitchen Psych:  No change in mood or affect. No depression or anxiety.  No memory loss.  Objective:   Physical Exam General- Alert, Oriented, Affect-appropriate, Distress- none acute,  Looks well-Not cushingoid  Skin- rash-none, lesions- none, excoriation- none,  Lymphadenopathy- none Head- atraumatic            Eyes- Gross vision intact, PERRLA, conjunctivae- clear secretions            Ears- Hearing, canals normal            Nose- + mild stuffy, no-Septal dev,  polyps, erosion, perforation             Throat- Mallampati II , mucosa clear , drainage- none, tonsils- atrophic.  Neck- flexible , trachea midline, no stridor , thyroid nl, carotid no bruit Chest - symmetrical excursion , unlabored           Heart/CV- RRR , no murmur , no gallop  ,  no rub, nl s1 s2                           - JVD- none , edema- none, stasis changes- none, varices- none           Lung-    Wheeze+, unlabored , able to speak full sentences. Cough + Coarse/raspy/ few rhonchi,                                             dullness-none, rub- none.            Chest wall-  Abd- Br/ Gen/ Rectal- Not done, not indicated Extrem- cyanosis- none, clubbing, none, atrophy- none, strength- nl Neuro- grossly intact to observation

## 2016-03-24 NOTE — Patient Instructions (Signed)
Order- CT chest no contrast       Dx chronic bronchitis/ bronchiectasis  Order- Flutter device  Script printed    Blow through 3 times per set, three times daily as needed to help clear airway secretions  Refills sent for prednisone and doxycycline   Please call for inhaler refills and as needed

## 2016-04-01 ENCOUNTER — Ambulatory Visit (INDEPENDENT_AMBULATORY_CARE_PROVIDER_SITE_OTHER)
Admission: RE | Admit: 2016-04-01 | Discharge: 2016-04-01 | Disposition: A | Discharge status: Home / Self Care | Payer: 59 | Source: Ambulatory Visit | Attending: Internal Medicine | Admitting: Internal Medicine

## 2016-04-01 DIAGNOSIS — L821 Other seborrheic keratosis: Secondary | ICD-10-CM | POA: Diagnosis not present

## 2016-04-01 DIAGNOSIS — D485 Neoplasm of uncertain behavior of skin: Secondary | ICD-10-CM | POA: Diagnosis not present

## 2016-04-01 DIAGNOSIS — J42 Unspecified chronic bronchitis: Secondary | ICD-10-CM

## 2016-04-01 DIAGNOSIS — D1801 Hemangioma of skin and subcutaneous tissue: Secondary | ICD-10-CM | POA: Diagnosis not present

## 2016-04-01 DIAGNOSIS — D225 Melanocytic nevi of trunk: Secondary | ICD-10-CM | POA: Diagnosis not present

## 2016-04-01 DIAGNOSIS — L814 Other melanin hyperpigmentation: Secondary | ICD-10-CM | POA: Diagnosis not present

## 2016-04-01 DIAGNOSIS — D235 Other benign neoplasm of skin of trunk: Secondary | ICD-10-CM | POA: Diagnosis not present

## 2016-04-01 DIAGNOSIS — J479 Bronchiectasis, uncomplicated: Secondary | ICD-10-CM | POA: Diagnosis not present

## 2016-04-01 DIAGNOSIS — J439 Emphysema, unspecified: Secondary | ICD-10-CM | POA: Diagnosis not present

## 2016-04-01 DIAGNOSIS — R972 Elevated prostate specific antigen [PSA]: Secondary | ICD-10-CM | POA: Diagnosis not present

## 2016-04-02 NOTE — Assessment & Plan Note (Signed)
Chronic maintenance prednisone to suppress airway inflammation and support adrenal cortical insufficiency

## 2016-04-02 NOTE — Assessment & Plan Note (Signed)
There is clinical bronchiectasis and he is not managing to clear secretions adequately from his airways on a chronic basis. Plan-CT chest looking for bronchiectatic change. Flutter device. Consider pneumatic vest if conservative measures are insufficient.

## 2016-05-21 ENCOUNTER — Telehealth: Payer: Self-pay | Admitting: Internal Medicine

## 2016-05-21 MED ORDER — MONTELUKAST SODIUM 10 MG PO TABS: 10.0000 mg | ORAL_TABLET | Freq: Every day | ORAL | 3 refills | Status: DC

## 2016-05-21 MED ORDER — ALBUTEROL SULFATE HFA 108 (90 BASE) MCG/ACT IN AERS: INHALATION_SPRAY | RESPIRATORY_TRACT | 3 refills | Status: DC

## 2016-05-21 NOTE — Telephone Encounter (Signed)
Spoke with pt, requesting a 90 day supply on singulair and albuterol inhaler to be sent to [facility]. This has been sent.  Nothing further needed at this time.

## 2016-06-09 ENCOUNTER — Telehealth: Payer: Self-pay | Admitting: Internal Medicine

## 2016-06-09 ENCOUNTER — Other Ambulatory Visit: Payer: 59

## 2016-06-09 DIAGNOSIS — J209 Acute bronchitis, unspecified: Secondary | ICD-10-CM

## 2016-06-09 NOTE — Telephone Encounter (Signed)
Per CY-lets order sputum culture and smear. Fungal, AFB for dx chronic bronchitis exacerbation. Thanks.

## 2016-06-09 NOTE — Telephone Encounter (Signed)
Spoke with pt's spouse and notified of recs per [name]  She verbalized understanding  Sputum cup up front for pick up

## 2016-06-09 NOTE — Telephone Encounter (Signed)
Pt wife calling, states that they would like to drop off a sputum sample if Dr Annamaria Boots will placed orders to test it. Pt wife states that she is coming to town within the next hour.  Pt states that he completed an abx last week around 06/04/16 and is still getting up thick dark yellow mucus. Denies any fever.  Please advise Dr Annamaria Boots. Thanks.

## 2016-06-09 NOTE — Telephone Encounter (Signed)
Ok

## 2016-06-13 ENCOUNTER — Telehealth: Payer: Self-pay | Admitting: Internal Medicine

## 2016-06-13 NOTE — Telephone Encounter (Signed)
Spoke with pt, aware of results/recs.  Nothing further needed.  

## 2016-06-13 NOTE — Telephone Encounter (Signed)
No fungal or AFB organisms identified on smear or culture so far. Final culture reports are released at 6 weeks.

## 2016-06-13 NOTE — Telephone Encounter (Signed)
Spoke with pt, requesting sputum results.   CY please advise.  Thanks!

## 2016-06-17 LAB — AFB CULTURE WITH SMEAR (NOT AT ARMC)

## 2016-06-30 ENCOUNTER — Other Ambulatory Visit: Payer: Self-pay | Admitting: Internal Medicine

## 2016-06-30 DIAGNOSIS — L570 Actinic keratosis: Secondary | ICD-10-CM | POA: Diagnosis not present

## 2016-06-30 DIAGNOSIS — Z48817 Encounter for surgical aftercare following surgery on the skin and subcutaneous tissue: Secondary | ICD-10-CM | POA: Diagnosis not present

## 2016-07-08 ENCOUNTER — Telehealth: Payer: Self-pay | Admitting: Internal Medicine

## 2016-07-08 ENCOUNTER — Encounter: Payer: Self-pay | Admitting: Internal Medicine

## 2016-07-08 DIAGNOSIS — J449 Chronic obstructive pulmonary disease, unspecified: Secondary | ICD-10-CM

## 2016-07-08 LAB — FUNGUS CULTURE W SMEAR: Source:: 195718999

## 2016-07-08 NOTE — Telephone Encounter (Signed)
Spoke with pt's wife Mickel Baas, wants to clarify if pt will be receiving a charge for a routine culture on pt's sputum- I advised that no routine culture was done so no charge could be charged; just the AFB and fungus culture were tested.  Pt's wife is wishing to speak to [name] directly to see why CY felt the need to test fungus and AFB, and to see if [facility] has ever been isolated in pt's sputum- I do not see another AFB sample in pt's chart.    Forwarding to KW to follow up on-

## 2016-07-09 NOTE — Telephone Encounter (Signed)
CY Please advise on why you decided to test for fungus and AFB on patients sputum culture; Thanks.

## 2016-07-09 NOTE — Telephone Encounter (Signed)
Patient's spouse is requesting to speak to [facility] directly. KW please advise.

## 2016-07-09 NOTE — Telephone Encounter (Signed)
LMTCB for the pt's spouse

## 2016-07-09 NOTE — Telephone Encounter (Signed)
Pt's wife is aware of reason for doing sputum cultures. Mickel Baas states she feels that pt needs to have resp culture and AFB smear ordered so we can see what is causing pt to continue to bring up yellow sputum.    Mickel Baas is aware that CY has approved for resp culture with sensitivity if needed and AFB smear. Orders placed and nothing more needed at this time.

## 2016-07-09 NOTE — Telephone Encounter (Signed)
We were looking for chronic infections that wouldn't respond to common antibiotics, but might be associated with his chronic lung disease pattern.

## 2016-07-09 NOTE — Telephone Encounter (Signed)
Patient wife returning call - she can be reached at [phone] -pr

## 2016-08-03 ENCOUNTER — Other Ambulatory Visit: Payer: Self-pay | Admitting: Internal Medicine

## 2016-08-07 ENCOUNTER — Telehealth: Payer: Self-pay | Admitting: Internal Medicine

## 2016-08-07 ENCOUNTER — Other Ambulatory Visit: Payer: Self-pay | Admitting: Internal Medicine

## 2016-08-07 NOTE — Telephone Encounter (Signed)
[employer] documents placed on CY cart to be completed.

## 2016-08-08 NOTE — Telephone Encounter (Signed)
Will send to [name] to follow up 

## 2016-08-11 NOTE — Telephone Encounter (Signed)
Done

## 2016-08-11 NOTE — Telephone Encounter (Signed)
Rec'd completed paperwork - fwd to [employer] via interoffice mail -pr  

## 2016-08-22 ENCOUNTER — Other Ambulatory Visit: Payer: Self-pay | Admitting: Internal Medicine

## 2016-08-22 DIAGNOSIS — J449 Chronic obstructive pulmonary disease, unspecified: Secondary | ICD-10-CM

## 2016-09-01 NOTE — Telephone Encounter (Signed)
Addendum: Sputum specimen cups were placed up front to pick up. These were never picked up and sputum sample never obtained. Will send to [facility] as Eric [name]

## 2016-09-22 ENCOUNTER — Encounter: Payer: Self-pay | Admitting: Internal Medicine

## 2016-09-22 ENCOUNTER — Ambulatory Visit (INDEPENDENT_AMBULATORY_CARE_PROVIDER_SITE_OTHER): Payer: 59 | Admitting: Internal Medicine

## 2016-09-22 VITALS — BP 106/70 | HR 68 | Ht 68.0 in | Wt 167.0 lb

## 2016-09-22 DIAGNOSIS — J302 Other seasonal allergic rhinitis: Secondary | ICD-10-CM | POA: Diagnosis not present

## 2016-09-22 DIAGNOSIS — J479 Bronchiectasis, uncomplicated: Secondary | ICD-10-CM | POA: Diagnosis not present

## 2016-09-22 DIAGNOSIS — J3089 Other allergic rhinitis: Secondary | ICD-10-CM | POA: Diagnosis not present

## 2016-09-22 DIAGNOSIS — J449 Chronic obstructive pulmonary disease, unspecified: Secondary | ICD-10-CM

## 2016-09-22 MED ORDER — FLUTICASONE PROPIONATE 50 MCG/ACT NA SUSP: NASAL | 3 refills | Status: AC

## 2016-09-22 NOTE — Patient Instructions (Addendum)
Order- pneumatic Vest     Use for 15 minutes 4 times daily or as directed      Dx bronchiectasis  Script sent for flonase refill

## 2016-09-22 NOTE — Assessment & Plan Note (Signed)
Perennial nasal congestion without visible polyps now. Plan-refill Flonase

## 2016-09-22 NOTE — Progress Notes (Signed)
Patient ID: Eric [name], male    DOB: Aug 01, 1955, 61 y.o.   MRN: [id number]  HPI male never smoker followed for chronic obstructive asthma, ended Xolair 08/28/2011, hx RML resection age 62, chronic sinus disease, restless legs, complicated by GERD  Office spirometry 04/08/12- severe obstructive airways disease- FVC 2.64/58%, FEV1 1.30/36%, FEV1/FVC 0.49, FEF 25-75% 0.50/14%.    ---------------------------------------------------------------------------  03/24/2016- 62 year old male never smoker followed for chronic obstructive asthma, ended Xolair 08/28/2011, history RML resection age 43, chronic sinus disease, restless legs, complicated by GERD FOLLOWS FOR: Pt states his breathing is holding well at this time; about 1 month ago started having a productive cough-was given Dxoycycline to use. Now feeling better. Continues maintenance prednisone 10 mg daily. Chronic reductive cough, usually nonpurulent, no blood. Now feels well. Always wheezes. Has tried to remain active, practice breathing techniques, and to keep his airways clear Heartburn/GERD currently well controlled.  09/22/16- 61 year old male never smoker followed for chronic obstructive asthma, ended Xolair 08/28/2011, history RML resection age 17, chronic sinus disease, restless legs, complicated by GERD FOLLOWS FOR: Pt states his SOB is at baseline. Pt c/o prod cough with clear mucus - also at baseline. Pt denies CP/tightness and f/c/s  We reviewed his CT scan which had shown evidence of bronchiectasis as expected. He has done what he could with chest PT and his flutter device. He agrees to try a pneumatic vest to help with secretion clearance. He has not had evidence of an allergic asthma pattern that would qualify for Xolair or Nucala. 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 Continues prednisone 10 mg daily  long-term. Chest has been doing pretty well this summer but he has sustained nasal congestion. CT chest 04/02/16 1. Emphysematous changes along with areas of bronchial wall thickening possibly due to chronic reactive airways disease or bronchitis. 2. Patchy areas of subpleural atelectasis but no focal pneumonia or worrisome pulmonary lesions. 3. 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 4. No mediastinal/hilar adenopathy. ADDENDUM: Clinical concern was specifically bronchiectasis. There are a few patchy areas of minimal bronchiectasis for example right upper lobe image number 76, right middle lobe image number 96 and left upper lobe image number 38.  Review of Systems-See HPI   + = pos Constitutional:   No weight loss, night sweats,  Fevers, chills, fatigue, lassitude. HEENT:   No headaches,  Difficulty swallowing,  Tooth/dental problems,  Sore throat,                No sneezing, itching, ear ache, +-nasal congestion, post nasal drip,  CV:   chest pain, orthopnea, PND, swelling in lower extremities, anasarca, dizziness, palpitations GI    Heartburn, indigestion, no-abdominal pain, nausea, vomiting, Resp:    +-productive cough,  + non-productive cough,  No coughing up of blood.  No change in color of mucus.                                       wheezing. +Short of breath mainly w/ exertion.  Skin: no rash or lesions. GU:  MS:  No joint pain or swelling. Marland Kitchen Psych:  No change in mood or affect. No depression or anxiety.  No memory loss.  Objective:   Physical Exam General- Alert, Oriented, Affect-appropriate, Distress- none acute,  Looks well-Not cushingoid  Skin- rash-none, lesions- none, excoriation- none,  Lymphadenopathy- none Head- atraumatic            Eyes- Gross vision intact, PERRLA, conjunctivae- clear secretions            Ears- Hearing, canals normal            Nose- +  stuffy, no-Septal dev,  polyps, erosion, perforation             Throat- Mallampati II , mucosa  clear , drainage- none, tonsils- atrophic.  Neck- flexible , trachea midline, no stridor , thyroid nl, carotid no bruit Chest - symmetrical excursion , unlabored           Heart/CV- RRR , no murmur , no gallop  , no rub, nl s1 s2                           - JVD- none , edema- none, stasis changes- none, varices- none           Lung-    Wheeze-None , able to speak full sentences. Cough + coarse rattling                                                                      dullness-none, rub- none.            Chest wall-  Abd- Br/ Gen/ Rectal- Not done, not indicated Extrem- cyanosis- none, clubbing, none, atrophy- none, strength- nl Neuro- grossly intact to observation

## 2016-09-22 NOTE — Assessment & Plan Note (Signed)
CT chest 04/02/16 showed areas of emphysema, chronic bronchitis and bronchiectasis . Even when he feels well, he has a loose cough today with some difficulty clearing secretions. Plan-we will try to improve pulmonary toilet measures by adding a pneumatic vest.

## 2016-09-25 DIAGNOSIS — K5732 Diverticulitis of large intestine without perforation or abscess without bleeding: Secondary | ICD-10-CM | POA: Diagnosis not present

## 2016-10-15 ENCOUNTER — Telehealth: Payer: Self-pay | Admitting: Internal Medicine

## 2016-10-15 NOTE — Telephone Encounter (Signed)
Spoke with Melissa with [employer]  She states that they have been working on order for [employer] vest for pt  She told him that since he has not met his deductible, he does have some financial responsibility  They have been trying to reach him to set up a payment plan, but he is not picking up the phone, and has not called them back.  I called him to discuss and had to Manele know when/if we speak with him

## 2016-10-16 NOTE — Telephone Encounter (Signed)
lmtcb X2 for pt to make aware that [facility] is trying to reach him.

## 2016-10-17 NOTE — Telephone Encounter (Signed)
lmtcb X3 for pt.  Will close message per triage protocol.  

## 2016-10-25 ENCOUNTER — Other Ambulatory Visit: Payer: Self-pay | Admitting: Internal Medicine

## 2016-10-25 DIAGNOSIS — J449 Chronic obstructive pulmonary disease, unspecified: Secondary | ICD-10-CM

## 2016-11-13 ENCOUNTER — Other Ambulatory Visit: Payer: Self-pay | Admitting: Internal Medicine

## 2016-11-13 DIAGNOSIS — J449 Chronic obstructive pulmonary disease, unspecified: Secondary | ICD-10-CM

## 2016-12-10 DIAGNOSIS — Z23 Encounter for immunization: Secondary | ICD-10-CM | POA: Diagnosis not present

## 2016-12-15 ENCOUNTER — Telehealth: Payer: Self-pay | Admitting: Internal Medicine

## 2016-12-15 NOTE — Telephone Encounter (Signed)
Called and lmomtcb x 1 for the pt to see what other medication his insurance will cover since they will not cover the omeprazole.  Per CY---can he use an acid blocker covered by his insurance?

## 2016-12-31 ENCOUNTER — Other Ambulatory Visit: Payer: Self-pay | Admitting: Internal Medicine

## 2017-01-04 ENCOUNTER — Other Ambulatory Visit: Payer: Self-pay | Admitting: Internal Medicine

## 2017-01-05 DIAGNOSIS — K219 Gastro-esophageal reflux disease without esophagitis: Secondary | ICD-10-CM | POA: Diagnosis not present

## 2017-01-05 DIAGNOSIS — J4550 Severe persistent asthma, uncomplicated: Secondary | ICD-10-CM | POA: Diagnosis not present

## 2017-01-05 DIAGNOSIS — R739 Hyperglycemia, unspecified: Secondary | ICD-10-CM | POA: Diagnosis not present

## 2017-01-05 DIAGNOSIS — M810 Age-related osteoporosis without current pathological fracture: Secondary | ICD-10-CM | POA: Diagnosis not present

## 2017-01-12 ENCOUNTER — Other Ambulatory Visit: Payer: Self-pay

## 2017-01-12 MED ORDER — ALBUTEROL SULFATE HFA 108 (90 BASE) MCG/ACT IN AERS: INHALATION_SPRAY | RESPIRATORY_TRACT | 3 refills | Status: DC

## 2017-01-12 NOTE — Telephone Encounter (Signed)
Received refill request from [facility] on [address] for Ventolin. Per CY refill 3 inhalers x3.  Rx has been sent to preferred pharmacy.  Nothing further needed.

## 2017-02-18 ENCOUNTER — Other Ambulatory Visit: Payer: Self-pay | Admitting: Internal Medicine

## 2017-02-18 DIAGNOSIS — J449 Chronic obstructive pulmonary disease, unspecified: Secondary | ICD-10-CM

## 2017-02-18 NOTE — Telephone Encounter (Signed)
Ok to refill total 6 months 

## 2017-02-18 NOTE — Telephone Encounter (Signed)
CY please advise on refill. Thanks. 

## 2017-02-18 NOTE — Telephone Encounter (Signed)
Ok to refill x 6 months 

## 2017-02-19 NOTE — Telephone Encounter (Signed)
Called refill to [facility].

## 2017-02-25 DIAGNOSIS — L814 Other melanin hyperpigmentation: Secondary | ICD-10-CM | POA: Diagnosis not present

## 2017-02-25 DIAGNOSIS — L821 Other seborrheic keratosis: Secondary | ICD-10-CM | POA: Diagnosis not present

## 2017-02-25 DIAGNOSIS — D229 Melanocytic nevi, unspecified: Secondary | ICD-10-CM | POA: Diagnosis not present

## 2017-03-02 ENCOUNTER — Other Ambulatory Visit: Payer: Self-pay | Admitting: Internal Medicine

## 2017-03-09 DIAGNOSIS — M8588 Other specified disorders of bone density and structure, other site: Secondary | ICD-10-CM | POA: Diagnosis not present

## 2017-03-30 ENCOUNTER — Ambulatory Visit: Payer: 59 | Admitting: Internal Medicine

## 2017-03-30 ENCOUNTER — Encounter: Payer: Self-pay | Admitting: Internal Medicine

## 2017-03-30 DIAGNOSIS — J449 Chronic obstructive pulmonary disease, unspecified: Secondary | ICD-10-CM

## 2017-03-30 MED ORDER — PREDNISONE 5 MG PO TABS: ORAL_TABLET | ORAL | 5 refills | Status: DC

## 2017-03-30 NOTE — Progress Notes (Signed)
Patient ID: Eric [name], male    DOB: December 18, 1955, 62 y.o.   MRN: [id number]  HPI male never smoker followed for chronic obstructive asthma, ended Xolair 08/28/2011, hx RML resection age 45, chronic sinus disease, restless legs, complicated by GERD  Office spirometry 04/08/12- severe obstructive airways disease- FVC 2.64/58%, FEV1 1.30/36%, FEV1/FVC 0.49, FEF 25-75% 0.50/14%.    ---------------------------------------------------------------------------  09/22/16- 62 year old male never smoker followed for chronic obstructive asthma, ended Xolair 08/28/2011, history RML resection age 57, chronic sinus disease, restless legs, complicated by GERD FOLLOWS FOR: Pt states his SOB is at baseline. Pt c/o prod cough with clear mucus - also at baseline. Pt denies CP/tightness and f/c/s  We reviewed his CT scan which had shown evidence of bronchiectasis as expected. He has done what he could with chest PT and his flutter device. He agrees to try a pneumatic vest to help with secretion clearance. He has not had evidence of an allergic asthma pattern that would qualify for Xolair or Nucala. 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 Continues prednisone 10 mg daily long-term. Chest has been doing pretty well this summer but he has sustained nasal congestion. CT chest 04/02/16 1. Emphysematous changes along with areas of bronchial wall thickening possibly due to chronic reactive airways disease or bronchitis. 2. Patchy areas of subpleural atelectasis but no focal pneumonia or worrisome pulmonary lesions. 3. 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 4. No mediastinal/hilar adenopathy. ADDENDUM: Clinical concern was specifically bronchiectasis. There are a few patchy areas of minimal bronchiectasis for example right upper lobe image number 76, right middle lobe image number 96 and left  upper lobe image number 86.  03/30/17- 62 year old male never smoker followed for chronic obstructive asthma, ended Xolair 08/28/2011, history RML resection age 86, chronic sinus disease, restless legs, complicated by GERD ----Bronchiectasis: Pt states he has been doing good overall; may have had a few flares since last visit but having Doxycycline on hand helps to catch illness early. Symbicort 160, prednisone 10 mg daily, Singulair, Atrovent/ albuterol neb solution He does well taking a burst of doxycycline every few months if he feels infection starting.  Continues prednisone daily 10 mg.  Uses rescue inhaler more than nebulizer machine, but one or the other on most days.  Bone density check okay by his PCP.  Has had daily productive cough most of the last 6 months.  He chose to wait on pneumatic vest because of insurance.  I considered possibility of immotile cilia syndrome but he has natural child.  //Next visit consider CF assay//  Review of Systems-See HPI   + = pos Constitutional:   No weight loss, night sweats,  Fevers, chills, fatigue, lassitude. HEENT:   No headaches,  Difficulty swallowing,  Tooth/dental problems,  Sore throat,                No sneezing, itching, ear ache, +-nasal congestion, post nasal drip,  CV:   chest pain, orthopnea, PND, swelling in lower extremities, anasarca, dizziness, palpitations GI    Heartburn, indigestion, no-abdominal pain, nausea, vomiting, Resp:    +-productive cough,  + non-productive cough,  No coughing up of blood.  No change in color of mucus.  wheezing. +Short of breath mainly w/ exertion.  Skin: no rash or lesions. GU:  MS:  No joint pain or swelling. Marland Kitchen Psych:  No change in mood or affect. No depression or anxiety.  No memory loss.  Objective:   Physical Exam General- Alert, Oriented, Affect-appropriate, Distress- none acute,  Looks well-Not cushingoid  Skin- rash-none, lesions- none, excoriation- none,   Lymphadenopathy- none Head- atraumatic            Eyes- Gross vision intact, PERRLA, conjunctivae- clear secretions            Ears- Hearing, canals normal            Nose- +  stuffy, no-Septal dev,  polyps, erosion, perforation             Throat- Mallampati II , mucosa clear , drainage- none, tonsils- atrophic.  Neck- flexible , trachea midline, no stridor , thyroid nl, carotid no bruit Chest - symmetrical excursion , unlabored           Heart/CV- RRR , no murmur , no gallop  , no rub, nl s1 s2                           - JVD- none , edema- none, stasis changes- none, varices- none           Lung-    Wheeze-None , able to speak full sentences. Cough + coarse rattling, + bilateral rhonchi                                            dullness-none, rub- none.            Chest wall-  Abd- Br/ Gen/ Rectal- Not done, not indicated Extrem- cyanosis- none, clubbing, none, atrophy- none, strength- nl Neuro- grossly intact to observation

## 2017-03-30 NOTE — Patient Instructions (Addendum)
Script sent refilling prednisone, changing to 5 mg tabs to make it easier to try reducing dose to alternate 10 mg with 5 mg every other day.  Please call if we can help

## 2017-04-10 ENCOUNTER — Other Ambulatory Visit: Payer: Self-pay | Admitting: Internal Medicine

## 2017-04-10 NOTE — Telephone Encounter (Signed)
Spoke with Mickel Baas and she is requesting a refill for Doxycycline. She states CY agreed to keep a RX on hand to take if pt was developing symptoms. She did not want to answer the questions and stated CY knows why this is prescribed to prevent infection. CY please advise. I did explain it would be Monday since no doctors are here to approve Rx.   Current Outpatient Medications on File Prior to Visit  Medication Sig Dispense Refill  . albuterol (PROVENTIL) (2.5 MG/3ML) 0.083% nebulizer solution USE 1 VIAL VIA NEBULIZER 3  TO 4 TIMES DAILY 1125 mL 1  . albuterol (VENTOLIN HFA) 108 (90 Base) MCG/ACT inhaler 2 puffs four times daily as needed 3 Inhaler 3  . ALPRAZolam (XANAX) 0.5 MG tablet TAKE 1 TABLET BY MOUTH  TWICE A DAY AS NEEDED 180 tablet 1  . Calcium Carbonate-Vitamin D (CALCIUM 600+D) 600-200 MG-UNIT TABS Take 1 tablet by mouth 2 (two) times daily.      . citalopram (CELEXA) 40 MG tablet Take 40 mg by mouth daily.  3  . fluticasone (FLONASE) 50 MCG/ACT nasal spray Use 1 to 2 sprays in each  nostril daily at bedtime as needed 48 g 3  . ipratropium (ATROVENT) 0.02 % nebulizer solution USE 1 VIAL VIA NEBULIZER  EVERY 6 HOURS (Patient taking differently: USE 1 VIAL VIA NEBULIZER  EVERY 6 HOURS PRN) 937.5 mL 6  . Magnesium Cl-Calcium Carbonate (SLOW-MAG PO) Take 2 tablets by mouth daily.    . montelukast (SINGULAIR) 10 MG tablet TAKE 1 TABLET BY MOUTH AT  BEDTIME 90 tablet 3  . Nebulizers (COMPRESSOR NEBULIZER) MISC 1 Device by Does not apply route once. 1 each 0  . omeprazole (PRILOSEC) 20 MG capsule TAKE 1 CAPSULE BY MOUTH TWO TIMES DAILY 180 capsule 1  . predniSONE (DELTASONE) 5 MG tablet 1 or 2 daily 60 tablet 5  . Respiratory Therapy Supplies (FLUTTER) DEVI Blow through 3 times per set, three times daily 1 each 0  . risedronate (ACTONEL) 150 MG tablet Take 1 tablet by mouth daily.    . SYMBICORT 160-4.5 MCG/ACT inhaler USE 2 PUFFS TWO TIMES DAILY  RINSE MOUTH AFTER 30.6 g 6   No current  facility-administered medications on file prior to visit.    Allergies  Allergen Reactions  . Erythromycin Ethylsuccinate     REACTION: unspecified  . Penicillins

## 2017-04-12 NOTE — Telephone Encounter (Signed)
Ok doxycycline 100 mg, # 20, 1 twice daily      Ref x 4

## 2017-04-13 ENCOUNTER — Other Ambulatory Visit: Payer: Self-pay | Admitting: Internal Medicine

## 2017-04-13 NOTE — Telephone Encounter (Signed)
I okayed this already- doxycycline 100 mg, # 20, 1 twice daily,  Ref x 4

## 2017-04-13 NOTE — Telephone Encounter (Signed)
Spoke with EC. She stated that she was calling for a refill on patient's doxycyline 100mg . She stated that the patient normally gets refills to have on hand in case he develops a URI.   She stated that the patient started feeling bad on Thursday of last week. Productive cough with clear mucus. Denies any body aches or fever.   [facility] [address]   Looked in patient's chart and found this on the Doxy: This was from 03/17/16  Dose: 100 mg Route: Oral Frequency: 2 times daily  Dispense Quantity: 20 tablet Refills: 12 Fills remaining: --        Sig: Take 1 tablet (100 mg total) by mouth 2 (two) times daily.   CY, please advise. Thanks!

## 2017-04-13 NOTE — Telephone Encounter (Signed)
Attempted to contact pt's wife. There was no answer and I could not leave a message. Will try back.

## 2017-04-13 NOTE — Telephone Encounter (Signed)
Attempted to contact pt's wife. There was no answer. Will try back.

## 2017-04-14 MED ORDER — DOXYCYCLINE HYCLATE 100 MG PO TABS: 100.0000 mg | ORAL_TABLET | Freq: Two times a day (BID) | ORAL | 4 refills | Status: DC

## 2017-04-14 NOTE — Telephone Encounter (Signed)
lmtcb x2 for Mickel Baas

## 2017-04-14 NOTE — Telephone Encounter (Signed)
Called wife back, advised that we are sending in prescription to verified pharmacy. Nothing further needed.

## 2017-04-14 NOTE — Telephone Encounter (Signed)
The wife is calling back [phone]

## 2017-04-15 NOTE — Telephone Encounter (Signed)
April 14, 2017  Cox, Berline Lopes, CMA      10:30 AM  Note    Called wife back, advised that we are sending in prescription to verified pharmacy. Nothing further needed

## 2017-05-19 NOTE — Assessment & Plan Note (Signed)
He required a lobectomy at age 62, suggesting childhood aspiration or major infection event, or congenital problem.  He has not had catastrophic progression as might be expected from CF presenting at that age.  He has had natural child making immotile cilia syndrome unlikely. Plan-manage as bronchiectasis with emphasis always on airway clearance.  Refill prednisone since that has improved quality of life and bone density is being maintained.

## 2017-06-23 ENCOUNTER — Other Ambulatory Visit: Payer: Self-pay | Admitting: Internal Medicine

## 2017-07-30 ENCOUNTER — Telehealth: Payer: Self-pay | Admitting: Internal Medicine

## 2017-07-30 NOTE — Telephone Encounter (Signed)
PA initiated on [email or web address] today. Key: [id number] - PA Case ID: [id number]  It takes 7-10 business days for decision of approval or denial.

## 2017-08-07 NOTE — Telephone Encounter (Signed)
Checked Cover My Meds. PA was not needed at this time. Omeprazole is on the pt's plan's covered drug list. Nothing further was needed.

## 2017-09-21 ENCOUNTER — Telehealth: Payer: Self-pay | Admitting: Internal Medicine

## 2017-09-21 NOTE — Telephone Encounter (Signed)
Forms placed on CY's cart to complete.  

## 2017-09-22 NOTE — Telephone Encounter (Signed)
Rec'd completed forms from [place] to [employer] via [employer] -pr

## 2017-09-29 ENCOUNTER — Other Ambulatory Visit (INDEPENDENT_AMBULATORY_CARE_PROVIDER_SITE_OTHER): Payer: 59

## 2017-09-29 ENCOUNTER — Encounter: Payer: Self-pay | Admitting: Internal Medicine

## 2017-09-29 ENCOUNTER — Ambulatory Visit: Payer: 59 | Admitting: Internal Medicine

## 2017-09-29 VITALS — BP 120/72 | HR 82 | Ht 68.0 in | Wt 166.4 lb

## 2017-09-29 DIAGNOSIS — J324 Chronic pansinusitis: Secondary | ICD-10-CM | POA: Diagnosis not present

## 2017-09-29 DIAGNOSIS — J449 Chronic obstructive pulmonary disease, unspecified: Secondary | ICD-10-CM

## 2017-09-29 LAB — CBC WITH DIFFERENTIAL/PLATELET
BASOS ABS: 0.2 10*3/uL — AB (ref 0.0–0.1)
BASOS PCT: 3 % (ref 0.0–3.0)
EOS ABS: 0.8 10*3/uL — AB (ref 0.0–0.7)
Eosinophils Relative: 9.3 % — ABNORMAL HIGH (ref 0.0–5.0)
HCT: 47.4 % (ref 39.0–52.0)
Hemoglobin: 16 g/dL (ref 13.0–17.0)
LYMPHS ABS: 2.1 10*3/uL (ref 0.7–4.0)
Lymphocytes Relative: 26.3 % (ref 12.0–46.0)
MCHC: 33.8 g/dL (ref 30.0–36.0)
MCV: 93.4 fl (ref 78.0–100.0)
MONO ABS: 0.9 10*3/uL (ref 0.1–1.0)
Monocytes Relative: 11.3 % (ref 3.0–12.0)
NEUTROS ABS: 4.1 10*3/uL (ref 1.4–7.7)
Neutrophils Relative %: 50.1 % (ref 43.0–77.0)
Platelets: 239 10*3/uL (ref 150.0–400.0)
RBC: 5.08 Mil/uL (ref 4.22–5.81)
RDW: 13.9 % (ref 11.5–15.5)
WBC: 8.2 10*3/uL (ref 4.0–10.5)

## 2017-09-29 MED ORDER — FLUTICASONE-UMECLIDIN-VILANT 100-62.5-25 MCG/INH IN AEPB: 1.0000 | INHALATION_SPRAY | Freq: Every day | RESPIRATORY_TRACT | 0 refills | Status: DC

## 2017-09-29 MED ORDER — ALBUTEROL SULFATE HFA 108 (90 BASE) MCG/ACT IN AERS: INHALATION_SPRAY | RESPIRATORY_TRACT | 3 refills | Status: DC

## 2017-09-29 NOTE — Progress Notes (Signed)
Patient ID: Eric [name], male    DOB: April 18, 1955, 62 y.o.   MRN: [id number]  HPI male never smoker followed for chronic obstructive asthma, ended Xolair 08/28/2011, hx RML resection age 75, chronic sinus disease, restless legs, complicated by GERD  CF Sweat Chloride Test  Negative (remote) Office spirometry 04/08/12- severe obstructive airways disease- FVC 2.64/58%, FEV1 1.30/36%, FEV1/FVC 0.49, FEF 25-75% 0.50/14%.   CT chest 04/01/2016 showed emphysema and scarring with chronic bronchitis changes but no ILD and no bronchiectasis ---------------------------------------------------------------------------  03/30/17- 62 year old male never smoker followed for chronic obstructive asthma, ended Xolair 08/28/2011, history RML resection age 57, chronic sinus disease, restless legs, complicated by GERD ---- Pt states he has been doing good overall; may have had a few flares since last visit but having Doxycycline on hand helps to catch illness early. Symbicort 160, prednisone 10 mg daily, Singulair, Atrovent/ albuterol neb solution He does well taking a burst of doxycycline every few months if he feels infection starting.  Continues prednisone daily 10 mg.  Uses rescue inhaler more than nebulizer machine, but one or the other on most days.  Bone density check okay by his PCP.  Has had daily productive cough most of the last 6 months.  He chose to wait on pneumatic vest because of insurance.  I considered possibility of immotile cilia syndrome but he has natural child.  09/29/2017-  62 year old male never smoker followed for COPD mixed type, ended Xolair 08/28/2011, history RML resection age 72, chronic sinus disease, restless legs, complicated by GERD -----COPD, productive cough w/ clear mucus, wheezing Symbicort 160, prednisone 10 mg daily, Singulair, Atrovent/ albuterol neb solution, Ventolin,  Prednisone alt 5 w 10 mg qod For the last 2 or 3 months, usually dry or with sense that he has some mucus hung  up at level of carina.  Occasionally uses nebulizer machine if needed.  Feels he is doing well "for me" now with no recent acute exacerbations.  We discussed his long history of recurrent sinus infections especially in high school.  Review of Systems-See HPI   + = positive Constitutional:   No weight loss, night sweats,  Fevers, chills, fatigue, lassitude. HEENT:   No headaches,  Difficulty swallowing,  Tooth/dental problems,  Sore throat,                No sneezing, itching, ear ache, +-nasal congestion, post nasal drip,  CV:   chest pain, orthopnea, PND, swelling in lower extremities, anasarca, dizziness, palpitations GI    Heartburn, indigestion, no-abdominal pain, nausea, vomiting, Resp:    +-productive cough,  + non-productive cough,  No coughing up of blood.  No change in color of mucus.                                       wheezing. +Short of breath mainly w/ exertion.  Skin: no rash or lesions. GU:  MS:  No joint pain or swelling. Marland Kitchen Psych:  No change in mood or affect. No depression or anxiety.  No memory loss.  Objective:   Physical Exam General- Alert, Oriented, Affect-appropriate, Distress- none acute,  Looks well-Not cushingoid  Skin- rash-none, lesions- none, excoriation- none,  Lymphadenopathy- none Head- atraumatic            Eyes- Gross vision intact, PERRLA, conjunctivae- clear secretions            Ears- Hearing, canals normal  Nose- +  stuffy, no-Septal dev,  polyps, erosion, perforation             Throat- Mallampati II , mucosa clear , drainage- none, tonsils- atrophic.  Neck- flexible , trachea midline, no stridor , thyroid nl, carotid no bruit Chest - symmetrical excursion , unlabored           Heart/CV- RRR , no murmur , no gallop  , no rub, nl s1 s2                           - JVD- none , edema- none, stasis changes- none, varices- none           Lung-    Wheeze + , able to speak full sentences. Cough - none, + bilateral rhonchi                                             dullness-none, rub- none.            Chest wall-  Abd- Br/ Gen/ Rectal- Not done, not indicated Extrem- cyanosis- none, clubbing, none, atrophy- none, strength- nl Neuro- grossly intact to observation

## 2017-09-29 NOTE — Patient Instructions (Signed)
Refill script sent for Ventolin rescue inhaler  Sample Trelegy inhaler    Inhale 1 puff, then rinse mouth, once daily.   Try this instead of Symbicort. When it runs out, go back to Symbicort for comparison.   Order- lab- IgE, CBC w diff   Dx COPD mixed type

## 2017-09-29 NOTE — Assessment & Plan Note (Signed)
Asthma-COPD overlap syndrome Plan-sample Trelegy for trial to compare with Symbicort

## 2017-09-29 NOTE — Assessment & Plan Note (Signed)
Mild audible stuffiness at this visit without visible polyps.  He feels this problem has done better in recent years.

## 2017-09-30 LAB — IGE: IgE (Immunoglobulin E), Serum: 190 kU/L — ABNORMAL HIGH (ref <=114)

## 2017-10-14 ENCOUNTER — Telehealth: Payer: Self-pay | Admitting: Internal Medicine

## 2017-10-14 NOTE — Telephone Encounter (Signed)
Eric [name] called from [place] - she states that there is a pending authorization - auth# [id number] - they are needing clinical notes, labs, and dosage and frequency for Fasenra . She can be reached at [phone] no extension. Info should be faxed to [phone]. -pr

## 2017-10-14 NOTE — Telephone Encounter (Signed)
Forms received and placed in Eric [name]'s folder

## 2017-10-14 NOTE — Telephone Encounter (Signed)
Returned [employer]. I sent everything she asked for. I also asked her to let me know if she needs anything else. Waiting on approval or denial.

## 2017-10-14 NOTE — Telephone Encounter (Signed)
I checked up front fax, [name] and [name]'s area and do not see anything on this pt  Called [place] [employer] and spoke with Rollene Fare  She is asking if we have the benefits statement on Fasenra  I advised we do not  She is going to refax it to the up front fax  Will await fax and then give to [facility]

## 2017-10-19 NOTE — Telephone Encounter (Signed)
Routing message to TS for update.

## 2017-10-19 NOTE — Telephone Encounter (Signed)
Called pt back, spoke with his wife and lm with her. I called [employer], I had to leave a message for [place]. I asked her to call me back and let me know the status of the P/A. I sent her clinical notes labs, med list with tried and failed meds and of course the enrollment form. I have not received a fax or call from [facility] or [place].

## 2017-10-19 NOTE — Telephone Encounter (Signed)
Patient calling to check on status of Eric [name] - pt can be reached at [phone]

## 2017-10-20 NOTE — Telephone Encounter (Signed)
Eric [name] just called. Pt was approved for Fasenra 30mg  Give SubQ q 28days for the first 3 months. And for Fasenra 30mg  Given SubQ q 8 wks. Ref# [id number]  Effective dates: 10/13/17 to 04/15/2018 with a total of 5 refills.  Called pt lmom to make him aware. I called [employer] to order pt's [name] the rep asked me if the p/a was medical or pharmacy, Medical. P/A is no good because it has to be under his pharmacy benefit. I did a verbal P/A, it was approved. Ref. #: [id number]  Effective dates: 10/20/17 thru 04/22/2018 Pt has a $100  I called [name] back to see if [facility] can be of assistance with pt's co-pay. I also let her know I gave pt [employer]'s # and I gave [name] pt's # as well in case she needs to speak to him. Will wait for call from pt and or [employer].

## 2017-10-20 NOTE — Telephone Encounter (Signed)
Pt is calling Eric [name] back about the [place]  [phone]

## 2017-10-23 ENCOUNTER — Telehealth: Payer: Self-pay | Admitting: Internal Medicine

## 2017-10-23 MED ORDER — EPINEPHRINE 0.3 MG/0.3ML IJ SOAJ: 0.3000 mg | Freq: Once | INTRAMUSCULAR | 0 refills | Status: DC

## 2017-10-23 MED ORDER — EPINEPHRINE 0.3 MG/0.3ML IJ SOAJ: 0.3000 mg | Freq: Once | INTRAMUSCULAR | 11 refills | Status: DC

## 2017-10-23 NOTE — Telephone Encounter (Signed)
Routing message to TS for update.

## 2017-10-23 NOTE — Telephone Encounter (Signed)
Ok Rx Epipen   Generic ok   Inject into thigh for severe allergic reaction. Bring it each time for Fasenra injection.

## 2017-10-23 NOTE — Telephone Encounter (Signed)
1 prefilled syringe Ordered date: 10/23/17 Shipping date: 10/26/17

## 2017-10-23 NOTE — Telephone Encounter (Signed)
Rx sent 

## 2017-10-23 NOTE — Telephone Encounter (Signed)
Patient called stating that he is starting [name] next week and that he was told he would need a Epi pen before starting the first dose.  [facility] [address]    Dr. Annamaria Boots, please advise

## 2017-10-23 NOTE — Telephone Encounter (Signed)
Rx has been sent in. Pt is aware and is aware that he will need to bring this with him to his first injection. Nothing further was needed.

## 2017-10-23 NOTE — Telephone Encounter (Signed)
I just spoke with pt's wife, pt was able to get co-pay asst.. He called [employer] this morning gave them the info they needed and they told him he was good to go on his co-pay. His wife said del had already been set up for 10/27/17. Carley Hammed has not called me to confirm del.. I called and confirmed the delivery for 10/27/17. Will put order in [place] text. Nothing further needed.

## 2017-10-27 NOTE — Telephone Encounter (Signed)
Arrival Date:10/27/17 Lot #: [id number] Exp date: 09/2018

## 2017-11-02 ENCOUNTER — Ambulatory Visit (INDEPENDENT_AMBULATORY_CARE_PROVIDER_SITE_OTHER): Payer: 59

## 2017-11-02 DIAGNOSIS — J4550 Severe persistent asthma, uncomplicated: Secondary | ICD-10-CM | POA: Diagnosis not present

## 2017-11-02 MED ORDER — BENRALIZUMAB 30 MG/ML ~~LOC~~ SOSY
30.0000 mg | PREFILLED_SYRINGE | Freq: Once | SUBCUTANEOUS | Status: AC
  Administered 2017-11-02: 30 mg via SUBCUTANEOUS

## 2017-11-02 NOTE — Progress Notes (Signed)
Documentation of medication administration and charges of [name] have been completed by Desmond Dike, CMA based on the [facility] documentation sheet completed by Alroy Bailiff, who administered the medication.

## 2017-11-10 ENCOUNTER — Other Ambulatory Visit: Payer: Self-pay | Admitting: Internal Medicine

## 2017-11-10 DIAGNOSIS — J449 Chronic obstructive pulmonary disease, unspecified: Secondary | ICD-10-CM

## 2017-11-11 NOTE — Telephone Encounter (Signed)
Ok to refill 

## 2017-11-11 NOTE — Telephone Encounter (Signed)
Please advise if okay to refill alprazolam 0.5 mg Pt had had this [facility] 180 tabs no rf on 02/19/17  Last ov 03/30/17  Next ov 04/01/18

## 2017-11-11 NOTE — Telephone Encounter (Signed)
rx called in

## 2017-11-12 ENCOUNTER — Other Ambulatory Visit: Payer: Self-pay | Admitting: Internal Medicine

## 2017-11-12 MED ORDER — ALBUTEROL SULFATE HFA 108 (90 BASE) MCG/ACT IN AERS: INHALATION_SPRAY | RESPIRATORY_TRACT | 3 refills | Status: DC

## 2017-11-16 DIAGNOSIS — Z23 Encounter for immunization: Secondary | ICD-10-CM | POA: Diagnosis not present

## 2017-11-25 DIAGNOSIS — D1801 Hemangioma of skin and subcutaneous tissue: Secondary | ICD-10-CM | POA: Diagnosis not present

## 2017-11-25 DIAGNOSIS — L821 Other seborrheic keratosis: Secondary | ICD-10-CM | POA: Diagnosis not present

## 2017-11-25 DIAGNOSIS — C44519 Basal cell carcinoma of skin of other part of trunk: Secondary | ICD-10-CM | POA: Diagnosis not present

## 2017-11-25 DIAGNOSIS — D485 Neoplasm of uncertain behavior of skin: Secondary | ICD-10-CM | POA: Diagnosis not present

## 2017-11-25 DIAGNOSIS — L814 Other melanin hyperpigmentation: Secondary | ICD-10-CM | POA: Diagnosis not present

## 2017-11-27 ENCOUNTER — Telehealth: Payer: Self-pay | Admitting: Internal Medicine

## 2017-11-27 ENCOUNTER — Other Ambulatory Visit: Payer: Self-pay | Admitting: Internal Medicine

## 2017-11-27 DIAGNOSIS — J449 Chronic obstructive pulmonary disease, unspecified: Secondary | ICD-10-CM

## 2017-11-27 NOTE — Telephone Encounter (Signed)
1 prefilled syringe Ordered date: 11/27/17 Shipping date: 11/30/17

## 2017-11-30 ENCOUNTER — Ambulatory Visit: Payer: 59

## 2017-12-01 ENCOUNTER — Ambulatory Visit (INDEPENDENT_AMBULATORY_CARE_PROVIDER_SITE_OTHER): Payer: 59

## 2017-12-01 DIAGNOSIS — J4550 Severe persistent asthma, uncomplicated: Secondary | ICD-10-CM | POA: Diagnosis not present

## 2017-12-01 MED ORDER — BENRALIZUMAB 30 MG/ML ~~LOC~~ SOSY
30.0000 mg | PREFILLED_SYRINGE | Freq: Once | SUBCUTANEOUS | Status: AC
Start: 2017-12-01 — End: 2017-12-01
  Administered 2017-12-01: 30 mg via SUBCUTANEOUS

## 2017-12-01 NOTE — Telephone Encounter (Signed)
Arrival Date:12/01/17 Lot #: [id number] Exp date: 08/2019

## 2017-12-01 NOTE — Progress Notes (Signed)
Documentation of medication administration and charges of Fasenra have been completed by Lindsay Lemons, CMA based on the hand written Fasenra documentation sheet completed by Tammy Scott, who administered the medication.  

## 2017-12-16 ENCOUNTER — Other Ambulatory Visit: Payer: Self-pay | Admitting: Internal Medicine

## 2017-12-16 DIAGNOSIS — J449 Chronic obstructive pulmonary disease, unspecified: Secondary | ICD-10-CM

## 2017-12-16 MED ORDER — OMEPRAZOLE 20 MG PO CPDR: DELAYED_RELEASE_CAPSULE | ORAL | 1 refills | Status: DC

## 2017-12-28 DIAGNOSIS — C44519 Basal cell carcinoma of skin of other part of trunk: Secondary | ICD-10-CM | POA: Diagnosis not present

## 2017-12-28 DIAGNOSIS — L570 Actinic keratosis: Secondary | ICD-10-CM | POA: Diagnosis not present

## 2017-12-29 ENCOUNTER — Telehealth: Payer: Self-pay | Admitting: Internal Medicine

## 2017-12-29 NOTE — Telephone Encounter (Addendum)
Also calling fasenra rx for maint. Dose. Rep I set pt's order with is transferring me to the pharm. Rep came back and told me pt. Did have 6 refills as I said.

## 2017-12-29 NOTE — Telephone Encounter (Signed)
1 prefilled syringe Ordered date: 12/29/17 Shipping date: 12/30/17

## 2017-12-31 NOTE — Telephone Encounter (Signed)
Arrival Date:12/31/17 Lot #: [id number] Exp date: 08/2019

## 2018-01-01 ENCOUNTER — Telehealth: Payer: Self-pay | Admitting: Internal Medicine

## 2018-01-01 NOTE — Telephone Encounter (Signed)
Called pt advised him to keep his appt. Mon.. He was thinking he was on maintenance alredy. I explained to him he will be q 8 wks. After his  inj. 01/04/18.

## 2018-01-01 NOTE — Telephone Encounter (Signed)
Pt understood nothing further needed.

## 2018-01-04 ENCOUNTER — Ambulatory Visit (INDEPENDENT_AMBULATORY_CARE_PROVIDER_SITE_OTHER): Payer: 59

## 2018-01-04 ENCOUNTER — Telehealth: Payer: Self-pay | Admitting: Internal Medicine

## 2018-01-04 DIAGNOSIS — J4550 Severe persistent asthma, uncomplicated: Secondary | ICD-10-CM

## 2018-01-04 MED ORDER — BENRALIZUMAB 30 MG/ML ~~LOC~~ SOSY
30.0000 mg | PREFILLED_SYRINGE | Freq: Once | SUBCUTANEOUS | Status: AC
Start: 2018-01-04 — End: 2018-01-04
  Administered 2018-01-04: 30 mg via SUBCUTANEOUS

## 2018-01-04 MED ORDER — PREDNISONE 1 MG PO TABS: 1.0000 mg | ORAL_TABLET | Freq: Every day | ORAL | 2 refills | Status: DC

## 2018-01-04 NOTE — Telephone Encounter (Signed)
Spoke with patient. He is aware of the refill. Will go ahead and sent in RX for patient. Nothing further needed at time of call.

## 2018-01-04 NOTE — Progress Notes (Signed)
Documentation of medication administration and charges of Fasenra have been completed by Lindsay Lemons, CMA based on the hand written Fasenra documentation sheet completed by Tammy Scott, who administered the medication.  

## 2018-01-04 NOTE — Telephone Encounter (Signed)
Called and spoke with pt who stated he was needing a new Rx of pred 1mg  tablet sent in to his pharmacy.  Per pt's med list, we only have 5mg  pred listed.  Pt stated he is wanting to take 6mg  prednisone and states due to this, he was needing an Rx of 1mg  prednisone sent to his pharmacy as he states he cannot break a 5mg  tablet down anymore to make the dosage that he is needing.  Dr. Annamaria Boots, please advise if this is okay for [place] to send in for pt. Thanks!  Allergies  Allergen Reactions  . Erythromycin Ethylsuccinate     REACTION: unspecified  . Penicillins      Current Outpatient Medications:  .  albuterol (PROVENTIL) (2.5 MG/3ML) 0.083% nebulizer solution, USE 1 VIAL VIA NEBULIZER 3  TO 4 TIMES DAILY, Disp: 1125 mL, Rfl: 1 .  albuterol (VENTOLIN HFA) 108 (90 Base) MCG/ACT inhaler, 2 puffs four times daily as needed, Disp: 3 Inhaler, Rfl: 3 .  ALPRAZolam (XANAX) 0.5 MG tablet, TAKE 1 TABLET BY MOUTH  TWICE A DAY AS NEEDED, Disp: 180 tablet, Rfl: 0 .  Calcium Carbonate-Vitamin D (CALCIUM 600+D) 600-200 MG-UNIT TABS, Take 1 tablet by mouth 2 (two) times daily.  , Disp: , Rfl:  .  citalopram (CELEXA) 20 MG tablet, Take 20 mg by mouth daily., Disp: , Rfl:  .  fluticasone (FLONASE) 50 MCG/ACT nasal spray, Use 1 to 2 sprays in each  nostril daily at bedtime as needed, Disp: 48 g, Rfl: 3 .  Fluticasone-Umeclidin-Vilant (TRELEGY ELLIPTA) 100-62.5-25 MCG/INH AEPB, Inhale 1 puff into the lungs daily., Disp: 1 each, Rfl: 0 .  ipratropium (ATROVENT) 0.02 % nebulizer solution, USE 1 VIAL VIA NEBULIZER  EVERY 6 HOURS (Patient taking differently: USE 1 VIAL VIA NEBULIZER  EVERY 6 HOURS PRN), Disp: 937.5 mL, Rfl: 6 .  Magnesium Cl-Calcium Carbonate (SLOW-MAG PO), Take 2 tablets by mouth daily., Disp: , Rfl:  .  montelukast (SINGULAIR) 10 MG tablet, TAKE 1 TABLET BY MOUTH AT  BEDTIME, Disp: 90 tablet, Rfl: 3 .  Nebulizers (COMPRESSOR NEBULIZER) MISC, 1 Device by Does not apply route once., Disp: 1 each, Rfl:  0 .  omeprazole (PRILOSEC) 20 MG capsule, TAKE 1 CAPSULE BY MOUTH TWO TIMES DAILY, Disp: 180 capsule, Rfl: 1 .  predniSONE (DELTASONE) 5 MG tablet, 1 or 2 daily (Patient taking differently: Alternate 5mg  daily and 10mg  the next day), Disp: 60 tablet, Rfl: 5 .  Respiratory Therapy Supplies (FLUTTER) DEVI, Blow through 3 times per set, three times daily, Disp: 1 each, Rfl: 0 .  risedronate (ACTONEL) 150 MG tablet, Take 1 tablet by mouth daily., Disp: , Rfl:  .  SYMBICORT 160-4.5 MCG/ACT inhaler, USE 2 PUFFS TWO TIMES DAILY RINSE MOUTH AFTER, Disp: 30.6 g, Rfl: 6

## 2018-01-04 NOTE — Telephone Encounter (Signed)
Ok to add script for prednisone 1 mg tabs, # 50    1 daily or as directed, ref x 2

## 2018-01-06 ENCOUNTER — Telehealth: Payer: Self-pay | Admitting: Internal Medicine

## 2018-01-06 DIAGNOSIS — Z125 Encounter for screening for malignant neoplasm of prostate: Secondary | ICD-10-CM | POA: Diagnosis not present

## 2018-01-06 DIAGNOSIS — K219 Gastro-esophageal reflux disease without esophagitis: Secondary | ICD-10-CM | POA: Diagnosis not present

## 2018-01-06 DIAGNOSIS — E2740 Unspecified adrenocortical insufficiency: Secondary | ICD-10-CM | POA: Diagnosis not present

## 2018-01-06 DIAGNOSIS — Z0000 Encounter for general adult medical examination without abnormal findings: Secondary | ICD-10-CM | POA: Diagnosis not present

## 2018-01-06 DIAGNOSIS — J4550 Severe persistent asthma, uncomplicated: Secondary | ICD-10-CM | POA: Diagnosis not present

## 2018-01-06 DIAGNOSIS — Z13220 Encounter for screening for lipoid disorders: Secondary | ICD-10-CM | POA: Diagnosis not present

## 2018-01-06 NOTE — Telephone Encounter (Signed)
Called patient unable to reach left message to give us a call back.

## 2018-01-06 NOTE — Telephone Encounter (Signed)
Called and spoke to pt. Pt states he has been taking 7.5mg  of pred daily for the past month, he just began taking 6mg  today. Pt is requesting a regime on how to taper his prednisone. Pt was advised pt CY not to go below 5mg  daily.   Dr. Annamaria Boots please advise. Thanks  Allergies  Allergen Reactions  . Erythromycin Ethylsuccinate     REACTION: unspecified  . Penicillins    Current Outpatient Medications on File Prior to Visit  Medication Sig Dispense Refill  . albuterol (PROVENTIL) (2.5 MG/3ML) 0.083% nebulizer solution USE 1 VIAL VIA NEBULIZER 3  TO 4 TIMES DAILY 1125 mL 1  . albuterol (VENTOLIN HFA) 108 (90 Base) MCG/ACT inhaler 2 puffs four times daily as needed 3 Inhaler 3  . ALPRAZolam (XANAX) 0.5 MG tablet TAKE 1 TABLET BY MOUTH  TWICE A DAY AS NEEDED 180 tablet 0  . Calcium Carbonate-Vitamin D (CALCIUM 600+D) 600-200 MG-UNIT TABS Take 1 tablet by mouth 2 (two) times daily.      . citalopram (CELEXA) 20 MG tablet Take 20 mg by mouth daily.    . fluticasone (FLONASE) 50 MCG/ACT nasal spray Use 1 to 2 sprays in each  nostril daily at bedtime as needed 48 g 3  . Fluticasone-Umeclidin-Vilant (TRELEGY ELLIPTA) 100-62.5-25 MCG/INH AEPB Inhale 1 puff into the lungs daily. 1 each 0  . ipratropium (ATROVENT) 0.02 % nebulizer solution USE 1 VIAL VIA NEBULIZER  EVERY 6 HOURS (Patient taking differently: USE 1 VIAL VIA NEBULIZER  EVERY 6 HOURS PRN) 937.5 mL 6  . Magnesium Cl-Calcium Carbonate (SLOW-MAG PO) Take 2 tablets by mouth daily.    . montelukast (SINGULAIR) 10 MG tablet TAKE 1 TABLET BY MOUTH AT  BEDTIME 90 tablet 3  . Nebulizers (COMPRESSOR NEBULIZER) MISC 1 Device by Does not apply route once. 1 each 0  . omeprazole (PRILOSEC) 20 MG capsule TAKE 1 CAPSULE BY MOUTH TWO TIMES DAILY 180 capsule 1  . predniSONE (DELTASONE) 1 MG tablet Take 1 tablet (1 mg total) by mouth daily with breakfast. 50 tablet 2  . predniSONE (DELTASONE) 5 MG tablet 1 or 2 daily (Patient taking differently: Alternate 5mg   daily and 10mg  the next day) 60 tablet 5  . Respiratory Therapy Supplies (FLUTTER) DEVI Blow through 3 times per set, three times daily 1 each 0  . risedronate (ACTONEL) 150 MG tablet Take 1 tablet by mouth daily.    . SYMBICORT 160-4.5 MCG/ACT inhaler USE 2 PUFFS TWO TIMES DAILY RINSE MOUTH AFTER 30.6 g 6   No current facility-administered medications on file prior to visit.

## 2018-01-06 NOTE — Telephone Encounter (Signed)
Suggest drop by 0.5 mg daily, per week.

## 2018-01-07 NOTE — Telephone Encounter (Signed)
Called pt and advised message from the provider. Pt understood and verbalized understanding. Nothing further is needed.    

## 2018-01-08 ENCOUNTER — Other Ambulatory Visit: Payer: Self-pay | Admitting: Internal Medicine

## 2018-02-01 ENCOUNTER — Telehealth: Payer: Self-pay | Admitting: Internal Medicine

## 2018-02-01 MED ORDER — OSELTAMIVIR PHOSPHATE 75 MG PO CAPS: 75.0000 mg | ORAL_CAPSULE | Freq: Every day | ORAL | 0 refills | Status: DC

## 2018-02-01 NOTE — Telephone Encounter (Signed)
Spoke with pt's wife (dpr on file), states that she was dx'ed today with Flu A, and that her doctor recommended that pt be placed on prophylactic flu treatment.  Pt is not currently exhibiting any s/s.   Pharmacy: [facility] [address].   CY please advise on recs.  Thanks!

## 2018-02-01 NOTE — Telephone Encounter (Signed)
Called and spoke with patient, medication has been sent in. Nothing further needed.

## 2018-02-01 NOTE — Telephone Encounter (Signed)
Offer Tamiflu 75 mg, # 10, 1 daily for flu prophyllaxis

## 2018-03-04 ENCOUNTER — Telehealth: Payer: Self-pay | Admitting: Internal Medicine

## 2018-03-04 NOTE — Telephone Encounter (Signed)
Returned pt's call, He's coming in [place]. At 11:30 for his Fasenra inj.. Nothing further needed.

## 2018-03-05 NOTE — Telephone Encounter (Signed)
1 prefilled syringe Ordered date: 03/04/18 Shipping date: 03/04/18

## 2018-03-05 NOTE — Telephone Encounter (Signed)
Arrival Date:02/2718 Lot #: [id number] Exp date: 10/2019

## 2018-03-08 ENCOUNTER — Ambulatory Visit (INDEPENDENT_AMBULATORY_CARE_PROVIDER_SITE_OTHER): Payer: 59

## 2018-03-08 DIAGNOSIS — J4550 Severe persistent asthma, uncomplicated: Secondary | ICD-10-CM

## 2018-03-08 MED ORDER — BENRALIZUMAB 30 MG/ML ~~LOC~~ SOSY
30.0000 mg | PREFILLED_SYRINGE | Freq: Once | SUBCUTANEOUS | Status: AC
  Administered 2018-03-08: 30 mg via SUBCUTANEOUS

## 2018-03-25 ENCOUNTER — Telehealth: Payer: Self-pay | Admitting: Internal Medicine

## 2018-03-25 MED ORDER — PANTOPRAZOLE SODIUM 20 MG PO TBEC: 20.0000 mg | DELAYED_RELEASE_TABLET | Freq: Every day | ORAL | 3 refills | Status: AC

## 2018-03-25 NOTE — Telephone Encounter (Signed)
Per CY lets change to Protonix 20 mg #90 take 1 po QD with 3 refills to [facility]. Pt is aware of change as well.

## 2018-03-31 ENCOUNTER — Telehealth: Payer: Self-pay | Admitting: Internal Medicine

## 2018-03-31 NOTE — Telephone Encounter (Signed)
Rec'd completed disability forms. Fwd to [employer] via interoffice mail -pr  °

## 2018-04-01 ENCOUNTER — Encounter: Payer: Self-pay | Admitting: Internal Medicine

## 2018-04-01 ENCOUNTER — Ambulatory Visit: Payer: 59 | Admitting: Internal Medicine

## 2018-04-01 VITALS — BP 122/62 | HR 76 | Ht 68.0 in | Wt 169.0 lb

## 2018-04-01 DIAGNOSIS — E279 Disorder of adrenal gland, unspecified: Secondary | ICD-10-CM | POA: Diagnosis not present

## 2018-04-01 DIAGNOSIS — J449 Chronic obstructive pulmonary disease, unspecified: Secondary | ICD-10-CM

## 2018-04-01 NOTE — Progress Notes (Signed)
Patient ID: Eric [name], male    DOB: May 17, 1955, 63 y.o.   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   CT chest 04/01/2016 showed emphysema and scarring with chronic bronchitis changes but no ILD and no bronchiectasis ---------------------------------------------------------------------------  09/29/2017-  63 year old male never smoker followed for COPD mixed type, ended Xolair 08/28/2011, history RML resection age 89, chronic sinus disease, restless legs, complicated by GERD -----COPD, productive cough w/ clear mucus, wheezing Symbicort 160, prednisone 10 mg daily, Singulair, Atrovent/ albuterol neb solution, Ventolin,  Prednisone alt 5 w 10 mg qod For the last 2 or 3 months, usually dry or with sense that he has some mucus hung up at level of carina.  Occasionally uses nebulizer machine if needed.  Feels he is doing well "for me" now with no recent acute exacerbations.  We discussed his long history of recurrent sinus infections especially in high school.  04/01/2018- 63 year old male never smoker followed for COPD mixed type, ended Xolair 08/28/2011, On Fasenra.  history RML resection age 65, chronic sinus disease, restless legs, complicated by GERD -----Asthma: Pt feels the [name] is working well and denies any major breathing issues at this time.  On hydrocortisone from [name], instead of prednisone. Able to be off Singulair.  Lab-09/29/2017- EOS 9.3 H,  IgE 190 H Wife had health problems this winter- impacted his sleep routine. [name] has allowed reduced maintenance steroid. With presumed adrenal insufficiency from long term steroid use, Endocrinology/ [name] helping with  this.  Continues Symbicort 160/  Review of Systems-See HPI   + = positive Constitutional:   No weight loss, night sweats,  Fevers, chills, fatigue, lassitude. HEENT:   No headaches,  Difficulty swallowing,  Tooth/dental problems,  Sore throat,                No sneezing, itching, ear ache, +-nasal congestion, post nasal drip,  CV:   chest pain, orthopnea, PND, swelling in lower extremities, anasarca, dizziness, palpitations GI    Heartburn, indigestion, no-abdominal pain, nausea, vomiting, Resp:    +-productive cough,  + non-productive cough,  No coughing up of blood.  No change in color of mucus.                                       wheezing. +Short of breath mainly w/ exertion.  Skin: no rash or lesions. GU:  MS:  No joint pain or swelling. Marland Kitchen Psych:  No change in mood or affect. No depression or anxiety.  No memory loss.  Objective:   Physical Exam General- Alert, Oriented, Affect-appropriate, Distress- none acute,  Looks well-Not cushingoid  Skin- rash-none, lesions- none, excoriation- none,  Lymphadenopathy- none Head- atraumatic            Eyes- Gross vision intact, PERRLA, conjunctivae- clear secretions            Ears- Hearing, canals normal            Nose- +  stuffy, no-Septal dev,  polyps, erosion, perforation             Throat- Mallampati II , mucosa clear , drainage- none, tonsils-  atrophic.  Neck- flexible , trachea midline, no stridor , thyroid nl, carotid no bruit Chest - symmetrical excursion , unlabored           Heart/CV- RRR , no murmur , no gallop  , no rub, nl s1 s2                           - JVD- none , edema- none, stasis changes- none, varices- none           Lung-    Wheeze - none , able to speak full sentences. Cough - none,                                          dullness-none, rub- none.            Chest wall-  Abd- Br/ Gen/ Rectal- Not done, not indicated Extrem- cyanosis- none, clubbing, none, atrophy- none, strength- nl Neuro- grossly intact to  observation

## 2018-04-01 NOTE — Patient Instructions (Signed)
We can continue present meds  Please call if we can help 

## 2018-04-27 ENCOUNTER — Telehealth: Payer: Self-pay | Admitting: Internal Medicine

## 2018-04-28 NOTE — Telephone Encounter (Signed)
tammy please advise thanks

## 2018-04-28 NOTE — Telephone Encounter (Signed)
Will await response from TS.

## 2018-04-28 NOTE — Telephone Encounter (Signed)
Pt would like an update on Fasenra shipment. Cb is [phone]

## 2018-04-30 ENCOUNTER — Telehealth: Payer: Self-pay | Admitting: Internal Medicine

## 2018-04-30 NOTE — Telephone Encounter (Signed)
Called [facility] this morning, to order pt's fasenra. Rep went ahead and set up del for Mon., then transferred me to the P/A Dept to do a verbal P/A.

## 2018-04-30 NOTE — Telephone Encounter (Signed)
I did a verbal P/A this morning. I originally called to order pt's [name], the rep went ahead and set up del. For 05/03/2018. Pt was approved, [name] ordered, del. 05/03/2018. I'll put order in [place] smart text. Nothing further needed.

## 2018-04-30 NOTE — Telephone Encounter (Signed)
I did the verbal P/A this morning. I called back to check the status this afternoon, approved. I set up del. With [facility] rep. 05/03/2018. I called pt. Made him an appt. For 9:45 Tues..  1 prefilled syringe Ordered date: 04/30/2018 Shipping date: 05/02/2018 [facility] on Mondays. Waiting for [name] to come in.

## 2018-05-03 NOTE — Telephone Encounter (Signed)
Arrival Date: 05/03/2018 Lot #: [id number] Exp date: 11/2019

## 2018-05-04 ENCOUNTER — Ambulatory Visit (INDEPENDENT_AMBULATORY_CARE_PROVIDER_SITE_OTHER): Payer: Managed Care, Other (non HMO)

## 2018-05-04 DIAGNOSIS — J4550 Severe persistent asthma, uncomplicated: Secondary | ICD-10-CM

## 2018-05-04 MED ORDER — BENRALIZUMAB 30 MG/ML ~~LOC~~ SOSY
30.0000 mg | PREFILLED_SYRINGE | Freq: Once | SUBCUTANEOUS | Status: AC
  Administered 2018-05-04: 30 mg via SUBCUTANEOUS

## 2018-05-13 ENCOUNTER — Telehealth: Payer: Self-pay | Admitting: Internal Medicine

## 2018-05-13 NOTE — Telephone Encounter (Signed)
Per our records, pt's [name] PA is expired as of 04/22/2018. PA forms have been completed and faxed to [facility]. Will await decision.  For follow up, [facility] can be contacted at [phone].

## 2018-05-26 NOTE — Telephone Encounter (Signed)
Called [facility] and checked status to the pt's PA. PA has been approved 04/30/2018 - 05/01/2019. Nothing further is needed at this time.

## 2018-05-27 ENCOUNTER — Other Ambulatory Visit: Payer: Self-pay | Admitting: Internal Medicine

## 2018-05-27 DIAGNOSIS — J449 Chronic obstructive pulmonary disease, unspecified: Secondary | ICD-10-CM

## 2018-05-27 MED ORDER — ALPRAZOLAM 0.5 MG PO TABS: 0.5000 mg | ORAL_TABLET | Freq: Two times a day (BID) | ORAL | 1 refills | Status: DC | PRN

## 2018-06-07 NOTE — Assessment & Plan Note (Signed)
Asthma/ COPD overlap in never smoker with normal a1AT. Eric [name] has helped. Plan continue Rx based on Symbicort and Eric [name]

## 2018-06-07 NOTE — Assessment & Plan Note (Signed)
Treatment induced adrenal insufficiency. Very slow steroid weaning being managed by Endocrinology.

## 2018-06-22 ENCOUNTER — Telehealth: Payer: Self-pay | Admitting: Internal Medicine

## 2018-06-22 NOTE — Telephone Encounter (Signed)
Fasenra shipment received for patient's next injection 30mg  #1 prefilled syringe Medication arrival Date:06/22/2018 Lot #: [id number] Medication exp date: 09/2019

## 2018-06-24 ENCOUNTER — Encounter: Payer: Self-pay | Admitting: Internal Medicine

## 2018-06-25 ENCOUNTER — Other Ambulatory Visit: Payer: Self-pay | Admitting: *Deleted

## 2018-06-25 MED ORDER — ALBUTEROL SULFATE (2.5 MG/3ML) 0.083% IN NEBU: INHALATION_SOLUTION | RESPIRATORY_TRACT | 1 refills | Status: DC

## 2018-07-02 ENCOUNTER — Ambulatory Visit (INDEPENDENT_AMBULATORY_CARE_PROVIDER_SITE_OTHER): Payer: Managed Care, Other (non HMO)

## 2018-07-02 ENCOUNTER — Other Ambulatory Visit: Payer: Self-pay

## 2018-07-02 DIAGNOSIS — J4550 Severe persistent asthma, uncomplicated: Secondary | ICD-10-CM

## 2018-07-02 MED ORDER — BENRALIZUMAB 30 MG/ML ~~LOC~~ SOSY
30.0000 mg | PREFILLED_SYRINGE | Freq: Once | SUBCUTANEOUS | Status: AC
  Administered 2018-07-02: 14:00:00 30 mg via SUBCUTANEOUS

## 2018-07-02 NOTE — Progress Notes (Signed)
Have you been hospitalized within the last 10 days?  No Do you have a fever?  No Do you have a cough?  Yes A little productive (pt always has a cough) Do you have a headache or sore throat? No

## 2018-07-23 ENCOUNTER — Emergency Department (HOSPITAL_COMMUNITY): Payer: Managed Care, Other (non HMO)

## 2018-07-23 ENCOUNTER — Emergency Department (HOSPITAL_COMMUNITY)
Admission: EM | Admit: 2018-07-23 | Discharge: 2018-07-23 | Disposition: A | Discharge status: Home / Self Care | Payer: Managed Care, Other (non HMO) | Attending: Emergency Medicine | Admitting: Emergency Medicine

## 2018-07-23 ENCOUNTER — Encounter (HOSPITAL_COMMUNITY): Payer: Self-pay | Admitting: Emergency Medicine

## 2018-07-23 DIAGNOSIS — J449 Chronic obstructive pulmonary disease, unspecified: Secondary | ICD-10-CM | POA: Diagnosis not present

## 2018-07-23 DIAGNOSIS — N23 Unspecified renal colic: Secondary | ICD-10-CM | POA: Diagnosis not present

## 2018-07-23 DIAGNOSIS — Z79899 Other long term (current) drug therapy: Secondary | ICD-10-CM | POA: Diagnosis not present

## 2018-07-23 DIAGNOSIS — R1031 Right lower quadrant pain: Secondary | ICD-10-CM | POA: Diagnosis present

## 2018-07-23 LAB — URINALYSIS, ROUTINE W REFLEX MICROSCOPIC
Bacteria, UA: NONE SEEN
Bilirubin Urine: NEGATIVE
Glucose, UA: NEGATIVE mg/dL
Ketones, ur: 20 mg/dL — AB
Leukocytes,Ua: NEGATIVE
Nitrite: NEGATIVE
Protein, ur: NEGATIVE mg/dL
RBC / HPF: >50 RBC/hpf — ABNORMAL HIGH (ref 0–5)
Specific Gravity, Urine: 1.031 — ABNORMAL HIGH (ref 1.005–1.030)
pH: 5 (ref 5.0–8.0)

## 2018-07-23 LAB — COMPREHENSIVE METABOLIC PANEL
ALT: 50 U/L — ABNORMAL HIGH (ref 0–44)
AST: 37 U/L (ref 15–41)
Albumin: 4.1 g/dL (ref 3.5–5.0)
Alkaline Phosphatase: 72 U/L (ref 38–126)
Anion gap: 6 (ref 5–15)
BUN: 20 mg/dL (ref 6–20)
CO2: 27 mmol/L (ref 22–32)
Calcium: 9 mg/dL (ref 8.9–10.3)
Chloride: 108 mmol/L (ref 98–111)
Creatinine, Ser: 0.85 mg/dL (ref 0.61–1.24)
GFR calc Af Amer: >60 mL/min (ref >60)
GFR calc non Af Amer: >60 mL/min (ref >60)
Glucose, Bld: 122 mg/dL — ABNORMAL HIGH (ref 70–99)
Potassium: 3.7 mmol/L (ref 3.5–5.1)
Sodium: 141 mmol/L (ref 135–145)
Total Bilirubin: 1.1 mg/dL (ref 0.3–1.2)
Total Protein: 7 g/dL (ref 6.5–8.1)

## 2018-07-23 LAB — CBC
HCT: 45.3 % (ref 39.0–52.0)
Hemoglobin: 15.1 g/dL (ref 13.0–17.0)
MCH: 31.4 pg (ref 26.0–34.0)
MCHC: 33.3 g/dL (ref 30.0–36.0)
MCV: 94.2 fL (ref 80.0–100.0)
Platelets: 234 10*3/uL (ref 150–400)
RBC: 4.81 MIL/uL (ref 4.22–5.81)
RDW: 12.3 % (ref 11.5–15.5)
WBC: 13.1 10*3/uL — ABNORMAL HIGH (ref 4.0–10.5)
nRBC: 0 % (ref 0.0–0.2)

## 2018-07-23 LAB — LIPASE, BLOOD: Lipase: 34 U/L (ref 11–51)

## 2018-07-23 MED ORDER — OXYCODONE-ACETAMINOPHEN 5-325 MG PO TABS: 1.0000 | ORAL_TABLET | Freq: Four times a day (QID) | ORAL | 0 refills | Status: DC | PRN

## 2018-07-23 MED ORDER — SODIUM CHLORIDE 0.9 % IV BOLUS
1000.0000 mL | Freq: Once | INTRAVENOUS | Status: AC
  Administered 2018-07-23: 1000 mL via INTRAVENOUS

## 2018-07-23 MED ORDER — HYDROMORPHONE HCL 1 MG/ML IJ SOLN
1.0000 mg | Freq: Once | INTRAMUSCULAR | Status: AC
  Administered 2018-07-23: 1 mg via INTRAVENOUS
  Filled 2018-07-23: qty 1

## 2018-07-23 MED ORDER — MORPHINE SULFATE (PF) 4 MG/ML IV SOLN
4.0000 mg | Freq: Once | INTRAVENOUS | Status: AC
  Administered 2018-07-23: 4 mg via INTRAVENOUS
  Filled 2018-07-23: qty 1

## 2018-07-23 MED ORDER — TAMSULOSIN HCL 0.4 MG PO CAPS: 0.4000 mg | ORAL_CAPSULE | Freq: Every day | ORAL | 0 refills | Status: DC

## 2018-07-23 MED ORDER — ONDANSETRON HCL 4 MG/2ML IJ SOLN
4.0000 mg | Freq: Once | INTRAMUSCULAR | Status: AC
  Administered 2018-07-23: 4 mg via INTRAVENOUS
  Filled 2018-07-23: qty 2

## 2018-07-23 MED ORDER — IBUPROFEN 800 MG PO TABS: 800.0000 mg | ORAL_TABLET | Freq: Three times a day (TID) | ORAL | 0 refills | Status: AC

## 2018-07-23 MED ORDER — KETOROLAC TROMETHAMINE 30 MG/ML IJ SOLN
30.0000 mg | Freq: Once | INTRAMUSCULAR | Status: AC
  Administered 2018-07-23: 30 mg via INTRAVENOUS
  Filled 2018-07-23: qty 1

## 2018-07-23 MED ORDER — SODIUM CHLORIDE 0.9% FLUSH
3.0000 mL | Freq: Once | INTRAVENOUS | Status: AC
  Administered 2018-07-23: 3 mL via INTRAVENOUS

## 2018-07-23 NOTE — Discharge Instructions (Addendum)
Take motrin every 6 hrs for the next 2 days then as needed.   Take percocet for pain   Take flomax daily   You have multiple kidney stones and swollen right kidney. You need to call urologist on Monday for close follow up   Return to ER if you have fever, uncontrolled pain, vomiting, trouble urinating

## 2018-07-23 NOTE — ED Notes (Signed)
ED Provider at bedside. 

## 2018-07-23 NOTE — ED Triage Notes (Signed)
Per pt, states RLQ pain radiating to back-nausea-history of kidney stones but pain is different-no dysuria

## 2018-07-23 NOTE — ED Provider Notes (Signed)
[facility] DEPT Provider Note   CSN: [id number] Arrival date & time: 07/23/18  1535    History   Chief Complaint Chief Complaint  Patient presents with   Abdominal Pain    HPI MERWYN HODAPP is a 63 y.o. male hx of kidney stone here presenting with right lower quadrant pain.  Patient states that he has sharp right lower quadrant pain with radiation the right flank area this afternoon.  It was acute in onset and he states that he had decreased urination.  Denies any blood in his urine or fever or vomiting.  States that this is similar to his previous kidney stones but just more severe.  Had previous ureteral stent in the past. Denies any fevers or chills or cough or shortness of breath.  Has any confirmed contacts with COVID.      The history is provided by the patient.    Past Medical History:  Diagnosis Date   Allergic rhinitis, cause unspecified    Chronic airway obstruction, not elsewhere classified    GERD (gastroesophageal reflux disease)    History of kidney stones    Lung nodule    13x64mm posterior RLL CT 06-27-09   Unspecified asthma(493.90)    start Xolair 03-2010   Unspecified sinusitis (chronic)     Patient Active Problem List   Diagnosis Date Noted   Anxiety and depression 09/21/2015   Restless legs 09/21/2015   Chest pain, atypical 02/02/2015   Seasonal and perennial allergic rhinitis 09/04/2013   Adrenal disease ([place]) 07/19/2011   Hyperglycemia 07/11/2011   Hypocalcemia 07/11/2011   Urolithiasis 07/11/2011   Submandibular gland infection 05/30/2010   NASAL POLYP 05/20/2010   LUNG NODULE 06/28/2009   BRONCHITIS, ACUTE 06/18/2009   Sinusitis, chronic 10/12/2007   COPD mixed type ([place]) 03/18/2007    Past Surgical History:  Procedure Laterality Date   LOBECTOMY  age 58   Right middle with tracheostomy   many asthma hospitalizations     NASAL SINUS SURGERY  1990   x 2   Submandibular  salivary glands removed  2012   SYMPATHECTOMY     Right carotid body removed for asthma   ventilator dependent respiratory failure  1989   asthma         Home Medications    Prior to Admission medications   Medication Sig Start Date End Date Taking? Authorizing Provider  albuterol (PROVENTIL) (2.5 MG/3ML) 0.083% nebulizer solution USE 1 VIAL VIA NEBULIZER 3  TO 4 TIMES DAILY 06/25/18   [name] D, MD  albuterol (VENTOLIN HFA) 108 (90 Base) MCG/ACT inhaler 2 puffs four times daily as needed 11/12/17 05/26/19  Deneise Lever, MD  ALPRAZolam Duanne Moron) 0.5 MG tablet Take 1 tablet (0.5 mg total) by mouth 2 (two) times daily as needed. 05/27/18   Deneise Lever, MD  Calcium Carbonate-Vitamin D (CALCIUM 600+D) 600-200 MG-UNIT TABS Take 1 tablet by mouth 2 (two) times daily.      [provider]  citalopram (CELEXA) 20 MG tablet Take 20 mg by mouth daily.    [provider]  FASENRA 30 MG/ML SOSY  03/04/18   [provider]  fluticasone Asencion Islam) 50 MCG/ACT nasal spray Use 1 to 2 sprays in each  nostril daily at bedtime as needed 09/22/16   [name] D, MD  hydrocortisone (CORTEF) 10 MG tablet Take 20 mg by mouth daily.    [provider]  ipratropium (ATROVENT) 0.02 % nebulizer solution USE 1 VIAL  VIA NEBULIZER  EVERY 6 HOURS Patient taking differently: USE 1 VIAL VIA NEBULIZER  EVERY 6 HOURS PRN 08/08/16   [name] D, MD  Magnesium Cl-Calcium Carbonate (SLOW-MAG PO) Take 2 tablets by mouth daily.    [provider]  Nebulizers (COMPRESSOR NEBULIZER) MISC 1 Device by Does not apply route once. 09/20/15   [name] D, MD  pantoprazole (PROTONIX) 20 MG tablet Take 1 tablet (20 mg total) by mouth daily. 03/25/18   Deneise Lever, MD  predniSONE (DELTASONE) 1 MG tablet Take 1 tablet (1 mg total) by mouth daily with breakfast. Patient not taking: Reported on 04/01/2018 01/04/18   Deneise Lever, MD  predniSONE (DELTASONE) 5 MG tablet 1  or 2 daily Patient not taking: Reported on 04/01/2018 03/30/17   Deneise Lever, MD  Respiratory Therapy Supplies (FLUTTER) DEVI Blow through 3 times per set, three times daily 03/24/16   [name] D, MD  risedronate (ACTONEL) 150 MG tablet Take 1 tablet by mouth every 30 (thirty) days.  08/06/16   [provider]  SYMBICORT 160-4.5 MCG/ACT inhaler USE 2 PUFFS TWO TIMES DAILY RINSE MOUTH AFTER 11/30/17   Deneise Lever, MD    Family History Family History  Problem Relation Age of Onset   Cancer Mother        lymphoepithelial   Asthma Father        allergic   Asthma Brother        allergic   Testicular cancer Maternal Grandfather    Cancer Other        Grandfather-Prostate Cancer    Social History Social History   Tobacco Use   Smoking status: Never Smoker   Smokeless tobacco: Never Used  Substance Use Topics   Alcohol use: Yes    Comment: rarely   Drug use: No     Allergies   Erythromycin ethylsuccinate and Penicillins   Review of Systems Review of Systems  Gastrointestinal: Positive for abdominal pain.  All other systems reviewed and are negative.    Physical Exam Updated Vital Signs BP 123/84    Pulse 80    Temp 98.5 F (36.9 C) (Oral)    Resp 18    SpO2 100%   Physical Exam Vitals signs and nursing note reviewed.  Constitutional:      Comments: Uncomfortable   HENT:     Head: Normocephalic.  Eyes:     Extraocular Movements: Extraocular movements intact.  Cardiovascular:     Rate and Rhythm: Normal rate and regular rhythm.     Heart sounds: Normal heart sounds.  Pulmonary:     Effort: Pulmonary effort is normal.     Breath sounds: Normal breath sounds.  Abdominal:     General: Abdomen is flat. Bowel sounds are normal.     Palpations: Abdomen is soft.     Comments: RLQ tenderness, no rebound. Mild R CVAT   Skin:    General: Skin is warm.     Capillary Refill: Capillary refill takes less than 2 seconds.  Neurological:      General: No focal deficit present.     Mental Status: He is alert.  Psychiatric:        Mood and Affect: Mood normal.        Behavior: Behavior normal.      ED Treatments / Results  Labs (all labs ordered are listed, but only abnormal results are displayed) Labs Reviewed  COMPREHENSIVE METABOLIC PANEL - Abnormal; Notable for the  following components:      Result Value   Glucose, Bld 122 (*)    ALT 50 (*)    All other components within normal limits  CBC - Abnormal; Notable for the following components:   WBC 13.1 (*)    All other components within normal limits  URINALYSIS, ROUTINE W REFLEX MICROSCOPIC - Abnormal; Notable for the following components:   Color, Urine AMBER (*)    APPearance HAZY (*)    Specific Gravity, Urine 1.031 (*)    Hgb urine dipstick LARGE (*)    Ketones, ur 20 (*)    RBC / HPF >50 (*)    All other components within normal limits  URINE CULTURE  LIPASE, BLOOD    EKG None  Radiology Ct Renal Stone Study  Result Date: 07/23/2018 CLINICAL DATA:  Right-sided flank pain EXAM: CT ABDOMEN AND PELVIS WITHOUT CONTRAST TECHNIQUE: Multidetector CT imaging of the abdomen and pelvis was performed following the standard protocol without oral or IV contrast. COMPARISON:  December 30, 2011 FINDINGS: Lower chest: There is atelectatic change in the lung bases. There is no lung base edema or consolidation. There Is incomplete visualization of a focal area of decreased attenuation having attenuation values consistent with cystic fluid adjacent to the left heart border measuring 4.3 x 3.9 cm. Hepatobiliary: No focal liver lesions are demonstrable on this noncontrast enhanced study. Gallbladder wall is not appreciably thickened. There is no biliary duct dilatation. Pancreas: There is no pancreatic mass or inflammatory focus. Spleen: No splenic lesions are evident. Adrenals/Urinary Tract: Adrenals bilaterally appear normal. There is no appreciable renal mass on either side.  There is marked right renal edema with extensive perinephric stranding and perinephric fluid on the right. There is severe hydronephrosis on the right. There is no appreciable hydronephrosis on the left. There is a calculus in the lower pole of the left kidney measuring 9 x 7 mm. There is a 4 x 1 mm calculus nearby in the lower pole left kidney. There is a 1 mm calculus more superiorly in the upper left kidney. There is no intrarenal calculus on the right. There is a 3 mm calculus in the right ureter at the L5 level with proximal ureteral dilatation on the right with periureteral soft tissue stranding. No other ureteral calculi are evident on either side. The urinary bladder is midline with wall thickness within normal limits. Note that a portion of the anterior inferior urinary bladder extends into a right inguinal hernia without compromise of the bladder wall in this area. Stomach/Bowel: There is extensive sigmoid and to a lesser extent descending colonic diverticulosis. No evident diverticulitis. There is no appreciable bowel wall or mesenteric thickening. There is no evident bowel obstruction. Terminal ileum appears unremarkable. There is no free air or portal venous air. Vascular/Lymphatic: There is no abdominal aortic aneurysm. No vascular lesions are demonstrable on this noncontrast enhanced study. There is no evident adenopathy in the abdomen or pelvis. Reproductive: There are a few tiny prostatic calculi. Prostate and seminal vesicles appear normal in size and contour. No pelvic masses evident. Other: The appendix appears normal. There is no evident abscess or ascites in the abdomen or pelvis. There is a small ventral hernia containing only fat. There are fat containing inguinal hernias bilaterally with a small segment of urinary bladder extending into the proximal right inguinal hernia without urinary bladder compromise. Musculoskeletal: There is degenerative change in lumbar spine, most notably at L5-S1.  There is a benign-appearing lucent area with sclerotic  periphery in the superior aspect of the right iliac crest measuring 1.3 x 1.0 cm, stable. No new bone lesions are evident. No intramuscular lesions are evident. IMPRESSION: 1. There is marked hydronephrosis and proximal ureterectasis on the right. There is extensive right renal edema with perinephric stranding and fluid on the right, extensive. There is a 3 mm calculus in the right ureter at the L5 level causing obstruction. 2. Nonobstructing calculi in the left kidney, largest measuring 9 x 7 mm. No hydronephrosis or ureteral calculus on the left. 3. A small portion of the anterior urinary bladder extends into a right inguinal hernial without wall thickening or compromise of the bladder. 4. Extensive left-sided colonic diverticulosis without diverticulitis. No bowel obstruction. No abscess in the abdomen or pelvis. Appendix appears normal. 5. Fluid density lesion adjacent to the right heart border, incompletely visualized. Question pericardial cyst. This finding warrants additional imaging surveillance. Nonemergent complete chest CT or echocardiography to further evaluate this lesion is felt to be warranted. 6. Stable small benign-appearing lesion in the superior aspect of the right iliac crest. 7. Small ventral hernia containing only fat. Fat containing inguinal hernias bilaterally. As noted above, a portion of the anterior aspect of the urinary bladder extends into the right inguinal hernia without urinary bladder compromise. Electronically Signed   By: Lowella Grip III M.D.   On: 07/23/2018 17:58    Procedures Procedures (including critical care time)  Medications Ordered in ED Medications  sodium chloride flush (NS) 0.9 % injection 3 mL (3 mLs Intravenous Given 07/23/18 1619)  sodium chloride 0.9 % bolus 1,000 mL (0 mLs Intravenous Stopped 07/23/18 1653)  morphine 4 MG/ML injection 4 mg (4 mg Intravenous Given 07/23/18 1619)  ondansetron  (ZOFRAN) injection 4 mg (4 mg Intravenous Given 07/23/18 1619)  HYDROmorphone (DILAUDID) injection 1 mg (1 mg Intravenous Given 07/23/18 1652)  ketorolac (TORADOL) 30 MG/ML injection 30 mg (30 mg Intravenous Given 07/23/18 1736)     Initial Impression / Assessment and Plan / ED Course  I have reviewed the triage vital signs and the nursing notes.  Pertinent labs & imaging results that were available during my care of the patient were reviewed by me and considered in my medical decision making (see chart for details).       KRISTINE TILEY is a 63 y.o. male here with RLQ pain. Likely renal colic, less likely appendicitis. Will get labs, UA, CT renal stone. Will give pain meds and reassess.    7:14 PM Patient has hydro on the R with 3 mm ureteral stone and intra renal stones. UA showed no UTI. Urine culture added. Pain improved with toradol, pain meds. He had ureteral stents in the past. Since he is afebrile, doesn't appear septic and pain is controlled, will dc home with motrin, percocet, flomax. Will have him call [facility] for close follow up   Final Clinical Impressions(s) / ED Diagnoses   Final diagnoses:  None    ED Discharge Orders    None       Drenda Freeze, MD 07/23/18 [id number]

## 2018-07-25 LAB — URINE CULTURE: Culture: NO GROWTH

## 2018-08-09 ENCOUNTER — Telehealth: Payer: Self-pay | Admitting: Internal Medicine

## 2018-08-09 NOTE — Telephone Encounter (Signed)
[name] Order: 30mg  #1 prefilled syringe Ordered date: 08/09/2018 Expected date of arrival: 08/11/2018 Ordered by: Desmond Dike, [place] [facility]: [facility]

## 2018-08-11 NOTE — Telephone Encounter (Signed)
Fasenra Shipment Received:  30mg  #1 prefilled syringe Medication arrival date: 08/11/2018 Lot #: [id number] Exp date: 09/2019 Received by: TBS

## 2018-08-19 ENCOUNTER — Ambulatory Visit: Payer: Managed Care, Other (non HMO)

## 2018-08-20 ENCOUNTER — Ambulatory Visit: Payer: Managed Care, Other (non HMO)

## 2018-08-23 ENCOUNTER — Other Ambulatory Visit: Payer: Self-pay

## 2018-08-23 ENCOUNTER — Ambulatory Visit (INDEPENDENT_AMBULATORY_CARE_PROVIDER_SITE_OTHER): Payer: Managed Care, Other (non HMO)

## 2018-08-23 DIAGNOSIS — J4550 Severe persistent asthma, uncomplicated: Secondary | ICD-10-CM | POA: Diagnosis not present

## 2018-08-23 MED ORDER — BENRALIZUMAB 30 MG/ML ~~LOC~~ SOSY
30.0000 mg | PREFILLED_SYRINGE | Freq: Once | SUBCUTANEOUS | Status: AC
  Administered 2018-08-23: 10:00:00 30 mg via SUBCUTANEOUS

## 2018-08-23 NOTE — Progress Notes (Signed)
All questions were answered by the patient before medication was administered. Have you been hospitalized in the last 10 days? Yes - [facility] Do you have a fever? No Do you have a cough? No Do you have a headache or sore throat? No

## 2018-09-28 ENCOUNTER — Telehealth: Payer: Self-pay | Admitting: Internal Medicine

## 2018-09-28 NOTE — Telephone Encounter (Signed)
Will route to Injection Pool for follow up.

## 2018-09-29 NOTE — Telephone Encounter (Signed)
Spoke with pharmacy regarding delivery date:  Expected delivery is 09/30/18.

## 2018-09-30 ENCOUNTER — Telehealth: Payer: Self-pay | Admitting: Internal Medicine

## 2018-09-30 ENCOUNTER — Encounter: Payer: Self-pay | Admitting: Internal Medicine

## 2018-09-30 ENCOUNTER — Ambulatory Visit (INDEPENDENT_AMBULATORY_CARE_PROVIDER_SITE_OTHER): Payer: Managed Care, Other (non HMO) | Admitting: Internal Medicine

## 2018-09-30 ENCOUNTER — Other Ambulatory Visit: Payer: Self-pay

## 2018-09-30 DIAGNOSIS — E279 Disorder of adrenal gland, unspecified: Secondary | ICD-10-CM | POA: Diagnosis not present

## 2018-09-30 DIAGNOSIS — J449 Chronic obstructive pulmonary disease, unspecified: Secondary | ICD-10-CM

## 2018-09-30 NOTE — Patient Instructions (Addendum)
Order- change Fasenra to self- injector for home administration, continue current dose  We can continue other meds as before   Please call as needed

## 2018-09-30 NOTE — Telephone Encounter (Signed)
Patient had appointment via televisit today 09/30/2018 with CY and requested to try [facility] self-injector for home administration. CY agreed with patient's request and would like to begin that process of establishing patient with [facility] self-injector home adminstration with patient's current dose of 30 mg every 8 weeks.   CY requested I route this message to [place] directly and the [facility] injection pool for follow-up. He states Ria Comment can follow-up on this process when she returns to the office next week.

## 2018-09-30 NOTE — Progress Notes (Signed)
Patient ID: Eric [name], male    DOB: 1956-02-11, 63 y.o.   MRN: [id number]  HPI male never smoker followed for chronic obstructive asthma, ended Xolair 08/28/2011, hx RML resection age 67, chronic sinus disease, restless legs, complicated by GERD  CF Sweat Chloride Test  Negative (remote) Office spirometry 04/08/12- severe obstructive airways disease- FVC 2.64/58%, FEV1 1.30/36%, FEV1/FVC 0.49, FEF 25-75% 0.50/14%.   CT chest 04/01/2016 showed emphysema and scarring with chronic bronchitis changes but no ILD and no bronchiectasis ---------------------------------------------------------------------------  04/01/2018- 63 year old male never smoker followed for COPD mixed type, ended Xolair 08/28/2011, On Fasenra.  history RML resection age 36, chronic sinus disease, restless legs, complicated by GERD -----Asthma: Pt feels the [name] is working well and denies any major breathing issues at this time.  On hydrocortisone from [name], instead of prednisone. Able to be off Singulair.  Lab-09/29/2017- EOS 9.3 H,  IgE 190 H Wife had health problems this winter- impacted his sleep routine. [name] has allowed reduced maintenance steroid. With presumed adrenal insufficiency from long term steroid use, Endocrinology/ [name] helping with this.  Continues Symbicort 160/  09/30/2018-Virtual Visit via Telephone Note  I connected with [name] on 09/30/18 at 10:00 AM EDT by telephone and verified that I am speaking with the correct person using two identifiers.  Location: Patient: H Provider: O   I discussed the limitations, risks, security and privacy concerns of performing an evaluation and management service by telephone and the availability of in person appointments. I also discussed with the patient that there may be a patient responsible charge related to this service. The patient expressed understanding and agreed to proceed.   History of Present Illness: 63 year old male never smoker  followed for COPD mixed type, ended Xolair 08/28/2011, On Fasenra.  history RML resection age 67, chronic sinus disease, restless legs, complicated by GERD Continues Fasenra inj- helps. Symbicort 160, Hydrocortisone maint . -----pt states breathing is at baseline, taking Fasenra q8w & inhalers as directed; reports hydrocortisone 1/2 x 10 mg tab twice daily-- He continues to work with [name]/ Endocrinology, managing his hydrocortisone. Infrequent need for rescue albuterol and no need for nebulizer in long time. Accepts some mild breakthrough wheeze as normal for him all his life, without significant flare. Credits Fasenra for making big difference.  He would like to change to self-injecting Fasenra at home. Discussed Covid signs and precautions- he is careful.   Observations/Objective: Faint wheeze audible during phone conversation but he sounded comfortable.  Assessment and Plan: COPD mixed type/ Asthma overlap. Reports Fasenra big help Chronic sinusitis- controlled currently Adrenal insufficiency- followed by [name] Follow Up Instructions:  1 year  I discussed the assessment and treatment plan with the patient. The patient was provided an opportunity to ask questions and all were answered. The patient agreed with the plan and demonstrated an understanding of the instructions.   The patient was advised to call back or seek an in-person evaluation if the symptoms worsen or if the condition fails to improve as anticipated.  I provided 18 minutes of non-face-to-face time during this encounter.   [name], MD    63 year old male never smoker followed for COPD mixed type, ended Xolair 08/28/2011, On Fasenra.  history RML resection age 78, chronic sinus disease, restless legs, complicated by GERD   Review of Systems-See HPI   + = positive Constitutional:   No weight loss, night sweats,  Fevers, chills, fatigue, lassitude. HEENT:   No headaches,  Difficulty  swallowing,  Tooth/dental  problems,  Sore throat,                No sneezing, itching, ear ache, +-nasal congestion, post nasal drip,  CV:   chest pain, orthopnea, PND, swelling in lower extremities, anasarca, dizziness, palpitations GI    Heartburn, indigestion, no-abdominal pain, nausea, vomiting, Resp:    +-productive cough,  + non-productive cough,  No coughing up of blood.  No change in color of mucus.                                       wheezing. +Short of breath mainly w/ exertion.  Skin: no rash or lesions. GU:  MS:  No joint pain or swelling. Marland Kitchen Psych:  No change in mood or affect. No depression or anxiety.  No memory loss.  Objective:   Physical Exam General- Alert, Oriented, Affect-appropriate, Distress- none acute,  Looks well-Not cushingoid  Skin- rash-none, lesions- none, excoriation- none,  Lymphadenopathy- none Head- atraumatic            Eyes- Gross vision intact, PERRLA, conjunctivae- clear secretions            Ears- Hearing, canals normal            Nose- +  stuffy, no-Septal dev,  polyps, erosion, perforation             Throat- Mallampati II , mucosa clear , drainage- none, tonsils- atrophic.  Neck- flexible , trachea midline, no stridor , thyroid nl, carotid no bruit Chest - symmetrical excursion , unlabored           Heart/CV- RRR , no murmur , no gallop  , no rub, nl s1 s2                           - JVD- none , edema- none, stasis changes- none, varices- none           Lung-    Wheeze - none , able to speak full sentences. Cough - none,                                          dullness-none, rub- none.            Chest wall-  Abd- Br/ Gen/ Rectal- Not done, not indicated Extrem- cyanosis- none, clubbing, none, atrophy- none, strength- nl Neuro- grossly intact to observation

## 2018-09-30 NOTE — Telephone Encounter (Signed)
Fasenra Shipment Received:  30mg  #1 prefilled syringe Medication arrival date: 09/30/18 Lot #: [id number] Exp date: 12/2019 Received by: Suzi Roots

## 2018-10-01 NOTE — Assessment & Plan Note (Signed)
Chronic obstructive asthma with significant fixed component. Much less exacerbation on Fasenra.  Plan- continue Fasenra. He would like to change to self-injection at home and would be reliable enough to manage this, so we will start that change.

## 2018-10-01 NOTE — Assessment & Plan Note (Addendum)
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 Doing well with Dr Cindra Eves help/ Endocrinology

## 2018-10-05 MED ORDER — FASENRA PEN 30 MG/ML ~~LOC~~ SOAJ: 30.0000 mg | SUBCUTANEOUS | 11 refills | Status: DC

## 2018-10-05 NOTE — Telephone Encounter (Signed)
Rx has been sent in for Fasenra Pen. Nothing further was needed.

## 2018-10-18 ENCOUNTER — Telehealth: Payer: Self-pay | Admitting: Internal Medicine

## 2018-10-18 NOTE — Telephone Encounter (Signed)
Disability forms from [employer] have been reviewed and signed by CY and returned to [facility] per FMLA protocol. Nothing further needed at this time.

## 2018-10-19 NOTE — Telephone Encounter (Signed)
Rec'd completed forms back - Fwd to [employer] via interoffice mail -pr

## 2018-10-21 ENCOUNTER — Telehealth: Payer: Self-pay | Admitting: Internal Medicine

## 2018-10-21 NOTE — Telephone Encounter (Signed)
Called and spoke with Patient. Patient requested appointment for next fasenra injection ordered every 8 weeks.  Last injection, 08/23/18.  Scheduled injection for 10/26/18, at 1030.  Medication here. Patient stated her has Epipen and understands to bring it to each injection.

## 2018-10-25 ENCOUNTER — Telehealth: Payer: Self-pay | Admitting: Internal Medicine

## 2018-10-25 DIAGNOSIS — J4550 Severe persistent asthma, uncomplicated: Secondary | ICD-10-CM

## 2018-10-25 NOTE — Telephone Encounter (Signed)
Spoke with pt, he is on [place] and he wants to request CY order a cbc with diff or eosinophil count. He wants to see if they are low in numbers because he still feels like he is having some allergies. CY please advise.   Current Outpatient Medications on File Prior to Visit  Medication Sig Dispense Refill  . albuterol (PROVENTIL) (2.5 MG/3ML) 0.083% nebulizer solution USE 1 VIAL VIA NEBULIZER 3  TO 4 TIMES DAILY 1125 mL 1  . albuterol (VENTOLIN HFA) 108 (90 Base) MCG/ACT inhaler 2 puffs four times daily as needed 3 Inhaler 3  . ALPRAZolam (XANAX) 0.5 MG tablet Take 1 tablet (0.5 mg total) by mouth 2 (two) times daily as needed. 180 tablet 1  . Benralizumab (FASENRA PEN) 30 MG/ML SOAJ Inject 30 mg into the skin every 8 (eight) weeks. 1 pen 11  . Calcium Carbonate-Vitamin D (CALCIUM 600+D) 600-200 MG-UNIT TABS Take 1 tablet by mouth 2 (two) times daily.      . citalopram (CELEXA) 20 MG tablet Take 20 mg by mouth daily.    Marland Kitchen FASENRA 30 MG/ML SOSY     . fluticasone (FLONASE) 50 MCG/ACT nasal spray Use 1 to 2 sprays in each  nostril daily at bedtime as needed 48 g 3  . hydrocortisone (CORTEF) 10 MG tablet Take 20 mg by mouth daily.    Marland Kitchen ibuprofen (ADVIL) 800 MG tablet Take 1 tablet (800 mg total) by mouth 3 (three) times daily. 21 tablet 0  . ipratropium (ATROVENT) 0.02 % nebulizer solution USE 1 VIAL VIA NEBULIZER  EVERY 6 HOURS (Patient taking differently: USE 1 VIAL VIA NEBULIZER  EVERY 6 HOURS PRN) 937.5 mL 6  . Magnesium Cl-Calcium Carbonate (SLOW-MAG PO) Take 2 tablets by mouth daily.    . Nebulizers (COMPRESSOR NEBULIZER) MISC 1 Device by Does not apply route once. 1 each 0  . oxyCODONE-acetaminophen (PERCOCET) 5-325 MG tablet Take 1 tablet by mouth every 6 (six) hours as needed. 15 tablet 0  . pantoprazole (PROTONIX) 20 MG tablet Take 1 tablet (20 mg total) by mouth daily. 90 tablet 3  . predniSONE (DELTASONE) 1 MG tablet Take 1 tablet (1 mg total) by mouth daily with breakfast. (Patient  taking differently: Take 1.5 mg by mouth 2 (two) times a day. ) 50 tablet 2  . predniSONE (DELTASONE) 5 MG tablet Take 5 mg by mouth daily with breakfast.    . Respiratory Therapy Supplies (FLUTTER) DEVI Blow through 3 times per set, three times daily 1 each 0  . risedronate (ACTONEL) 150 MG tablet Take 1 tablet by mouth every 30 (thirty) days.     . SYMBICORT 160-4.5 MCG/ACT inhaler USE 2 PUFFS TWO TIMES DAILY RINSE MOUTH AFTER 30.6 g 6  . tamsulosin (FLOMAX) 0.4 MG CAPS capsule Take 1 capsule (0.4 mg total) by mouth daily. 15 capsule 0   No current facility-administered medications on file prior to visit.    Allergies  Allergen Reactions  . Erythromycin Ethylsuccinate     REACTION: unspecified  . Penicillins

## 2018-10-25 NOTE — Telephone Encounter (Signed)
Please order CBC w diff and total IgE  For dx severe persistent asthma

## 2018-10-26 ENCOUNTER — Other Ambulatory Visit: Payer: Self-pay

## 2018-10-26 ENCOUNTER — Ambulatory Visit (INDEPENDENT_AMBULATORY_CARE_PROVIDER_SITE_OTHER): Payer: Managed Care, Other (non HMO)

## 2018-10-26 DIAGNOSIS — J4550 Severe persistent asthma, uncomplicated: Secondary | ICD-10-CM

## 2018-10-26 LAB — CBC WITH DIFFERENTIAL/PLATELET
Basophils Absolute: 0 10*3/uL (ref 0.0–0.1)
Basophils Relative: 0 % (ref 0.0–3.0)
Eosinophils Absolute: 0 10*3/uL (ref 0.0–0.7)
Eosinophils Relative: 0 % (ref 0.0–5.0)
HCT: 43.9 % (ref 39.0–52.0)
Hemoglobin: 14.7 g/dL (ref 13.0–17.0)
Lymphocytes Relative: 36.7 % (ref 12.0–46.0)
Lymphs Abs: 2.3 10*3/uL (ref 0.7–4.0)
MCHC: 33.4 g/dL (ref 30.0–36.0)
MCV: 93.4 fl (ref 78.0–100.0)
Monocytes Absolute: 0.8 10*3/uL (ref 0.1–1.0)
Monocytes Relative: 11.8 % (ref 3.0–12.0)
Neutro Abs: 3.3 10*3/uL (ref 1.4–7.7)
Neutrophils Relative %: 51.5 % (ref 43.0–77.0)
Platelets: 257 10*3/uL (ref 150.0–400.0)
RBC: 4.71 Mil/uL (ref 4.22–5.81)
RDW: 13.4 % (ref 11.5–15.5)
WBC: 6.4 10*3/uL (ref 4.0–10.5)

## 2018-10-26 MED ORDER — BENRALIZUMAB 30 MG/ML ~~LOC~~ SOSY
30.0000 mg | PREFILLED_SYRINGE | Freq: Once | SUBCUTANEOUS | Status: AC
  Administered 2018-10-26: 30 mg via SUBCUTANEOUS

## 2018-10-26 NOTE — Progress Notes (Signed)
Have you been hospitalized within the last 10 days?  No Do you have a fever?  No Do you have a cough?  No Do you have a headache or sore throat? No Do you have your Epi Pen visible and is it within date?  Yes 

## 2018-10-26 NOTE — Telephone Encounter (Signed)
Called and spoke with pt to let him know we have ordered CBC w diff and total IgE labs for him. Pt verbalized understanding and stated he will be coming in to have these drawn before his injection at 10:30 AM. Pt is aware that he does not need an appt for labs. Nothing further needed at this time.

## 2018-10-27 LAB — IGE: IgE (Immunoglobulin E), Serum: 287 kU/L — ABNORMAL HIGH (ref <=114)

## 2018-12-09 ENCOUNTER — Other Ambulatory Visit: Payer: Self-pay | Admitting: Internal Medicine

## 2018-12-09 DIAGNOSIS — J449 Chronic obstructive pulmonary disease, unspecified: Secondary | ICD-10-CM

## 2018-12-14 ENCOUNTER — Telehealth: Payer: Self-pay | Admitting: Internal Medicine

## 2018-12-14 NOTE — Telephone Encounter (Signed)
Fasenra Shipment Received:  30mg  #1 prefilled syringe Medication arrival date: 12/14/18 Lot #: [id number] Exp date: 12/09/2019 Received by: Elliot Dally

## 2018-12-16 ENCOUNTER — Telehealth: Payer: Self-pay | Admitting: Internal Medicine

## 2018-12-16 NOTE — Telephone Encounter (Signed)
Spoke with pt's wife. Pt has been scheduled for his Fasenra injection on 12/20/2018 at 1015. Nothing further was needed.

## 2018-12-20 ENCOUNTER — Other Ambulatory Visit: Payer: Self-pay

## 2018-12-20 ENCOUNTER — Ambulatory Visit (INDEPENDENT_AMBULATORY_CARE_PROVIDER_SITE_OTHER): Payer: Managed Care, Other (non HMO)

## 2018-12-20 DIAGNOSIS — Z23 Encounter for immunization: Secondary | ICD-10-CM | POA: Diagnosis not present

## 2018-12-20 DIAGNOSIS — J4550 Severe persistent asthma, uncomplicated: Secondary | ICD-10-CM | POA: Diagnosis not present

## 2018-12-20 MED ORDER — BENRALIZUMAB 30 MG/ML ~~LOC~~ SOSY
30.0000 mg | PREFILLED_SYRINGE | Freq: Once | SUBCUTANEOUS | Status: AC
  Administered 2018-12-20: 10:00:00 30 mg via SUBCUTANEOUS

## 2018-12-20 NOTE — Progress Notes (Signed)
Have you been hospitalized within the last 10 days?  No Do you have a fever?  No Do you have a cough?  No Do you have a headache or sore throat? No Do you have your Epi Pen visible and is it within date?  Yes 

## 2018-12-20 NOTE — Addendum Note (Signed)
Addended by: Elton Sin on: 12/20/2018 10:15 AM   Modules accepted: Orders

## 2019-01-05 ENCOUNTER — Ambulatory Visit: Payer: Managed Care, Other (non HMO) | Admitting: Internal Medicine

## 2019-01-05 ENCOUNTER — Encounter: Payer: Self-pay | Admitting: Internal Medicine

## 2019-01-05 ENCOUNTER — Telehealth: Payer: Self-pay | Admitting: Internal Medicine

## 2019-01-05 ENCOUNTER — Other Ambulatory Visit: Payer: Self-pay

## 2019-01-05 VITALS — BP 130/78 | HR 77 | Temp 97.7°F | Ht 68.0 in | Wt 168.8 lb

## 2019-01-05 DIAGNOSIS — J441 Chronic obstructive pulmonary disease with (acute) exacerbation: Secondary | ICD-10-CM | POA: Diagnosis not present

## 2019-01-05 DIAGNOSIS — J45901 Unspecified asthma with (acute) exacerbation: Secondary | ICD-10-CM | POA: Diagnosis not present

## 2019-01-05 DIAGNOSIS — E279 Disorder of adrenal gland, unspecified: Secondary | ICD-10-CM

## 2019-01-05 DIAGNOSIS — J449 Chronic obstructive pulmonary disease, unspecified: Secondary | ICD-10-CM

## 2019-01-05 DIAGNOSIS — J330 Polyp of nasal cavity: Secondary | ICD-10-CM | POA: Diagnosis not present

## 2019-01-05 LAB — CBC WITH DIFFERENTIAL/PLATELET
Basophils Absolute: 0 10*3/uL (ref 0.0–0.1)
Basophils Relative: 0.1 % (ref 0.0–3.0)
Eosinophils Absolute: 0 10*3/uL (ref 0.0–0.7)
Eosinophils Relative: 0 % (ref 0.0–5.0)
HCT: 46.9 % (ref 39.0–52.0)
Hemoglobin: 15.6 g/dL (ref 13.0–17.0)
Lymphocytes Relative: 27.9 % (ref 12.0–46.0)
Lymphs Abs: 2.1 10*3/uL (ref 0.7–4.0)
MCHC: 33.3 g/dL (ref 30.0–36.0)
MCV: 92.1 fl (ref 78.0–100.0)
Monocytes Absolute: 0.9 10*3/uL (ref 0.1–1.0)
Monocytes Relative: 11.4 % (ref 3.0–12.0)
Neutro Abs: 4.5 10*3/uL (ref 1.4–7.7)
Neutrophils Relative %: 60.6 % (ref 43.0–77.0)
Platelets: 269 10*3/uL (ref 150.0–400.0)
RBC: 5.09 Mil/uL (ref 4.22–5.81)
RDW: 13.1 % (ref 11.5–15.5)
WBC: 7.5 10*3/uL (ref 4.0–10.5)

## 2019-01-05 LAB — SEDIMENTATION RATE: Sed Rate: 15 mm/hr (ref 0–20)

## 2019-01-05 MED ORDER — METHYLPREDNISOLONE ACETATE 80 MG/ML IJ SUSP
80.0000 mg | Freq: Once | INTRAMUSCULAR | Status: AC
  Administered 2019-01-05: 11:00:00 80 mg via INTRAMUSCULAR

## 2019-01-05 MED ORDER — BREZTRI AEROSPHERE 160-9-4.8 MCG/ACT IN AERO: 2.0000 | INHALATION_SPRAY | Freq: Two times a day (BID) | RESPIRATORY_TRACT | 0 refills | Status: DC

## 2019-01-05 NOTE — Telephone Encounter (Signed)
Received fax confirmation that fax went through at 1254.

## 2019-01-05 NOTE — Assessment & Plan Note (Signed)
Adrenal insufficiency. Working with Endocrinology. May not be able to regain normal function. Continues hydrocortisone.

## 2019-01-05 NOTE — Progress Notes (Signed)
Patient ID: Eric [name], male    DOB: [birth year], 63 y.o.   MRN: [id number]  HPI male never smoker followed for chronic obstructive asthma, ended Xolair 08/28/2011, hx RML resection age 52, chronic sinus disease, restless legs, complicated by GERD  CF Sweat Chloride Test  Negative (remote) Office spirometry 04/08/12- severe obstructive airways disease- FVC 2.64/58%, FEV1 1.30/36%, FEV1/FVC 0.49, FEF 25-75% 0.50/14%.   CT chest 04/01/2016 showed emphysema and scarring with chronic bronchitis changes but no ILD and no bronchiectasis ---------------------------------------------------------------------------   09/30/2018-Virtual Visit via Telephone Note History of Present Illness: 63 year old male never smoker followed for COPD mixed type, ended Xolair 08/28/2011, On Fasenra.  history RML resection age 33, chronic sinus disease, restless legs, complicated by GERD Continues Fasenra inj- helps. Symbicort 160, Hydrocortisone maint . -----pt states breathing is at baseline, taking Fasenra q8w & inhalers as directed; reports hydrocortisone 1/2 x 10 mg tab twice daily-- He continues to work with [name]/ Endocrinology, managing his hydrocortisone. Infrequent need for rescue albuterol and no need for nebulizer in long time. Accepts some mild breakthrough wheeze as normal for him all his life, without significant flare. Credits Fasenra for making big difference.  He would like to change to self-injecting Fasenra at home. Discussed Covid signs and precautions- he is careful.   Observations/Objective: Faint wheeze audible during phone conversation but he sounded comfortable.  Assessment and Plan: COPD mixed type/ Asthma overlap. Reports [name] big help Chronic sinusitis- controlled currently Adrenal insufficiency- followed by [name] Follow Up Instructions:  1 year   01/05/2019- 63 year old male never smoker followed for COPD mixed type/ Chronic fixed asthma, ended Xolair 08/28/2011, On Fasenra.   history RML resection age 77, chronic sinus disease, restless legs, complicated by GERD, Adrenal Insufficiency,   ----- pt reports taking Fasenra every 2 months; however, is experiencing wheezing, shortness of breath, and seasonal allergies Self-injecting Fasenra at home Symbicort 160, neb Atrovent/ albuterol, Ventolin hfa,  Did really well last Fall with [name], but has had increased rhinitis and asthma symptoms x 6 months. Can't tell improvement after Fasenra dose. Fall season usually worse. Has been using Afrin and Nasalcrom.  Frequent neb or rescue. Working with [name] Endocrine. Can't get below Hydrocortisone 5 mg twice daily without much aching and stiffness.   Review of Systems-See HPI   + = positive Constitutional:   No weight loss, night sweats,  Fevers, chills, fatigue, lassitude. HEENT:   No headaches,  Difficulty swallowing,  Tooth/dental problems,  Sore throat,                No sneezing, itching, ear ache, +-nasal congestion, post nasal drip,  CV:   chest pain, orthopnea, PND, swelling in lower extremities, anasarca, dizziness, palpitations GI    Heartburn, indigestion, no-abdominal pain, nausea, vomiting, Resp:    +-productive cough,  + non-productive cough,  No coughing up of blood.  No change in color of mucus.                                       wheezing. +Short of breath mainly w/ exertion.  Skin: no rash or lesions. GU:  MS:  No joint pain or swelling. Marland Kitchen Psych:  No change in mood or affect. No depression or anxiety.  No memory loss.  Objective:   Physical Exam General- Alert, Oriented, Affect-appropriate, Distress- none acute,  Looks well-Not cushingoid  Skin- rash-none, lesions- none,  excoriation- none,  Lymphadenopathy- none Head- atraumatic            Eyes- Gross vision intact, PERRLA, conjunctivae- clear secretions            Ears- Hearing, canals normal            Nose- +  Marked turbinate edema, ? Polyp visible R nare, no-Septal dev, erosion, perforation              Throat- Mallampati II , mucosa clear , drainage- none, tonsils- atrophic.  Neck- flexible , trachea midline, no stridor , thyroid nl, carotid no bruit Chest - symmetrical excursion , unlabored           Heart/CV- RRR , no murmur , no gallop  , no rub, nl s1 s2                           - JVD- none , edema- none, stasis changes- none, varices- none           Lung-    Wheeze+ unlabored , able to speak full sentences. Cough - none,                                          dullness-none, rub- none.            Chest wall-  Abd- Br/ Gen/ Rectal- Not done, not indicated Extrem- cyanosis- none, clubbing, none, atrophy- none, strength- nl Neuro- grossly intact to observation

## 2019-01-05 NOTE — Assessment & Plan Note (Addendum)
Eric [name] not helping enough. Not clear what has changed since he is careful about exposures. Plan- change to Dupixent. Try samples Breztri instead of Symbicort.  Depo medrol  Watch for Churg-Strauss. Labs for Eos, ANCA, IgE

## 2019-01-05 NOTE — Telephone Encounter (Signed)
Dupixent enrollment forms were brought to me by Annie Paras, LPN. Forms and insurance cards have been faxed to [place] [employer]. Will await summary of benefits.

## 2019-01-05 NOTE — Assessment & Plan Note (Signed)
Possible recurrence Plan - depo 80

## 2019-01-05 NOTE — Patient Instructions (Signed)
Order- Depo 80  Exacerbation Asthma COPD overlap  Sample x 2 Breztri     Inhale 2 puffs then rinse mouth, twice daily   Try this instead of Symbicort for comparison  Order- initiate Dupixent to replace [name] if approved  Lab- CBC w diff, Sed rate, ANA, IgE

## 2019-01-06 LAB — ANA: Anti Nuclear Antibody (ANA): NEGATIVE

## 2019-01-06 LAB — IGE: IgE (Immunoglobulin E), Serum: 383 kU/L — ABNORMAL HIGH (ref <=114)

## 2019-01-06 MED ORDER — DUPIXENT 300 MG/2ML ~~LOC~~ SOSY: 600.0000 mg | PREFILLED_SYRINGE | Freq: Once | SUBCUTANEOUS | 0 refills | Status: AC

## 2019-01-06 MED ORDER — DUPIXENT 300 MG/2ML ~~LOC~~ SOSY: 300.0000 mg | PREFILLED_SYRINGE | SUBCUTANEOUS | 12 refills | Status: DC

## 2019-01-06 NOTE — Telephone Encounter (Signed)
Received summary of benefits. PA is required for Dupixent. Will need to contact [facility] at [phone]. Specialty Pharm[facility] will be [facility].  Contacted [facility] at above number. PA could not be completed over the phone. I was given a Key to do the PA on Cover My Meds. Key: [id number]. PA has been started. Will continue to follow up.

## 2019-01-06 NOTE — Telephone Encounter (Signed)
Received a fax from [facility]. Dupixent has been approved 01/06/2019 - 07/07/2019. Rx for loading dose and maintenance dose has been sent to [facility]. Will schedule shipment once prescription has been processed.

## 2019-01-10 NOTE — Telephone Encounter (Signed)
Attempted to call [facility] to set up shipment. Was placed on a 15+ hold with no one coming to the line. Will try back.

## 2019-01-11 NOTE — Telephone Encounter (Signed)
Called [facility] to set up shipment. Spoke with [place]. Was advised that the pt's wife called and set up shipment to come to their home.  LMTCB x1 for pt.

## 2019-01-13 NOTE — Telephone Encounter (Signed)
Spoke with pt. Advised him that when he received his medication to NOT ADMINISTER the medication to himself. Pt verbalized understanding. He will call us when he receives it to make his first appointment.

## 2019-01-13 NOTE — Telephone Encounter (Signed)
Pt returned missed call and would like a call back 

## 2019-01-14 ENCOUNTER — Telehealth: Payer: Self-pay | Admitting: Internal Medicine

## 2019-01-14 NOTE — Telephone Encounter (Signed)
Spoke with pt. He has been scheduled for his first [place] on 01/20/2019 at 1330. Pt is aware of our office policy when it comes to their first injection >> 2 hours wait, Epipen.  He already has an Epipen on hand. Nothing further was needed.

## 2019-01-20 ENCOUNTER — Other Ambulatory Visit: Payer: Self-pay

## 2019-01-20 ENCOUNTER — Ambulatory Visit (INDEPENDENT_AMBULATORY_CARE_PROVIDER_SITE_OTHER): Payer: Managed Care, Other (non HMO)

## 2019-01-20 DIAGNOSIS — J4550 Severe persistent asthma, uncomplicated: Secondary | ICD-10-CM | POA: Diagnosis not present

## 2019-01-20 MED ORDER — DUPILUMAB 300 MG/2ML ~~LOC~~ SOSY
600.0000 mg | PREFILLED_SYRINGE | Freq: Once | SUBCUTANEOUS | Status: AC
  Administered 2019-01-20: 14:00:00 600 mg via SUBCUTANEOUS

## 2019-01-20 NOTE — Progress Notes (Signed)
Patient presented to the office today for first-time Dupixent injection.  Primary Pulmonologist: [name] MD Medication name: Dupixent Strength: 600mg  Site(s): R Arm  Epi pen/Auvi-Q visible during appointment: Yes  Time of injection: 1345  Patient evaluated every 15-20 minutes per protocol x2 hours.  1st check: 1400 Evaluation: No Reaction  2nd check: 1420 Evaluation: No Reaction  3rd check: 1440 Evaluation: No Reaction  4th check: 1500 Evaluation: No Reaction  5th check: 1520 Evaluation: No Reaction  6th check: 1545 Evaluation: No Reaction

## 2019-02-01 ENCOUNTER — Telehealth: Payer: Self-pay

## 2019-02-01 NOTE — Telephone Encounter (Signed)
DupixentShipment Ordered:  300mg  #1 prefilled syringe Medication order date: 02/01/19  Medication arrival date: 02/02/19  Len Blalock, [place]

## 2019-02-02 NOTE — Telephone Encounter (Signed)
Dupixent Shipment Received: 300mg  #2 prefilled syringe Medication arrival date: 02/02/19  Lot #: [id number] Exp date: 06/08/2021 Received by: Len Blalock, CMA

## 2019-02-07 ENCOUNTER — Other Ambulatory Visit: Payer: Self-pay

## 2019-02-07 ENCOUNTER — Ambulatory Visit (INDEPENDENT_AMBULATORY_CARE_PROVIDER_SITE_OTHER): Payer: Managed Care, Other (non HMO)

## 2019-02-07 DIAGNOSIS — J4550 Severe persistent asthma, uncomplicated: Secondary | ICD-10-CM

## 2019-02-07 MED ORDER — DUPILUMAB 300 MG/2ML ~~LOC~~ SOSY
300.0000 mg | PREFILLED_SYRINGE | Freq: Once | SUBCUTANEOUS | Status: AC
  Administered 2019-02-07: 12:00:00 300 mg via SUBCUTANEOUS

## 2019-02-07 NOTE — Progress Notes (Signed)
Have you been hospitalized within the last 10 days?  No Do you have a fever?  No Do you have a cough?  No Do you have a headache or sore throat? No Do you have your Epi Pen visible and is it within date?  No 

## 2019-02-21 ENCOUNTER — Ambulatory Visit (INDEPENDENT_AMBULATORY_CARE_PROVIDER_SITE_OTHER): Payer: Managed Care, Other (non HMO)

## 2019-02-21 ENCOUNTER — Telehealth: Payer: Self-pay | Admitting: Internal Medicine

## 2019-02-21 ENCOUNTER — Other Ambulatory Visit: Payer: Self-pay

## 2019-02-21 DIAGNOSIS — J4550 Severe persistent asthma, uncomplicated: Secondary | ICD-10-CM | POA: Diagnosis not present

## 2019-02-21 MED ORDER — DUPILUMAB 300 MG/2ML ~~LOC~~ SOSY
300.0000 mg | PREFILLED_SYRINGE | Freq: Once | SUBCUTANEOUS | Status: AC
  Administered 2019-02-21: 300 mg via SUBCUTANEOUS

## 2019-02-21 NOTE — Progress Notes (Signed)
All questions were answered by the patient before medication was administered. Have you been hospitalized in the last 10 days? No Do you have a fever? No Do you have a cough? No Do you have a headache or sore throat? No  

## 2019-02-21 NOTE — Telephone Encounter (Signed)
Spoke with pt. He is aware that his medication is here. Nothing further was needed.

## 2019-02-21 NOTE — Telephone Encounter (Signed)
Sorry - I meant to send to injection pool - please disregard for triage - FOR INJECTION POOL

## 2019-02-22 ENCOUNTER — Telehealth: Payer: Self-pay | Admitting: Internal Medicine

## 2019-02-22 NOTE — Telephone Encounter (Signed)
Dupixent Shipment Received: 300mg  #2 prefilled syringe Medication arrival date: 02/22/2019 Lot #: [id number] Exp date: 07/2021 Received by: Desmond Dike, [place]

## 2019-03-07 ENCOUNTER — Other Ambulatory Visit: Payer: Self-pay

## 2019-03-07 ENCOUNTER — Ambulatory Visit (INDEPENDENT_AMBULATORY_CARE_PROVIDER_SITE_OTHER): Payer: Managed Care, Other (non HMO)

## 2019-03-07 DIAGNOSIS — J4550 Severe persistent asthma, uncomplicated: Secondary | ICD-10-CM | POA: Diagnosis not present

## 2019-03-07 MED ORDER — DUPILUMAB 300 MG/2ML ~~LOC~~ SOSY
300.0000 mg | PREFILLED_SYRINGE | Freq: Once | SUBCUTANEOUS | Status: AC
  Administered 2019-03-07: 300 mg via SUBCUTANEOUS

## 2019-03-07 NOTE — Progress Notes (Signed)
Have you been hospitalized within the last 10 days?  No Do you have a fever?  No Do you have a cough?  No Do you have a headache or sore throat? No Do you have your Epi Pen visible and is it within date?  Yes 

## 2019-03-21 ENCOUNTER — Other Ambulatory Visit: Payer: Self-pay

## 2019-03-21 ENCOUNTER — Ambulatory Visit (INDEPENDENT_AMBULATORY_CARE_PROVIDER_SITE_OTHER): Payer: Managed Care, Other (non HMO)

## 2019-03-21 DIAGNOSIS — J4550 Severe persistent asthma, uncomplicated: Secondary | ICD-10-CM | POA: Diagnosis not present

## 2019-03-21 MED ORDER — DUPILUMAB 300 MG/2ML ~~LOC~~ SOSY
300.0000 mg | PREFILLED_SYRINGE | Freq: Once | SUBCUTANEOUS | Status: AC
  Administered 2019-03-21: 300 mg via SUBCUTANEOUS

## 2019-03-21 MED ORDER — EPINEPHRINE 0.3 MG/0.3ML IJ SOAJ: 0.3000 mg | Freq: Once | INTRAMUSCULAR | 11 refills | Status: DC

## 2019-03-21 NOTE — Progress Notes (Signed)
Have you been hospitalized within the last 10 days?  No Do you have a fever?  No Do you have a cough?  No Do you have a headache or sore throat? No Do you have your Epi Pen visible and is it within date?  Yes 

## 2019-03-28 ENCOUNTER — Telehealth: Payer: Self-pay | Admitting: Internal Medicine

## 2019-03-28 NOTE — Telephone Encounter (Signed)
Called [facility] to set up [place] delivery, spoke with Lorre Nick.  Lorre Nick stated medication was shipped to Patient's home 03/22/19. I was placed on hold. Lorre Nick stated an era on [facility] sent medication to Patient's home address.  Lucy called and verified Patient had Dupixent at home in refrigerator.  Patient instructed to bring to office 03/28/19 for injection.

## 2019-03-31 ENCOUNTER — Encounter: Payer: Self-pay | Admitting: Internal Medicine

## 2019-03-31 ENCOUNTER — Ambulatory Visit: Payer: Managed Care, Other (non HMO) | Admitting: Internal Medicine

## 2019-03-31 ENCOUNTER — Ambulatory Visit (INDEPENDENT_AMBULATORY_CARE_PROVIDER_SITE_OTHER): Payer: Managed Care, Other (non HMO)

## 2019-03-31 ENCOUNTER — Other Ambulatory Visit: Payer: Self-pay

## 2019-03-31 VITALS — BP 106/68 | HR 77 | Temp 97.4°F | Ht 68.0 in | Wt 170.0 lb

## 2019-03-31 DIAGNOSIS — J449 Chronic obstructive pulmonary disease, unspecified: Secondary | ICD-10-CM

## 2019-03-31 DIAGNOSIS — J4550 Severe persistent asthma, uncomplicated: Secondary | ICD-10-CM | POA: Diagnosis not present

## 2019-03-31 DIAGNOSIS — E279 Disorder of adrenal gland, unspecified: Secondary | ICD-10-CM

## 2019-03-31 NOTE — Progress Notes (Signed)
Patient ID: Eric [name], male    DOB: 09/27/1955, 64 y.o.   MRN: [id number]  HPI male never smoker followed for chronic obstructive asthma, ended Eric [name] 08/28/2011, hx RML resection age 61, chronic sinus disease, restless legs, complicated by GERD  CF Sweat Chloride Test  Negative (remote) Office spirometry 04/08/12- severe obstructive airways disease- FVC 2.64/58%, FEV1 1.30/36%, FEV1/FVC 0.49, FEF 25-75% 0.50/14%.   CT chest 04/01/2016 showed emphysema and scarring with chronic bronchitis changes but no ILD and no bronchiectasis ---------------------------------------------------------------------------   09/30/2018-Virtual Visit via Telephone Note  History of Present Illness: 64 year old male never smoker followed for COPD mixed type, ended Eric [name] 08/28/2011, On Fasenra.  history RML resection age 64, chronic sinus disease, restless legs, complicated by GERD Continues Fasenra inj- helps. Symbicort 160, Hydrocortisone maint . -----pt states breathing is at baseline, taking Fasenra q8w & inhalers as directed; reports hydrocortisone 1/2 x 10 mg tab twice daily-- He continues to work with Eric [name]/ Endocrinology, managing his hydrocortisone. Infrequent need for rescue albuterol and no need for nebulizer in long time. Accepts some mild breakthrough wheeze as normal for him all his life, without significant flare. Credits Fasenra for making big difference.  He would like to change to self-injecting Fasenra at home. Discussed Covid signs and precautions- he is careful.   Observations/Objective: Faint wheeze audible during phone conversation but he sounded comfortable.  Assessment and Plan: COPD mixed type/ Asthma overlap. Reports Eric [name] big help Chronic sinusitis- controlled currently Adrenal insufficiency- followed by Eric [name] Follow Up Instructions:  1 year  03/31/19- 64 year old male never smoker followed for COPD mixed type, ended Eric [name] 08/28/2011, On Fasenra.  history RML resection  age 12, chronic sinus disease, restless legs, complicated by GERD Continues Eric [name]. Symbicort 160, Hydrocortisone maint .Neb Atrovent/ albuterol, albuterol hfa,  -----f/u Asthma/COPD  Eric [name] never gave him the initial marked improvement he noted when he firs t started Eric [name], and it seems to wear off with poorer control as time for next dose approaches. That happened with Eric [name] too, which is why he changed, but on the 2 month dosing interval for Fasenra, where Eric [name] is given every 2 weeks. He would like to go back to Eric [name]; Persistent wheeze and still productive cough clear mucus, but not as tight.  Nose remains clear. Did pretty well in November- usually a bad month.  Review of Systems-See HPI   + = positive Constitutional:   No weight loss, night sweats,  Fevers, chills, fatigue, lassitude. HEENT:   No headaches,  Difficulty swallowing,  Tooth/dental problems,  Sore throat,                No sneezing, itching, ear ache, +-nasal congestion, post nasal drip,  CV:   chest pain, orthopnea, PND, swelling in lower extremities, anasarca, dizziness, palpitations GI    Heartburn, indigestion, no-abdominal pain, nausea, vomiting, Resp:    +-productive cough,  + non-productive cough,  No coughing up of blood.  No change in color of mucus.                                       wheezing. +Short of breath mainly w/ exertion.  Skin: no rash or lesions. GU:  MS:  No joint pain or swelling. Marland Kitchen Psych:  No change in mood or affect. No depression or anxiety.  No memory loss.  Objective:   Physical Exam General- Alert, Oriented, Affect-appropriate, Distress- none  acute,  Looks well-Not cushingoid  Skin- rash-none, lesions- none, excoriation- none,  Lymphadenopathy- none Head- atraumatic            Eyes- Gross vision intact, PERRLA, conjunctivae- clear secretions            Ears- Hearing, canals normal            Nose- +  stuffy, no-Septal dev,  polyps, erosion, perforation             Throat-  Mallampati II , mucosa clear , drainage- none, tonsils- atrophic.  Neck- flexible , trachea midline, no stridor , thyroid nl, carotid no bruit Chest - symmetrical excursion , unlabored           Heart/CV- RRR , no murmur , no gallop  , no rub, nl s1 s2                           - JVD- none , edema- none, stasis changes- none, varices- none           Lung-    Wheeze - none , able to speak full sentences. Cough - none,                                          dullness-none, rub- none.            Chest wall-  Abd- Br/ Gen/ Rectal- Not done, not indicated Extrem- cyanosis- none, clubbing, none, atrophy- none, strength- nl Neuro- grossly intact to observation

## 2019-03-31 NOTE — Patient Instructions (Signed)
Order- CXR    Dx Asthma/ COPD overlap  Order- Start application to start [facility].- Can self inject at home             When [place] starts we can DC Dupixent  Please call as needed

## 2019-04-01 ENCOUNTER — Telehealth: Payer: Self-pay | Admitting: Internal Medicine

## 2019-04-01 NOTE — Telephone Encounter (Signed)
Received new start form for Fasenra from [name] MD. Form was signed and dated 04/01/19. Form and all required information was faxed by Keanu Frickey,LPN on [id number].  Patient is for home/self injections per CY.  Will sign off and await decision.

## 2019-04-02 NOTE — Assessment & Plan Note (Signed)
He can't taper below 1-2 mg daily hydrocortisone or he begins to ache badly. Working with [name] Endocrinology

## 2019-04-02 NOTE — Assessment & Plan Note (Signed)
Exacerbation over last month. Plan- CXR. Change back from [place] to [place].

## 2019-04-04 ENCOUNTER — Ambulatory Visit (INDEPENDENT_AMBULATORY_CARE_PROVIDER_SITE_OTHER): Payer: Managed Care, Other (non HMO)

## 2019-04-04 ENCOUNTER — Other Ambulatory Visit: Payer: Self-pay

## 2019-04-04 ENCOUNTER — Ambulatory Visit: Payer: Managed Care, Other (non HMO) | Admitting: Internal Medicine

## 2019-04-04 DIAGNOSIS — J4550 Severe persistent asthma, uncomplicated: Secondary | ICD-10-CM | POA: Diagnosis not present

## 2019-04-04 MED ORDER — DUPILUMAB 300 MG/2ML ~~LOC~~ SOSY
300.0000 mg | PREFILLED_SYRINGE | Freq: Once | SUBCUTANEOUS | Status: AC
  Administered 2019-04-04: 300 mg via SUBCUTANEOUS

## 2019-04-04 NOTE — Progress Notes (Signed)
All questions were answered by the patient before medication was administered. Have you been hospitalized in the last 10 days? No Do you have a fever? No Do you have a cough? No Do you have a headache or sore throat? No  

## 2019-04-05 NOTE — Telephone Encounter (Signed)
LMTCB x1 for pt.  

## 2019-04-05 NOTE — Telephone Encounter (Signed)
Pt returned call. Informed him of the recs per CY. Pt verbalized understanding and denied any further questions or concerns at this time.

## 2019-04-05 NOTE — Telephone Encounter (Signed)
Spoke with pt. He states that he wants to continue with Dupixent for now. Pt would like to be able to administer at home instead of coming to the office.  CY - please advise if you are okay with the pt staying with Dupixent and administering at home. Thanks.

## 2019-04-05 NOTE — Telephone Encounter (Signed)
Yes- ok to continue Dupixent and self inject at home

## 2019-04-05 NOTE — Telephone Encounter (Signed)
Received summary of benefits from [employer]. PA is needed, form was included with summary of benefits. This form has been filled out and submitted to the [employer]. Will await PA decision.

## 2019-04-05 NOTE — Telephone Encounter (Signed)
Patient states would like to stay on [place] instead of Faserna.  Patient phone number is [phone].

## 2019-04-18 ENCOUNTER — Other Ambulatory Visit: Payer: Self-pay

## 2019-04-18 ENCOUNTER — Ambulatory Visit (INDEPENDENT_AMBULATORY_CARE_PROVIDER_SITE_OTHER): Payer: Managed Care, Other (non HMO)

## 2019-04-18 DIAGNOSIS — J4550 Severe persistent asthma, uncomplicated: Secondary | ICD-10-CM

## 2019-04-18 MED ORDER — DUPILUMAB 300 MG/2ML ~~LOC~~ SOSY
300.0000 mg | PREFILLED_SYRINGE | Freq: Once | SUBCUTANEOUS | Status: AC
  Administered 2019-04-18: 10:00:00 300 mg via SUBCUTANEOUS

## 2019-04-18 NOTE — Progress Notes (Signed)
Have you been hospitalized within the last 10 days?  No Do you have a fever?  No Do you have a cough?  No Do you have a headache or sore throat? No Do you have your Epi Pen visible and is it within date?  Yes 

## 2019-04-28 ENCOUNTER — Ambulatory Visit: Payer: Managed Care, Other (non HMO) | Admitting: Internal Medicine

## 2019-05-02 ENCOUNTER — Ambulatory Visit (INDEPENDENT_AMBULATORY_CARE_PROVIDER_SITE_OTHER): Payer: Managed Care, Other (non HMO)

## 2019-05-02 ENCOUNTER — Other Ambulatory Visit: Payer: Self-pay

## 2019-05-02 DIAGNOSIS — J4550 Severe persistent asthma, uncomplicated: Secondary | ICD-10-CM

## 2019-05-02 MED ORDER — DUPILUMAB 300 MG/2ML ~~LOC~~ SOSY
300.0000 mg | PREFILLED_SYRINGE | Freq: Once | SUBCUTANEOUS | Status: AC
  Administered 2019-05-02: 300 mg via SUBCUTANEOUS

## 2019-05-02 NOTE — Progress Notes (Signed)
All questions were answered by the patient before medication was administered. Have you been hospitalized in the last 10 days? No Do you have a fever? No Do you have a cough? No Do you have a headache or sore throat? No  

## 2019-05-10 ENCOUNTER — Ambulatory Visit: Payer: Managed Care, Other (non HMO) | Admitting: Internal Medicine

## 2019-05-15 ENCOUNTER — Other Ambulatory Visit: Payer: Self-pay | Admitting: Internal Medicine

## 2019-05-15 DIAGNOSIS — J449 Chronic obstructive pulmonary disease, unspecified: Secondary | ICD-10-CM

## 2019-05-16 ENCOUNTER — Other Ambulatory Visit: Payer: Self-pay

## 2019-05-16 ENCOUNTER — Ambulatory Visit (INDEPENDENT_AMBULATORY_CARE_PROVIDER_SITE_OTHER): Payer: Managed Care, Other (non HMO)

## 2019-05-16 DIAGNOSIS — J4550 Severe persistent asthma, uncomplicated: Secondary | ICD-10-CM

## 2019-05-16 MED ORDER — DUPILUMAB 300 MG/2ML ~~LOC~~ SOSY
300.0000 mg | PREFILLED_SYRINGE | Freq: Once | SUBCUTANEOUS | Status: AC
  Administered 2019-05-16: 300 mg via SUBCUTANEOUS

## 2019-05-16 NOTE — Progress Notes (Signed)
All questions were answered by the patient before medication was administered. Have you been hospitalized in the last 10 days? No Do you have a fever? No Do you have a cough? No Do you have a headache or sore throat? No  

## 2019-05-17 NOTE — Telephone Encounter (Signed)
Last ov 1/21 Last refill 3/20

## 2019-05-18 NOTE — Telephone Encounter (Signed)
Alprazolam refill e-sent 

## 2019-05-23 ENCOUNTER — Telehealth: Payer: Self-pay | Admitting: Internal Medicine

## 2019-05-23 NOTE — Telephone Encounter (Signed)
lmtcb for pt to see if he has a contact person and number for [employer].

## 2019-05-23 NOTE — Telephone Encounter (Signed)
Will forward to Dr. Annamaria Boots to contact Dr. Holland Commons at [phone].

## 2019-05-23 NOTE — Telephone Encounter (Signed)
I called the given number  [phone] twice and got recording that "call couldn't be completed as dialed"

## 2019-05-24 ENCOUNTER — Telehealth: Payer: Self-pay | Admitting: Internal Medicine

## 2019-05-24 NOTE — Telephone Encounter (Signed)
I called [employer] as instructed today at [phone] and have left message for them to return my call to our office Re his disability status.

## 2019-05-24 NOTE — Telephone Encounter (Signed)
Dr. Annamaria Boots please call Dr. Holland Commons to discuss pt. [phone]

## 2019-05-24 NOTE — Telephone Encounter (Signed)
Patient returned call with [facility] update. The analyst that handles claims in on vacation this week. Another analyst will handle this all week. Corrected number for Dr. Annamaria Boots to call and ID number listed below.  Insured ID # [id number] Phone # [phone]  Message routed to Dr. Annamaria Boots

## 2019-05-24 NOTE — Telephone Encounter (Signed)
Called and spoke with Patient, and wife Mickel Baas. Patient is going to reach out to [facility] for best number for Dr. Annamaria Boots to call for Dr.Perdik.

## 2019-05-25 NOTE — Telephone Encounter (Signed)
Phone Call from Dr Ricky Stabs, [facility], to discuss work limitation. I emphasized FEV1 36%, chronic productive cough and wheeze, adrenal insufficient on maintenance with [name]. He should avoid exposure to respiratory irritants, like lab reagents/ odors. Ability to walk some at own pace on level ground, sit, use arms. More stable but never clear chest on [place].

## 2019-05-30 NOTE — Telephone Encounter (Signed)
Noted, thank you. Will close encounter since nothing further is needed.

## 2019-05-30 NOTE — Telephone Encounter (Signed)
I discussed with Dr Tanda Rockers last week.

## 2019-05-30 NOTE — Telephone Encounter (Signed)
Attempted to contact the [place] again at  [facility] ID # [id number] Phone # [phone] Had to leave another message to have a representative contact our office again.  Will route back to CY to try again when he has clinic time.  Thanks!

## 2019-06-01 ENCOUNTER — Telehealth: Payer: Self-pay | Admitting: Internal Medicine

## 2019-06-01 DIAGNOSIS — J449 Chronic obstructive pulmonary disease, unspecified: Secondary | ICD-10-CM

## 2019-06-01 MED ORDER — BUDESONIDE-FORMOTEROL FUMARATE 160-4.5 MCG/ACT IN AERO: 2.0000 | INHALATION_SPRAY | Freq: Two times a day (BID) | RESPIRATORY_TRACT | 3 refills | Status: DC

## 2019-06-01 MED ORDER — BUDESONIDE-FORMOTEROL FUMARATE 160-4.5 MCG/ACT IN AERO: INHALATION_SPRAY | RESPIRATORY_TRACT | 0 refills | Status: DC

## 2019-06-01 NOTE — Telephone Encounter (Signed)
Called and spoke to pt's wife. She states the pt needs Symbicort sent to local pharmacy since he is out but normally gets meds through [facility]. Advised her that we can send 30 day in to the local pharmacy and a year to the mail order pharmacy and there shouldn't be any issues. Rx sent to both local and mail order pharmacy. She verbalized understanding is aware to call office back if there are issues. Nothing further needed at this time.

## 2019-06-08 ENCOUNTER — Telehealth: Payer: Self-pay | Admitting: Pharmacy Technician

## 2019-06-08 NOTE — Telephone Encounter (Signed)
Received fax that patient's Dupixent PA is expiring.  Submitted a Prior Authorization request to [facility] for [place] via Cover My Meds. Will update once we receive a response.  (KeyKathyrn Drown) [id number]  3:15 PM Beatriz Chancellor, CPhT

## 2019-06-09 NOTE — Telephone Encounter (Signed)
Received notification from [facility] regarding a prior authorization for [place] PENS. Authorization has been APPROVED from 06/07/19 to 12/08/19.   Authorization # [id number] Phone # [phone]  For patient's next prescription, please send in [place].  2:45 PM Beatriz Chancellor, CPhT

## 2019-06-16 ENCOUNTER — Telehealth: Payer: Self-pay | Admitting: Internal Medicine

## 2019-06-17 NOTE — Telephone Encounter (Signed)
Patient prefers syringes.   Submitted an urgent Prior Authorization request to [facility] for [place] syringes via Cover My Meds. Will update once we receive a response.  (KeyMarland Kitchen [id number]) [id number]

## 2019-06-17 NOTE — Telephone Encounter (Signed)
[facility] and verified that patient is continuing with the syringe. Per [employer], new PA is valid for pens or syringes. Rep at the pharmacy said the patient's order is in process.  Called wife and advised.  2:11 PM Beatriz Chancellor, CPhT

## 2019-06-17 NOTE — Telephone Encounter (Signed)
Received message that no new authorization is required. [place] help desk and was advised that new [place] authorization is valid for pens or syringes. Will follow up early next week to see if patient's shipment has shipped.

## 2019-07-14 ENCOUNTER — Ambulatory Visit: Payer: Managed Care, Other (non HMO) | Admitting: Internal Medicine

## 2019-07-14 ENCOUNTER — Other Ambulatory Visit: Payer: Self-pay

## 2019-07-14 ENCOUNTER — Encounter: Payer: Self-pay | Admitting: Internal Medicine

## 2019-07-14 DIAGNOSIS — E279 Disorder of adrenal gland, unspecified: Secondary | ICD-10-CM | POA: Diagnosis not present

## 2019-07-14 DIAGNOSIS — J449 Chronic obstructive pulmonary disease, unspecified: Secondary | ICD-10-CM

## 2019-07-14 NOTE — Progress Notes (Signed)
Patient ID: Eric [name], male    DOB: April 13, 1955, 64 y.o.   MRN: [id number]  HPI male never smoker followed for chronic obstructive asthma, ended Xolair 08/28/2011, hx RML resection age 55, chronic sinus disease, restless legs, complicated by GERD  CF Sweat Chloride Test  Negative (remote) Office spirometry 04/08/12- severe obstructive airways disease- FVC 2.64/58%, FEV1 1.30/36%, FEV1/FVC 0.49, FEF 25-75% 0.50/14%.   CT chest 04/01/2016 showed emphysema and scarring with chronic bronchitis changes but no ILD and no bronchiectasis ---------------------------------------------------------------------------   03/31/19- 64 year old male never smoker followed for COPD mixed type, ended Xolair 08/28/2011, On Fasenra.  history RML resection age 82, chronic sinus disease, restless legs, complicated by GERD Continues Dupixent. Symbicort 160, Hydrocortisone maint .Neb Atrovent/ albuterol, albuterol hfa,  -----f/u Asthma/COPD  Dupixent never gave him the initial marked improvement he noted when he first started [place], and it seems to wear off with poorer control as time for next dose approaches. That happened with [name], which is why he changed, but on the 2 month dosing interval for Fasenra, where Dupixent is given every 2 weeks. He would like to go back to [place]; Persistent wheeze and still productive cough clear mucus, but not as tight.  Nose remains clear. Did pretty well in November- usually a bad month.  07/14/19- 64 year old male never smoker followed for COPD mixed type, ended Xolair 08/28/2011, On Fasenra.  history RML resection age 14, chronic sinus disease, Steroid dependent/ Adrenal Insufficiency, restless legs, complicated by GERD Continues Dupixent. Symbicort 160, Hydrocortisone maint .Neb Atrovent/ albuterol, albuterol hfa,  ------f/u Servere persistent asthma/COPD Started self-injection Dupixent. Has not used nebulizer in a year, but puts up with chronic wheeze.  Breztri not better  than Symbicort Steroid dose now is hydrocortisone 15 mg daily maint per Endocrinology.  CXR 03/31/19-  . No acute cardiopulmonary disease.   Review of Systems-See HPI   + = positive Constitutional:   No weight loss, night sweats,  Fevers, chills, fatigue, lassitude. HEENT:   No headaches,  Difficulty swallowing,  Tooth/dental problems,  Sore throat,                No sneezing, itching, ear ache, +-nasal congestion, post nasal drip,  CV:   chest pain, orthopnea, PND, swelling in lower extremities, anasarca, dizziness, palpitations GI    Heartburn, indigestion, no-abdominal pain, nausea, vomiting, Resp:    +-productive cough,  + non-productive cough,  No coughing up of blood.  No change in color of mucus.                                       wheezing. +Short of breath mainly w/ exertion.  Skin: no rash or lesions. GU:  MS:  No joint pain or swelling. Marland Kitchen Psych:  No change in mood or affect. No depression or anxiety.  No memory loss.  Objective:   Physical Exam General- Alert, Oriented, Affect-appropriate, Distress- none acute,  Looks well-Not cushingoid  Skin- +echymoses on arms- chronic steroids  Lymphadenopathy- none Head- atraumatic            Eyes- Gross vision intact, PERRLA, conjunctivae- clear secretions            Ears- Hearing, canals normal            Nose- +  stuffy, no-Septal dev,  polyps, erosion, perforation  Throat- Mallampati II , mucosa clear , drainage- none, tonsils- atrophic.  Neck- flexible , trachea midline, no stridor , thyroid nl, carotid no bruit Chest - symmetrical excursion , unlabored           Heart/CV- RRR , no murmur , no gallop  , no rub, nl s1 s2                           - JVD- none , edema- none, stasis changes- none, varices- none           Lung-    Wheeze+ bilateral/ unlabored , able to speak full sentences. Cough - none,                                          dullness-none, rub- none.            Chest wall-  Abd- Br/ Gen/ Rectal- Not  done, not indicated Extrem- cyanosis- none, clubbing, none, atrophy- none, strength- nl Neuro- grossly intact to observation

## 2019-07-14 NOTE — Patient Instructions (Signed)
Sample x 2 Breztri inhaler    Inhale 2 puffs then rinse mouth, once daily Try this instead of Symbicort. When the samples run out, go back to Symbicort for comparison. If you like Breztri better, let us know.   You can discuss the easy  Bruising with Dr Laurann Montana. It may reflect chronic sun exposure and steroids, but there are other sorts of bleeding disorders that might do it.   Please cal if we can help

## 2019-07-29 ENCOUNTER — Ambulatory Visit: Payer: Managed Care, Other (non HMO) | Admitting: Internal Medicine

## 2019-08-19 NOTE — Assessment & Plan Note (Signed)
Chronic fixed asthma.  Plan- try more Breztri samples.Use nebulizer.

## 2019-08-19 NOTE — Assessment & Plan Note (Addendum)
Ecchymoses on forearms- solar distribution- likely combination of sun exposure and chronic steroids.  He can talk with Dr Laurann Montana about whether he needs clotting workup for eg [place], since a family member is similar.

## 2019-09-01 ENCOUNTER — Telehealth: Payer: Self-pay | Admitting: Internal Medicine

## 2019-09-01 NOTE — Telephone Encounter (Signed)
I suggest he skip next 2 Dupixent doses, or until rash goes away, then restart and see what happens.

## 2019-09-01 NOTE — Telephone Encounter (Signed)
Spoke with pt. He is aware of Dr. Janee Morn response. Nothing further was needed.

## 2019-09-01 NOTE — Telephone Encounter (Signed)
Spoke with Eric [name], he is having a rash and he thinks it is from the [place] injections. The rash started a couple months ago but it has been getting worse. He has been using Dupixent since 01/20/2019. CY please advise.  Current Outpatient Medications on File Prior to Visit  Medication Sig Dispense Refill  . albuterol (PROVENTIL) (2.5 MG/3ML) 0.083% nebulizer solution USE 1 VIAL VIA NEBULIZER 3  TO 4 TIMES DAILY 1125 mL 1  . albuterol (VENTOLIN HFA) 108 (90 Base) MCG/ACT inhaler 2 puffs four times daily as needed 3 Inhaler 3  . ALPRAZolam (XANAX) 0.5 MG tablet TAKE 1 TABLET BY MOUTH  TWICE DAILY AS NEEDED 180 tablet 1  . budesonide-formoterol (SYMBICORT) 160-4.5 MCG/ACT inhaler Inhale 2 puffs into the lungs in the morning and at bedtime. 3 Inhaler 3  . Calcium Carbonate-Vitamin D (CALCIUM 600+D) 600-200 MG-UNIT TABS Take 1 tablet by mouth 2 (two) times daily.      . cetirizine (ZYRTEC) 10 MG tablet Take 10 mg by mouth daily.    . citalopram (CELEXA) 20 MG tablet Take 20 mg by mouth daily.    . dupilumab (DUPIXENT) 300 MG/2ML prefilled syringe Inject 300 mg into the skin every 14 (fourteen) days. 4 mL 12  . fluticasone (FLONASE) 50 MCG/ACT nasal spray Use 1 to 2 sprays in each  nostril daily at bedtime as needed 48 g 3  . hydrocortisone (CORTEF) 10 MG tablet Take 15 mg by mouth daily.     Marland Kitchen ibuprofen (ADVIL) 800 MG tablet Take 1 tablet (800 mg total) by mouth 3 (three) times daily. 21 tablet 0  . ipratropium (ATROVENT) 0.02 % nebulizer solution USE 1 VIAL VIA NEBULIZER  EVERY 6 HOURS (Patient taking differently: USE 1 VIAL VIA NEBULIZER  EVERY 6 HOURS PRN) 937.5 mL 6  . Magnesium Cl-Calcium Carbonate (SLOW-MAG PO) Take 2 tablets by mouth daily.    . Nebulizers (COMPRESSOR NEBULIZER) MISC 1 Device by Does not apply route once. 1 each 0  . pantoprazole (PROTONIX) 20 MG tablet Take 1 tablet (20 mg total) by mouth daily. 90 tablet 3  . Respiratory Therapy Supplies (FLUTTER) DEVI Blow through 3 times per  set, three times daily 1 each 0  . risedronate (ACTONEL) 150 MG tablet Take 1 tablet by mouth every 30 (thirty) days.      No current facility-administered medications on file prior to visit.   Allergies  Allergen Reactions  . Erythromycin Ethylsuccinate     REACTION: unspecified  . Penicillins

## 2019-09-05 ENCOUNTER — Telehealth: Payer: Self-pay | Admitting: Internal Medicine

## 2019-09-05 NOTE — Telephone Encounter (Signed)
Spoke with wife, Eric [name] called last week about the dupixent possibly causing a rash. Dr. Annamaria Boots advised him to stop the next 2 dupixent injections but Eric [name] would like to switch to [place] or [name]. She states he is using his albuterol inhaler 3-4 times day now. CY please advise. If we can switch to another injection. He felt like the [name] did work but started to be ineffective after a while.   Current Outpatient Medications on File Prior to Visit  Medication Sig Dispense Refill  . albuterol (PROVENTIL) (2.5 MG/3ML) 0.083% nebulizer solution USE 1 VIAL VIA NEBULIZER 3  TO 4 TIMES DAILY 1125 mL 1  . albuterol (VENTOLIN HFA) 108 (90 Base) MCG/ACT inhaler 2 puffs four times daily as needed 3 Inhaler 3  . ALPRAZolam (XANAX) 0.5 MG tablet TAKE 1 TABLET BY MOUTH  TWICE DAILY AS NEEDED 180 tablet 1  . budesonide-formoterol (SYMBICORT) 160-4.5 MCG/ACT inhaler Inhale 2 puffs into the lungs in the morning and at bedtime. 3 Inhaler 3  . Calcium Carbonate-Vitamin D (CALCIUM 600+D) 600-200 MG-UNIT TABS Take 1 tablet by mouth 2 (two) times daily.      . cetirizine (ZYRTEC) 10 MG tablet Take 10 mg by mouth daily.    . citalopram (CELEXA) 20 MG tablet Take 20 mg by mouth daily.    . dupilumab (DUPIXENT) 300 MG/2ML prefilled syringe Inject 300 mg into the skin every 14 (fourteen) days. 4 mL 12  . fluticasone (FLONASE) 50 MCG/ACT nasal spray Use 1 to 2 sprays in each  nostril daily at bedtime as needed 48 g 3  . hydrocortisone (CORTEF) 10 MG tablet Take 15 mg by mouth daily.     Marland Kitchen ibuprofen (ADVIL) 800 MG tablet Take 1 tablet (800 mg total) by mouth 3 (three) times daily. 21 tablet 0  . ipratropium (ATROVENT) 0.02 % nebulizer solution USE 1 VIAL VIA NEBULIZER  EVERY 6 HOURS (Patient taking differently: USE 1 VIAL VIA NEBULIZER  EVERY 6 HOURS PRN) 937.5 mL 6  . Magnesium Cl-Calcium Carbonate (SLOW-MAG PO) Take 2 tablets by mouth daily.    . Nebulizers (COMPRESSOR NEBULIZER) MISC 1 Device by Does not apply route once. 1  each 0  . pantoprazole (PROTONIX) 20 MG tablet Take 1 tablet (20 mg total) by mouth daily. 90 tablet 3  . Respiratory Therapy Supplies (FLUTTER) DEVI Blow through 3 times per set, three times daily 1 each 0  . risedronate (ACTONEL) 150 MG tablet Take 1 tablet by mouth every 30 (thirty) days.      No current facility-administered medications on file prior to visit.   Allergies  Allergen Reactions  . Erythromycin Ethylsuccinate     REACTION: unspecified  . Penicillins

## 2019-09-05 NOTE — Telephone Encounter (Signed)
Please order DC Dupixent (rash) and change to [place]  for dx severe persistent asthma/ COPD overlap syndrome

## 2019-09-06 NOTE — Telephone Encounter (Signed)
Submitted a Prior Authorization request to [facility] for [place] via Cover My Meds. Will update once we receive a response.   (KeyDanton Clap) - [id number]

## 2019-09-07 MED ORDER — NUCALA 100 MG/ML ~~LOC~~ SOSY: 100.0000 mg | PREFILLED_SYRINGE | SUBCUTANEOUS | 11 refills | Status: DC

## 2019-09-07 NOTE — Telephone Encounter (Signed)
Received notification from [facility] regarding a prior authorization for [place] syringes. Authorization has been APPROVED from 09/06/19 to 03/07/20.   Authorization # [id number]  Per plan, patient must fill through [facility]. Patient can use a copay card.

## 2019-09-07 NOTE — Telephone Encounter (Signed)
Rx has been sent in for Nucala prefilled syringe.

## 2019-09-20 ENCOUNTER — Telehealth: Payer: Self-pay | Admitting: Internal Medicine

## 2019-09-20 NOTE — Telephone Encounter (Signed)
Spoke with pt's wife, Eric [name]. Advised her that the prescription for Nucala was sent to [facility]. Since this is a new prescription, they will have to be the ones to set up shipment per the pharmacy's policy. Their phone number has been given to pt's wife. Eric [name] is aware of our office policy when it comes to pt's first injection >> 2 hours wait, Epipen. Rx for Epipen is not needed as the pt already has one. They will call once they receive medication to set up first appointment. Nothing further needed at this time.

## 2019-09-21 NOTE — Telephone Encounter (Signed)
Returned wife's call. Patient and wife tried to enroll via [facility] portal ut received an error message. They called [facility] [place] and receive a new rep who stated he was her 1st patient she's enrolled.   Called [facility], they see patient in system but do not see where a copay card was generated. Was advised to check back tomorrow because it could be in process and if it does not show up by tomorrow to do the paper form.  Called and spoke to wife, patient will stop by office to sign [facility] form to prevent any further delay. Placed forms at front desk and provider form in box for signature. Will follow up.

## 2019-09-21 NOTE — Telephone Encounter (Signed)
Enrollment form faxed to [facility] My way. Will follow up.  Phone# [phone]

## 2019-09-21 NOTE — Telephone Encounter (Signed)
Spoke with pt's wife, Mickel Baas. Advised her that I would get her message over to our pharmacy team for help with patient assistance/copay assistance.

## 2019-09-23 NOTE — Telephone Encounter (Signed)
Called [facility] to check status of copay card. Application is in process. Will follow up on Monday.  Phone# [phone]

## 2019-09-26 NOTE — Telephone Encounter (Signed)
Benefits review is still in process.

## 2019-09-28 NOTE — Telephone Encounter (Signed)
Received fax that patient was approved for Nucala copay card. Patient will receive notification via email or text to activate copay card. Then they must provide copay debit card to their pharmacy.

## 2019-10-04 ENCOUNTER — Telehealth: Payer: Self-pay | Admitting: Internal Medicine

## 2019-10-05 ENCOUNTER — Other Ambulatory Visit: Payer: Self-pay | Admitting: Internal Medicine

## 2019-10-05 NOTE — Telephone Encounter (Signed)
[place] w/[facility].  Setting up delivery for Nucala.  Please advise. [phone]

## 2019-10-05 NOTE — Telephone Encounter (Signed)
Nucala Order: 100mg  #1 prefilled syringe Order Date: 10/05/2019 Expected date of arrival: 10/07/2019 Ordered by: Desmond Dike, [place]  Specialty Pharmacy: [facility]

## 2019-10-07 NOTE — Telephone Encounter (Signed)
Nucala Shipment Received: 100mg  #1 prefilled syringe Medication arrival date: 10/07/2019 Lot #: [id number] Exp date: 05/2020 Received by: Desmond Dike, [place]

## 2019-10-10 ENCOUNTER — Telehealth: Payer: Self-pay | Admitting: Internal Medicine

## 2019-10-10 NOTE — Telephone Encounter (Signed)
Patient has been approved for the co-pay for Nucala. Benefit information sent to Dr. Annamaria Boots.

## 2019-10-12 ENCOUNTER — Telehealth: Payer: Self-pay | Admitting: Internal Medicine

## 2019-10-12 NOTE — Telephone Encounter (Signed)
Spoke with pt's wife, Mickel Baas She is aware of our office policy when it comes to his first injection >> 2 hours wait, Epipen. Rx for Epipen is not needed as the pt already has one. Appointment has been scheduled for 10/24/2019 at 1015. Nothing further needed at this time.

## 2019-10-24 ENCOUNTER — Other Ambulatory Visit: Payer: Self-pay

## 2019-10-24 ENCOUNTER — Ambulatory Visit (INDEPENDENT_AMBULATORY_CARE_PROVIDER_SITE_OTHER): Payer: Managed Care, Other (non HMO)

## 2019-10-24 DIAGNOSIS — J449 Chronic obstructive pulmonary disease, unspecified: Secondary | ICD-10-CM

## 2019-10-24 DIAGNOSIS — J4489 Other specified chronic obstructive pulmonary disease: Secondary | ICD-10-CM

## 2019-10-24 MED ORDER — MEPOLIZUMAB 100 MG/ML ~~LOC~~ SOSY
100.0000 mg | PREFILLED_SYRINGE | Freq: Once | SUBCUTANEOUS | Status: AC
  Administered 2019-10-24: 100 mg via SUBCUTANEOUS

## 2019-10-24 NOTE — Progress Notes (Signed)
Patient presented to the office today for first-time Nucala injection.  Primary Pulmonologist: [name] MD Medication name: Nucala Strength: 100mg  Site(s): Right arm  Epi pen/Auvi-Q visible during appointment: Yes  Time of injection: 1020  Patient evaluated every 15-20 minutes per protocol x2 hours.  1st check: 1035 Evaluation: Patient sitting quietly in chair.  Denies any problems.  2nd check: 1050  Evaluation: Patient sitting in chair.  Patient denies any problem.  No redness or swelling at injection site.  3rd check: 1105  Evaluation: Patient sitting in chair.  Patient denies any side or adverse effects.  No redness or swelling at injection site.  4th check: 1120   Evaluation:Patient sitting quietly in chair.  Patient denies any side or adverse effects.  5th check: 1135  Evaluation: Patient sitting in chair.  Patient denies any problems.  No redness or swelling at injection site.  6th check: 1150  Evaluation: Patient sitting in chair. Patient denies any side or adverse effect.  No redness or swelling at injection site.  7th check: 1205  Evaluation: Patient sitting quietly in chair.  Patient denies problems.  8th check: 1220  Evaluation: Patient denies any side or adverse effects.  No redness or swelling at right arm  injection site.

## 2019-10-27 ENCOUNTER — Telehealth: Payer: Self-pay | Admitting: Internal Medicine

## 2019-10-31 NOTE — Telephone Encounter (Signed)
Nucala Order: 100mg  #1 Prefilled Syringe Order Date: 10/31/19 Expected date of arrival: 11/02/19 Ordered by: [place]: Lennette Bihari

## 2019-11-02 NOTE — Telephone Encounter (Signed)
Nucala Shipment Received: 100mg  #1 prefilled syringe Medication arrival date: 11/02/19 Lot #: [id number] Exp date: 12/07/2020 Received by: Elliot Dally

## 2019-11-22 ENCOUNTER — Ambulatory Visit (INDEPENDENT_AMBULATORY_CARE_PROVIDER_SITE_OTHER): Payer: Managed Care, Other (non HMO)

## 2019-11-22 ENCOUNTER — Other Ambulatory Visit: Payer: Self-pay

## 2019-11-22 DIAGNOSIS — J449 Chronic obstructive pulmonary disease, unspecified: Secondary | ICD-10-CM

## 2019-11-22 MED ORDER — MEPOLIZUMAB 100 MG/ML ~~LOC~~ SOSY
100.0000 mg | PREFILLED_SYRINGE | Freq: Once | SUBCUTANEOUS | Status: AC
  Administered 2019-11-22: 100 mg via SUBCUTANEOUS

## 2019-11-22 NOTE — Progress Notes (Signed)
Have you been hospitalized within the last 10 days?  No Do you have a fever?  No Do you have a cough?  No Do you have a headache or sore throat? No Do you have your Epi Pen visible and is it within date?  Yes 

## 2019-12-08 ENCOUNTER — Telehealth: Payer: Self-pay | Admitting: Internal Medicine

## 2019-12-08 NOTE — Telephone Encounter (Signed)
Spoke with Patient's Wife, Mickel Baas.  Patient Nucala injection 12/20/19 at 1030.

## 2019-12-12 ENCOUNTER — Telehealth: Payer: Self-pay | Admitting: Internal Medicine

## 2019-12-12 NOTE — Telephone Encounter (Signed)
Nucala Order: 100mg  #1 Vial Order Date: 12/12/19 Expected date of arrival: 12/14/19 Ordered by: [place]: [facility]

## 2019-12-14 NOTE — Telephone Encounter (Signed)
Nucala Shipment Received: 100mg  #1 vial Medication arrival date: 12/14/19 Lot #: [id number] Exp date: 12/07/2020 Received by: Elliot Dally

## 2019-12-20 ENCOUNTER — Telehealth: Payer: Self-pay | Admitting: Internal Medicine

## 2019-12-20 ENCOUNTER — Ambulatory Visit (INDEPENDENT_AMBULATORY_CARE_PROVIDER_SITE_OTHER): Payer: Managed Care, Other (non HMO)

## 2019-12-20 ENCOUNTER — Other Ambulatory Visit: Payer: Self-pay

## 2019-12-20 DIAGNOSIS — J4550 Severe persistent asthma, uncomplicated: Secondary | ICD-10-CM | POA: Diagnosis not present

## 2019-12-20 MED ORDER — NUCALA 100 MG/ML ~~LOC~~ SOAJ: 100.0000 mg | SUBCUTANEOUS | 11 refills | Status: DC

## 2019-12-20 MED ORDER — MEPOLIZUMAB 100 MG/ML ~~LOC~~ SOSY
100.0000 mg | PREFILLED_SYRINGE | Freq: Once | SUBCUTANEOUS | Status: AC
  Administered 2019-12-20: 100 mg via SUBCUTANEOUS

## 2019-12-20 NOTE — Telephone Encounter (Signed)
Received notification from [facility] regarding a prior authorization for [place] PEN. Authorization has been APPROVED from 12/20/19 to 06/19/20.   Authorization # [id number]

## 2019-12-20 NOTE — Telephone Encounter (Signed)
New Nucala prescription sent to [facility] for pen and home delivery.  Will follow up with Patient, and start self injection training at next injection appointment.

## 2019-12-20 NOTE — Telephone Encounter (Signed)
Submitted a Prior Authorization request to [facility] for [place] via Cover My Meds. Will update once we receive a response.   KeyTawanna Solo - PA Case ID: [id number]

## 2019-12-20 NOTE — Progress Notes (Signed)
Have you been hospitalized within the last 10 days?  No Do you have a fever?  No Do you have a cough?  No Do you have a headache or sore throat? No Do you have your Epi Pen visible and is it within date?  yes

## 2019-12-20 NOTE — Telephone Encounter (Signed)
Good work- thanks

## 2019-12-20 NOTE — Telephone Encounter (Signed)
Patient was seen in office today for monthly Nucala injection.  Patient stated he feels he is doing very well with Nucala, and requested home/self Nucala injections. Patient's next Nucala injection is scheduled for 01/17/20.  Message routed to Dr. Annamaria Boots and Pharmacy team to advise

## 2020-01-10 ENCOUNTER — Telehealth: Payer: Self-pay | Admitting: Internal Medicine

## 2020-01-10 NOTE — Telephone Encounter (Signed)
Spoke with Patient.  Patient scheduled 01/23/20 at 1430.

## 2020-01-13 ENCOUNTER — Telehealth: Payer: Self-pay | Admitting: Internal Medicine

## 2020-01-13 NOTE — Telephone Encounter (Signed)
ATC patient.  LM to call back. 

## 2020-01-16 ENCOUNTER — Ambulatory Visit: Payer: Managed Care, Other (non HMO) | Admitting: Internal Medicine

## 2020-01-17 ENCOUNTER — Ambulatory Visit: Payer: Managed Care, Other (non HMO)

## 2020-01-23 ENCOUNTER — Ambulatory Visit: Payer: Managed Care, Other (non HMO)

## 2020-01-23 ENCOUNTER — Telehealth: Payer: Self-pay | Admitting: Internal Medicine

## 2020-01-23 MED ORDER — BUDESONIDE-FORMOTEROL FUMARATE 160-4.5 MCG/ACT IN AERO: 2.0000 | INHALATION_SPRAY | Freq: Two times a day (BID) | RESPIRATORY_TRACT | 1 refills | Status: DC

## 2020-01-23 NOTE — Telephone Encounter (Signed)
Spoke with pt and advised that Symbicort 90 day supply refill was sent to [facility]. Nothing further needed at this time.

## 2020-01-25 NOTE — Telephone Encounter (Signed)
ATC Patient.  LM to call back. 

## 2020-01-25 NOTE — Telephone Encounter (Signed)
Patient returned call.  Patient stated he had an opportunity to go out of town, and did not want to miss his Nucala injection. Patient stated he followed directions and injected Nucala pen in his abdomen with no problems. Patient is aware of next Nucala dosage time, and specialty pharmacy delivery information. Nothing further at this time.

## 2020-01-26 ENCOUNTER — Telehealth: Payer: Self-pay | Admitting: Internal Medicine

## 2020-01-26 MED ORDER — DOXYCYCLINE HYCLATE 100 MG PO TABS: ORAL_TABLET | ORAL | 0 refills | Status: DC

## 2020-01-26 MED ORDER — PREDNISONE 10 MG PO TABS: ORAL_TABLET | ORAL | 0 refills | Status: DC

## 2020-01-26 NOTE — Telephone Encounter (Signed)
Spoke with the pt and notified of response per Dr Annamaria Boots  Pt verbalized understanding  Rxs were sent to pharm

## 2020-01-26 NOTE — Telephone Encounter (Signed)
Please offer prednisone 10 mg, # 20, 4 X 2 DAYS, 3 X 2 DAYS, 2 X 2 DAYS, 1 X 2 DAYS                        Doxycycline 100 mg, # 8, 2 today then one daily

## 2020-01-26 NOTE — Telephone Encounter (Signed)
Spoke with the pt  He is c/o chest tightness, wheezing and cough with clear sputum for the past wk  He has had some increased SOB for the past 2-3 days  Denies any f/c/s, body aches  He has been vaccinated against covid  Still on Nucala, symbicort, albuterol, atrovent  Please advise, thanks!  Allergies  Allergen Reactions  . Erythromycin Ethylsuccinate     REACTION: unspecified  . Penicillins    Current Outpatient Medications on File Prior to Visit  Medication Sig Dispense Refill  . albuterol (PROVENTIL) (2.5 MG/3ML) 0.083% nebulizer solution USE 1 VIAL VIA NEBULIZER 3  TO 4 TIMES DAILY 1125 mL 1  . albuterol (VENTOLIN HFA) 108 (90 Base) MCG/ACT inhaler USE 2 PUFFS BY MOUTH 4  TIMES DAILY AS NEEDED 72 g 3  . ALPRAZolam (XANAX) 0.5 MG tablet TAKE 1 TABLET BY MOUTH  TWICE DAILY AS NEEDED 180 tablet 1  . budesonide-formoterol (SYMBICORT) 160-4.5 MCG/ACT inhaler Inhale 2 puffs into the lungs in the morning and at bedtime. 30.6 g 1  . Calcium Carbonate-Vitamin D (CALCIUM 600+D) 600-200 MG-UNIT TABS Take 1 tablet by mouth 2 (two) times daily.      . cetirizine (ZYRTEC) 10 MG tablet Take 10 mg by mouth daily.    . citalopram (CELEXA) 20 MG tablet Take 20 mg by mouth daily.    . fluticasone (FLONASE) 50 MCG/ACT nasal spray Use 1 to 2 sprays in each  nostril daily at bedtime as needed 48 g 3  . hydrocortisone (CORTEF) 10 MG tablet Take 15 mg by mouth daily.     Marland Kitchen ibuprofen (ADVIL) 800 MG tablet Take 1 tablet (800 mg total) by mouth 3 (three) times daily. 21 tablet 0  . ipratropium (ATROVENT) 0.02 % nebulizer solution USE 1 VIAL VIA NEBULIZER  EVERY 6 HOURS (Patient taking differently: USE 1 VIAL VIA NEBULIZER  EVERY 6 HOURS PRN) 937.5 mL 6  . Magnesium Cl-Calcium Carbonate (SLOW-MAG PO) Take 2 tablets by mouth daily.    . Mepolizumab (NUCALA) 100 MG/ML SOAJ Inject 100 mg into the skin every 28 (twenty-eight) days. 1 mL 11  . mepolizumab (NUCALA) 100 MG/ML SOSY Inject 100 mg into the skin every 28  (twenty-eight) days. 1 mL 11  . Nebulizers (COMPRESSOR NEBULIZER) MISC 1 Device by Does not apply route once. 1 each 0  . pantoprazole (PROTONIX) 20 MG tablet Take 1 tablet (20 mg total) by mouth daily. 90 tablet 3  . Respiratory Therapy Supplies (FLUTTER) DEVI Blow through 3 times per set, three times daily 1 each 0  . risedronate (ACTONEL) 150 MG tablet Take 1 tablet by mouth every 30 (thirty) days.      No current facility-administered medications on file prior to visit.

## 2020-03-21 NOTE — Progress Notes (Signed)
Patient ID: Eric [name], male    DOB: 1956-02-12, 65 y.o.   MRN: [id number]  HPI male never smoker followed for chronic obstructive asthma, ended Xolair 08/28/2011, hx RML resection age 89, chronic sinus disease, restless legs, complicated by GERD  CF Sweat Chloride Test  Negative (remote) Office spirometry 04/08/12- severe obstructive airways disease- FVC 2.64/58%, FEV1 1.30/36%, FEV1/FVC 0.49, FEF 25-75% 0.50/14%.   CT chest 04/01/2016 showed emphysema and scarring with chronic bronchitis changes but no ILD and no bronchiectasis --------------------------------------------------------------------------- 07/14/19- 65 year old male never smoker followed for COPD mixed type, ended Xolair 08/28/2011, On [place].  history RML resection age 89, chronic sinus disease, Steroid dependent/ Adrenal Insufficiency, restless legs, complicated by GERD Continues Dupixent. Symbicort 160, Hydrocortisone maint .Neb Atrovent/ albuterol, albuterol hfa,  ------f/u Servere persistent asthma/COPD Started self-injection Dupixent. Has not used nebulizer in a year, but puts up with chronic wheeze.  Breztri not better than Symbicort Steroid dose now is hydrocortisone 15 mg daily maint per Endocrinology.  CXR 03/31/19-  . No acute cardiopulmonary disease.  03/22/20-  65 year old male never smoker followed for COPD mixed type, ended Xolair 08/28/2011, ended [name].  history RML resection age 26, chronic sinus disease, Steroid dependent/ Adrenal Insufficiency, restless legs, complicated by GERD ended Dupixent.  Symbicort 160, Hydrocortisone maint .Neb Atrovent/ albuterol, albuterol hfa, Nucala, We sent prednisone taper and doxycycline for exacerbation 11/18. Covid vax- 3 Phizer Flu vax-had Pneumonia vax-                  //Due for update/// Usng nebulizer 1x/ week, rescue hfa 3x/ day. Continue hydrocortisone 15 mg daily, working with Endocrinology for adrenal replacement. Noting more wheeze and rattle.Nucala is not  helping. Laural Benes helped a lot initially, but not on retrial. Dupixent maybe stabilized some, but not much. We discussed Tezpire.   Review of Systems-See HPI   + = positive Constitutional:   No weight loss, night sweats,  Fevers, chills, fatigue, lassitude. HEENT:   No headaches,  Difficulty swallowing,  Tooth/dental problems,  Sore throat,                No sneezing, itching, ear ache, +-nasal congestion, post nasal drip,  CV:   chest pain, orthopnea, PND, swelling in lower extremities, anasarca, dizziness, palpitations GI    Heartburn, indigestion, no-abdominal pain, nausea, vomiting, Resp:    +-productive cough,  + non-productive cough,  No coughing up of blood.  No change in color of mucus.                                       wheezing. +Short of breath mainly w/ exertion.  Skin: no rash or lesions. GU:  MS:  No joint pain or swelling. Marland Kitchen Psych:  No change in mood or affect. No depression or anxiety.  No memory loss.  Objective:   Physical Exam General- Alert, Oriented, Affect-appropriate, Distress- none acute,  Looks well-Not cushingoid  Skin- +echymoses on arms- chronic steroids  Lymphadenopathy- none Head- atraumatic            Eyes- Gross vision intact, PERRLA, conjunctivae- clear secretions            Ears- Hearing, canals normal            Nose- +  stuffy, no-Septal dev,  polyps, erosion, perforation             Throat- Mallampati II , mucosa  clear , drainage- none, tonsils- atrophic.  Neck- flexible , trachea midline, no stridor , thyroid nl, carotid no bruit Chest - symmetrical excursion , unlabored           Heart/CV- RRR , no murmur , no gallop  , no rub, nl s1 s2                           - JVD- none , edema- none, stasis changes- none, varices- none           Lung-    Wheeze+ bilateral/ unlabored , able to speak full sentences. Cough - none,                                          dullness-none, rub- none.            Chest wall-  Abd- Br/ Gen/ Rectal- Not done, not  indicated Extrem- cyanosis- none, clubbing, none, atrophy- none, strength- nl Neuro- grossly intact to observation

## 2020-03-22 ENCOUNTER — Ambulatory Visit (INDEPENDENT_AMBULATORY_CARE_PROVIDER_SITE_OTHER): Payer: Managed Care, Other (non HMO) | Admitting: Internal Medicine

## 2020-03-22 ENCOUNTER — Encounter: Payer: Self-pay | Admitting: Internal Medicine

## 2020-03-22 ENCOUNTER — Other Ambulatory Visit: Payer: Self-pay

## 2020-03-22 DIAGNOSIS — E279 Disorder of adrenal gland, unspecified: Secondary | ICD-10-CM | POA: Diagnosis not present

## 2020-03-22 DIAGNOSIS — J330 Polyp of nasal cavity: Secondary | ICD-10-CM | POA: Diagnosis not present

## 2020-03-22 DIAGNOSIS — J449 Chronic obstructive pulmonary disease, unspecified: Secondary | ICD-10-CM | POA: Diagnosis not present

## 2020-03-22 MED ORDER — DOXYCYCLINE HYCLATE 100 MG PO TABS
ORAL_TABLET | ORAL | 0 refills | Status: DC
Start: 2020-03-22 — End: 2021-05-23

## 2020-03-22 MED ORDER — PREDNISONE 10 MG PO TABS
ORAL_TABLET | ORAL | 0 refills | Status: DC
Start: 2020-03-22 — End: 2021-05-23

## 2020-03-22 MED ORDER — BREZTRI AEROSPHERE 160-9-4.8 MCG/ACT IN AERO: 2.0000 | INHALATION_SPRAY | Freq: Two times a day (BID) | RESPIRATORY_TRACT | 0 refills | Status: DC

## 2020-03-22 NOTE — Patient Instructions (Signed)
Sample x 2 Breztri   Inhale 2 puffs then rinse mouth, twice daily- try this instead of Symbicort  You can go ahead and look up Tezspire- the next Biologic.  You can decide to continue Nucala until [name] is avaialble, or if it looks as if it will be some months, then we can try going back to [place] or [place], which seemed to work better than Nucala.  Please call if we can help

## 2020-04-09 ENCOUNTER — Telehealth: Payer: Self-pay | Admitting: Internal Medicine

## 2020-04-09 NOTE — Telephone Encounter (Signed)
Returned call and advised we can start the process. Patient will stop by office to sign new start paperwork. Will place MD poriton in his box.

## 2020-04-09 NOTE — Telephone Encounter (Signed)
Came by and signed Tezspire enrollment form. Awaiting MD portion.

## 2020-04-10 ENCOUNTER — Other Ambulatory Visit: Payer: Self-pay

## 2020-04-10 NOTE — Progress Notes (Signed)
Fax from [place] that Ventolin HFA is not covered by patient's insurance. Called [phone] and spoke with pharmacist and gave verbal order to fill RX for Proventil HFA 90 MCG as that was a covered alternative listed for patient. Nothing further needed at this time.

## 2020-04-23 NOTE — Telephone Encounter (Signed)
Tezspire [phone] [employer]- [phone]

## 2020-04-23 NOTE — Telephone Encounter (Signed)
Faxed Tezspire New start forms to [employer]. Will update once we receive a response.

## 2020-04-27 NOTE — Telephone Encounter (Signed)
Patient was enrolled into the Tezspire's quick start program. Patient's [name] was arrived to the office on 04/27/20. Was place in med fridge.

## 2020-05-02 NOTE — Telephone Encounter (Signed)
Returned patient's call but unable to reach. Left VM requesting call back.  Patient will need to be scheduled on Lisa's schedule since it is clinic-administered. He will need to wait 2-hours post administration for monitoring and have Epi-pen that is in-date and on hand.  Therefore, he will need to be scheduled at a time that allows adequate post-admin monitoring time by [facility].  Will f/u if patient does not return call  Knox Saliva, PharmD, MPH Clinical Pharmacist (Rheumatology and Pulmonology)

## 2020-05-02 NOTE — Telephone Encounter (Signed)
Pt calling to check on the start of his new injection. Pt can be reached at [phone].

## 2020-05-02 NOTE — Telephone Encounter (Signed)
Could someone please reach out to patient about his transition to [place]?

## 2020-05-03 ENCOUNTER — Telehealth: Payer: Self-pay | Admitting: Internal Medicine

## 2020-05-03 ENCOUNTER — Other Ambulatory Visit: Payer: Self-pay | Admitting: Pharmacist

## 2020-05-03 ENCOUNTER — Encounter: Payer: Self-pay | Admitting: Internal Medicine

## 2020-05-03 DIAGNOSIS — J4550 Severe persistent asthma, uncomplicated: Secondary | ICD-10-CM

## 2020-05-03 MED ORDER — EPINEPHRINE 0.3 MG/0.3ML IJ SOAJ: 0.3000 mg | INTRAMUSCULAR | 2 refills | Status: AC | PRN

## 2020-05-03 NOTE — Assessment & Plan Note (Addendum)
Continues maintenance hydrocortisone supervised by Endocrinology

## 2020-05-03 NOTE — Telephone Encounter (Signed)
Spoke with Patient. Patient rescheduled for Tezspire injection 05/15/20 at 1330.

## 2020-05-03 NOTE — Telephone Encounter (Signed)
New EpiPen rx sent to patient-preferred [facility].  Knox Saliva, PharmD, MPH Clinical Pharmacist (Rheumatology and Pulmonology)

## 2020-05-03 NOTE — Telephone Encounter (Signed)
Patient reports that Nucala was due last week but has not taken. Patient's EpiPen at home is expired. New prescription sent to [facility]. Discussed that he must have Epipen on hand at first [place] and subsequent doses.  Reviewed office policy (2 hour administration time) and Epipen requirement.  [name] will be shipped from [employer] program until we can run a prior authorization. [name] has been delivered to our clinic. Discussed with patient that he will be responsible for administration fee since there is no way to run authorization right now - discussed dosing of every 4 weeks and that it is currently only able to be administered by healthcare provider in healthcare setting.  Patient verbalized understanding.  I also discussed current plan to transition all pulmonary injectable medications to infusion center and subsequent injections after first dose will be owned by that team. He verbalized understanding.  Will route to Salome Arnt, LPN  Patient scheduled for new start on 05/11/20 @ 1:30pm.    Knox Saliva, PharmD, MPH Clinical Pharmacist (Rheumatology and Pulmonology)

## 2020-05-03 NOTE — Assessment & Plan Note (Signed)
Not seeing recurrence currently

## 2020-05-03 NOTE — Assessment & Plan Note (Signed)
Significant chronic wheeze. We discussed options. Current generation Biologics have not made a sustained difference. We discussed and he is interested in trying [place]. Plan- Change Nucala to [place]. Doxy and prednisone to hold.  He is going to stretch out Nucala, trying to tell how much difference it makes.

## 2020-05-03 NOTE — Telephone Encounter (Signed)
Noted. Will start Tezpire injection with Patient 05/11/20.

## 2020-05-03 NOTE — Telephone Encounter (Signed)
Returned patient's call. New Epipen rx sent to [facility]. Patient scheduled for Tezspire new start 3/4 @ 1:30p

## 2020-05-07 ENCOUNTER — Telehealth: Payer: Self-pay | Admitting: Internal Medicine

## 2020-05-07 NOTE — Telephone Encounter (Signed)
Called and spoke with Patient. Patient scheduled first Tezspire injection 05/11/20.  Patient is bringing Epipen and aware of 2 hours observation.

## 2020-05-11 ENCOUNTER — Ambulatory Visit (INDEPENDENT_AMBULATORY_CARE_PROVIDER_SITE_OTHER): Payer: Managed Care, Other (non HMO)

## 2020-05-11 ENCOUNTER — Other Ambulatory Visit: Payer: Self-pay

## 2020-05-11 ENCOUNTER — Ambulatory Visit: Payer: Managed Care, Other (non HMO)

## 2020-05-11 DIAGNOSIS — J449 Chronic obstructive pulmonary disease, unspecified: Secondary | ICD-10-CM

## 2020-05-11 NOTE — Progress Notes (Signed)
Patient presented to the office today for first-time Tezspire injection.  Tezspire 210mg /1.27ml prefilled syringe Lot # [id number] NDC# 43606-770-34 Exp date 11/08/2022  Primary Pulmonologist: [name] MD Medication name: [name] Strength: 210mg /1.53ml Site(s): left upper arm  Epi pen/Auvi-Q visible during appointment: Yes  Time of injection: 1330  Patient evaluated every 15 minutes per protocol x2 hours.  1st check: 1345 Evaluation: Patient denies any side or adverse effects. No redness or swelling at injection site.  2nd check: 1400  Evaluation: Patient sitting in chair. Denies problems. No redness or swelling at injection site.  3rd check: 1415  Evaluation: Patient sitting in chair. Patient denies problems. No redness or swelling at injection site.  4th check: 1430   Evaluation:Patient sitting in chair. Patient denies any side or adverse effects. No redness or swelling at injection site.  5th check: 1500  Evaluation: Patient sitting quietly in chair. Patient denies any problems. No redness or swelling at injection site.  6th check: 1515  Evaluation:Patient sitting quietly in chair. Patient denies any problems. No redness or swelling at injection site.  7th check: 1530  Evaluation:Patient denies any side or adverse effects. No redness or swelling at injection site.    Reviewed possible side effects on Tezspire injection. Patient aware and demonstrated how to administer epipen with demo pen. Patient understands what to do in emergency adverse effect and when to seek emergency care. Patient scheduled 2nd Tezspire injection 06/08/20 at 1030.

## 2020-05-11 NOTE — Telephone Encounter (Signed)
Received first injection in clinic on 05/11/20 (administered and monitored by Salome Arnt, LPN)  Second dose will be administered at [facility] at 06/08/20 by Salome Arnt with 30 min monitoring time.  Subsequent doses will be managed by infusion center on [address].

## 2020-05-15 ENCOUNTER — Ambulatory Visit: Payer: Managed Care, Other (non HMO)

## 2020-05-18 ENCOUNTER — Telehealth: Payer: Managed Care, Other (non HMO) | Admitting: Internal Medicine

## 2020-05-18 DIAGNOSIS — J4489 Other specified chronic obstructive pulmonary disease: Secondary | ICD-10-CM

## 2020-05-18 DIAGNOSIS — J449 Chronic obstructive pulmonary disease, unspecified: Secondary | ICD-10-CM

## 2020-05-21 ENCOUNTER — Telehealth: Payer: Self-pay | Admitting: Internal Medicine

## 2020-05-22 DIAGNOSIS — Z0289 Encounter for other administrative examinations: Secondary | ICD-10-CM

## 2020-05-22 NOTE — Telephone Encounter (Signed)
Rec'd forms back from [facility] - sent email to Kathlee Nations to drop form charge -pr

## 2020-05-23 NOTE — Telephone Encounter (Signed)
Charge dropped, fee collected. Faxed forms to [facility]. At [phone] - will mail copy and receipt to patient -pr

## 2020-05-24 NOTE — Telephone Encounter (Signed)
Called and left message for Eric [name] from [place] advised that they have Tezspire listed as a self-injectible and it must be processed through the pharmacy benefit. We did submit PA through [facility] pharmacy benefit, but now we have the question about administration fees because medication currently must be injected by a healthcare professional.  Rep confirmed patient's next dose is scheduled to deliver 05/31/20 to the clinic.  Will follow up.

## 2020-05-24 NOTE — Telephone Encounter (Signed)
Submitted a Prior Authorization request to [employer] (pharmacy benefit) for [place] via Cover My Meds. Will update once we receive a response.  Called MEDICAL BENEFIT and stated that medication is not allowed/covered under the medical benefit. Will follow up with TEZSPIRE for clarification. [phone]   [id number] - PA Case ID: [id number]

## 2020-05-24 NOTE — Telephone Encounter (Signed)
Called Eric [name] and was able to submit a medical prior authorization for J3590- Tezspire. Awaiting response.  Site of [facility] [place].  Auth# [id number]  Phone# [phone]   Spoke to Altha Harm at [place], she says if patient's plan mandates it to go through pharmacy benefit. Tezspire's copay card pays up to $1000 in administration fees per calendar year.

## 2020-05-28 NOTE — Telephone Encounter (Signed)
Received notification from [facility] regarding a prior authorization for [place]. Authorization has been APPROVED from 05/24/20 to 05/24/21.    PA Case ID: [id number] Authorization # KEY [id number]  Phone # [phone]

## 2020-05-28 NOTE — Telephone Encounter (Signed)
Ran test claim through Pharmacy benefit, Patient's copay would be $100 for 1 month supply. Patient does qualify to enrol in [place] copay card. Patient will have to fill through [facility].  Medical benefit PA is still pending.- Awaiting response for buy & bill.

## 2020-06-08 ENCOUNTER — Other Ambulatory Visit: Payer: Self-pay

## 2020-06-08 ENCOUNTER — Ambulatory Visit: Payer: Managed Care, Other (non HMO) | Admitting: Internal Medicine

## 2020-06-08 ENCOUNTER — Ambulatory Visit: Payer: Managed Care, Other (non HMO)

## 2020-06-08 DIAGNOSIS — J4489 Other specified chronic obstructive pulmonary disease: Secondary | ICD-10-CM

## 2020-06-08 DIAGNOSIS — J449 Chronic obstructive pulmonary disease, unspecified: Secondary | ICD-10-CM

## 2020-06-08 NOTE — Progress Notes (Signed)
Patient presented to the office today for second  Tezspire  injection.  Primary Pulmonologist: [name] MD Medication name: [name] Strength: 210mg /1.62ml Site(s): right arm  Epi pen/Auvi-Q visible during appointment: Yes  Time of injection: 1040  Patient monitored for 30 minutes. Patient denies any side or adverse effects. No redness or swelling at injection site.  Patient stated he has noticed improvement in asthma symptoms since Tezspire start. Patient scheduled 07/09/20 for third Tezspire injection.  Tezspire 210mg /1.81ml  NDC 00511-021-11 Lot # [id number] Expiration date- 11/08/2022 Administered in right arm

## 2020-06-11 NOTE — Telephone Encounter (Signed)
Done

## 2020-06-11 NOTE — Telephone Encounter (Signed)
Order- please schedule PFT.   This needs to be at least 1 month after latest Tezpire dose and he should be off inhalers and on lowest tolerated steroid dose on the day of PFT        dx Asthma severe persistent uncomplicated

## 2020-06-11 NOTE — Telephone Encounter (Signed)
Patient advised that medical benefit requires: 1) asthma with reversibility of at least 12% and 237mL in FEV1 after albuterol/levalbuterol 2) asthma diagnosis confirmed by pre-bronchodilator FEV1 < 80%  After discussion with patient and Dr. Annamaria Boots, we will get repeat PFT per insurance request.  PFT from 2014 did not have a bronchodilator pre-post. Will route to Dr. Annamaria Boots to be ordered and triage to have patient scheduled for PFT thereafter.  Knox Saliva, PharmD, MPH Clinical Pharmacist (Rheumatology and Pulmonology)

## 2020-06-11 NOTE — Telephone Encounter (Signed)
Order placed. Nothing further needed at this time.    Per Dr. Annamaria Boots  Order- please schedule PFT.   This needs to be at least 1 month after latest Tezpire dose and he should be off inhalers and on lowest tolerated steroid dose on the day of PFT        dx Asthma severe persistent uncomplicated

## 2020-06-14 ENCOUNTER — Telehealth: Payer: Self-pay | Admitting: Internal Medicine

## 2020-06-14 MED ORDER — TEZEPELUMAB-EKKO 210 MG/1.91ML ~~LOC~~ SOSY
210.0000 mg | PREFILLED_SYRINGE | Freq: Once | SUBCUTANEOUS | Status: AC
  Administered 2020-05-11: 210 mg via SUBCUTANEOUS

## 2020-06-14 MED ORDER — TEZEPELUMAB-EKKO 210 MG/1.91ML ~~LOC~~ SOSY
210.0000 mg | PREFILLED_SYRINGE | Freq: Once | SUBCUTANEOUS | Status: AC
  Administered 2020-06-08: 210 mg via SUBCUTANEOUS

## 2020-06-14 NOTE — Telephone Encounter (Signed)
Called and spoke with Patient. Patient stated he had spoke with pharmacy team and was told he would need a PFT for insurance approval with Tezspire. Patient stated Pharmacy team advised they had discussed with Dr. Annamaria Boots and PFT order was placed.  Patient stated he has upcoming OV with Dr. Annamaria Boots 06/19/20 and wanted to see if he could get PFT scheduled the same day as OV. Patient does have a PFT ordered by Dr. Annamaria Boots. Patient scheduled 06/19/20 at 10 with Dr. Annamaria Boots. PFT scheduled 06/19/20 at 1200. Patient has covid testing scheduled 06/15/20 at 1150. Nothing further at this time.

## 2020-06-14 NOTE — Addendum Note (Signed)
Addended by: Elton Sin on: 06/14/2020 04:43 PM   Modules accepted: Orders

## 2020-06-15 ENCOUNTER — Other Ambulatory Visit (HOSPITAL_COMMUNITY)
Admission: RE | Admit: 2020-06-15 | Discharge: 2020-06-15 | Disposition: A | Discharge status: Home / Self Care | Payer: Managed Care, Other (non HMO) | Source: Ambulatory Visit | Attending: Internal Medicine | Admitting: Internal Medicine

## 2020-06-15 DIAGNOSIS — Z01812 Encounter for preprocedural laboratory examination: Secondary | ICD-10-CM | POA: Insufficient documentation

## 2020-06-15 DIAGNOSIS — Z20822 Contact with and (suspected) exposure to covid-19: Secondary | ICD-10-CM | POA: Diagnosis not present

## 2020-06-16 LAB — SARS CORONAVIRUS 2 (TAT 6-24 HRS): SARS Coronavirus 2: NEGATIVE

## 2020-06-18 NOTE — Progress Notes (Signed)
Patient ID: Eric [name], male    DOB: [birth year], 65 y.o.   MRN: [id number]  HPI male never smoker followed for chronic obstructive asthma, ended Xolair 08/28/2011, hx RML resection age 38, chronic sinus disease, restless legs, complicated by GERD  CF Sweat Chloride Test  Negative (remote) Office spirometry 04/08/12- severe obstructive airways disease- FVC 2.64/58%, FEV1 1.30/36%, FEV1/FVC 0.49, FEF 25-75% 0.50/14%.   CT chest 04/01/2016 showed emphysema and scarring with chronic bronchitis changes but no ILD and no bronchiectasis ---------------------------------------------------------------------------   03/22/20-  65 year old male never smoker followed for COPD mixed type, ended Xolair 08/28/2011, ended [name].  history RML resection age 29, chronic sinus disease, Steroid dependent/ Adrenal Insufficiency, restless legs, complicated by GERD ended Dupixent.  Symbicort 160, Hydrocortisone maint .Neb Atrovent/ albuterol, albuterol hfa, Nucala, We sent prednisone taper and doxycycline for exacerbation 11/18. Covid vax- 3 Phizer Flu vax-had Pneumonia vax-                  //Due for update/// Usng nebulizer 1x/ week, rescue hfa 3x/ day. Continue hydrocortisone 15 mg daily, working with Endocrinology for adrenal replacement. Noting more wheeze and rattle.Nucala is not helping. Laural Benes helped a lot initially, but not on retrial. Dupixent maybe stabilized some, but not much. We discussed Tezpire.   06/19/20- 65 year old male never smoker followed for COPD mixed type, ended Xolair 08/28/2011, ended [name].  history RML resection age 38, chronic sinus disease, Steroid dependent/ Adrenal Insufficiency, restless legs, complicated by GERD ended Dupixent.  -Symbicort 160, Hydrocortisone maint .Neb Atrovent/ albuterol, albuterol hfa,   Hydrocortisone 15 mg daily,  Tezpire inj on 06/09/20 Covid vax- 3 Phizer Flu vax-had Breztri no better than Symbicort. Now stabilized on hydrocortisone 20 mg daily. Since  starting Tezpire ( second dose today), he has been able to reduce cortisone from 20 mg and stop using nebulizer machine. Early, but he thinks [name] is making a real difference. His baseline is severe obstruction, but there iss a definite reacctive component currently obscured by Symbicort and hydrocortisone.  For PFT later today.    PFT4/4/22- pending later today  Review of Systems-See HPI   + = positive Constitutional:   No weight loss, night sweats,  Fevers, chills, fatigue, lassitude. HEENT:   No headaches,  Difficulty swallowing,  Tooth/dental problems,  Sore throat,                No sneezing, itching, ear ache, +-nasal congestion, post nasal drip,  CV:   chest pain, orthopnea, PND, swelling in lower extremities, anasarca, dizziness, palpitations GI    Heartburn, indigestion, no-abdominal pain, nausea, vomiting, Resp:    +-productive cough,  + non-productive cough,  No coughing up of blood.  No change in color of mucus.                                       wheezing. +Short of breath mainly w/ exertion.  Skin: no rash or lesions. GU:  MS:  No joint pain or swelling. Marland Kitchen Psych:  No change in mood or affect. No depression or anxiety.  No memory loss.  Objective:   Physical Exam General- Alert, Oriented, Affect-appropriate, Distress- none acute,  Looks well-Not cushingoid  Skin- +echymoses on arms- chronic steroids  Lymphadenopathy- none Head- atraumatic            Eyes- Gross vision intact, PERRLA, conjunctivae- clear secretions  Ears- Hearing, canals normal            Nose- +  stuffy, no-Septal dev,  polyps, erosion, perforation             Throat- Mallampati II , mucosa clear , drainage- none, tonsils- atrophic.  Neck- flexible , trachea midline, no stridor , thyroid nl, carotid no bruit Chest - symmetrical excursion , unlabored           Heart/CV- RRR , no murmur , no gallop  , no rub, nl s1 s2                           - JVD- none , edema- none, stasis changes- none,  varices- none           Lung-    Wheeze+ bilateral/ unlabored , able to speak full sentences. Cough - none,                                          dullness-none, rub- none.            Chest wall-  Abd- Br/ Gen/ Rectal- Not done, not indicated Extrem- cyanosis- none, clubbing, none, atrophy- none, strength- nl Neuro- grossly intact to observation

## 2020-06-19 ENCOUNTER — Encounter: Payer: Managed Care, Other (non HMO) | Admitting: Internal Medicine

## 2020-06-19 ENCOUNTER — Ambulatory Visit (INDEPENDENT_AMBULATORY_CARE_PROVIDER_SITE_OTHER): Payer: Managed Care, Other (non HMO) | Admitting: Internal Medicine

## 2020-06-19 ENCOUNTER — Encounter: Payer: Self-pay | Admitting: Internal Medicine

## 2020-06-19 ENCOUNTER — Other Ambulatory Visit: Payer: Self-pay

## 2020-06-19 DIAGNOSIS — E279 Disorder of adrenal gland, unspecified: Secondary | ICD-10-CM

## 2020-06-19 DIAGNOSIS — J449 Chronic obstructive pulmonary disease, unspecified: Secondary | ICD-10-CM

## 2020-06-19 LAB — PULMONARY FUNCTION TEST
DL/VA % pred: 124 %
DL/VA: 5.26 ml/min/mmHg/L
DLCO cor % pred: 94 %
DLCO cor: 24.11 ml/min/mmHg
DLCO unc % pred: 94 %
DLCO unc: 24.11 ml/min/mmHg
FEF 25-75 Post: 0.71 L/sec
FEF 25-75 Pre: 0.61 L/sec
FEF2575-%Change-Post: 16 %
FEF2575-%Pred-Post: 26 %
FEF2575-%Pred-Pre: 22 %
FEV1-%Change-Post: 6 %
FEV1-%Pred-Post: 44 %
FEV1-%Pred-Pre: 41 %
FEV1-Post: 1.47 L
FEV1-Pre: 1.37 L
FEV1FVC-%Change-Post: 1 %
FEV1FVC-%Pred-Pre: 65 %
FEV6-%Change-Post: 4 %
FEV6-%Pred-Post: 68 %
FEV6-%Pred-Pre: 65 %
FEV6-Post: 2.85 L
FEV6-Pre: 2.73 L
FEV6FVC-%Change-Post: 0 %
FEV6FVC-%Pred-Post: 102 %
FEV6FVC-%Pred-Pre: 102 %
FVC-%Change-Post: 5 %
FVC-%Pred-Post: 67 %
FVC-%Pred-Pre: 63 %
FVC-Post: 2.94 L
FVC-Pre: 2.79 L
Post FEV1/FVC ratio: 50 %
Post FEV6/FVC ratio: 97 %
Pre FEV1/FVC ratio: 49 %
Pre FEV6/FVC Ratio: 98 %
RV % pred: 149 %
RV: 3.26 L
TLC % pred: 97 %
TLC: 6.44 L

## 2020-06-19 MED ORDER — ALPRAZOLAM 0.5 MG PO TABS: 0.5000 mg | ORAL_TABLET | Freq: Two times a day (BID) | ORAL | 1 refills | Status: DC | PRN

## 2020-06-19 NOTE — Patient Instructions (Signed)
I'm really glad Eric [name] is clearly helping, where Nucala did not  We will review the PFT results today   Please call if we can help

## 2020-06-19 NOTE — Assessment & Plan Note (Signed)
Managed by [name]/ Endocrinology on hydrocortisone

## 2020-06-19 NOTE — Assessment & Plan Note (Signed)
After failing Xolair, Fasenra, Dupixent and Nucala, he is definitely improving now on Tezpire, even though peak pollen season which is usually significant trigger for him.  Plan- continue [employer]

## 2020-06-21 NOTE — Telephone Encounter (Signed)
Please advise if triage needs to schedule this PFT or if pharmacy will do this. Thanks.

## 2020-06-21 NOTE — Telephone Encounter (Signed)
PFT completed 06/19/20. Tezspire appeal still in process so pharmacy will keep encounter open for f/u

## 2020-06-22 ENCOUNTER — Other Ambulatory Visit: Payer: Self-pay | Admitting: Pharmacist

## 2020-06-22 NOTE — Progress Notes (Signed)
Patient transitioning to infusion center for Tezspire administration (unable to be self-administered)  First Tezspire dose received in pulm clinic Eric Arnt, LPN, administered) on 05/11/20 with 2 hour monitoring period. Second Tezspire dose received in pulm clinic on 06/08/20 with 30 minute monitoring period.  Third Tezspire dose will be at [place] and is due 07/06/20 (currenlty scheduled for 07/09/20 with pulmonary care). Will require 30 minute monitoring period. Therapy plan placed.  Coverage: currently receiving through quick start program and appeal through medical benefit was submitted today.  Eric [name], PharmD, MPH Clinical Pharmacist (Rheumatology and Pulmonology)

## 2020-06-22 NOTE — Telephone Encounter (Signed)
Faxed expedited appeal for Tezspire to [employer]  Phone: [phone] Fax: [phone] Ref #: [id number]

## 2020-06-25 NOTE — Progress Notes (Signed)
Together with [name] said shipment can be scheduled a week prior to next injection, they will also inquire about appeal status.  Phone# [phone]

## 2020-06-26 NOTE — Telephone Encounter (Signed)
Josem Kaufmann was incorrectly entered as [facility] by [facility]. Per rep, re-submitted with letter stating correction to site of care as [place]  NPI: [id number] Tax ID: [id number]  Knox Saliva, PharmD, MPH Clinical Pharmacist (Rheumatology and Pulmonology)

## 2020-07-02 NOTE — Telephone Encounter (Addendum)
Refaxed [name] appeal forms including letter with correct site of care information to fax provided by Sal: [phone]  Will f/u

## 2020-07-09 ENCOUNTER — Other Ambulatory Visit: Payer: Self-pay

## 2020-07-09 ENCOUNTER — Ambulatory Visit (INDEPENDENT_AMBULATORY_CARE_PROVIDER_SITE_OTHER): Payer: Managed Care, Other (non HMO)

## 2020-07-09 ENCOUNTER — Ambulatory Visit: Payer: Managed Care, Other (non HMO) | Admitting: Internal Medicine

## 2020-07-09 VITALS — BP 128/81 | HR 75 | Temp 98.2°F | Resp 18

## 2020-07-09 DIAGNOSIS — J449 Chronic obstructive pulmonary disease, unspecified: Secondary | ICD-10-CM | POA: Diagnosis not present

## 2020-07-09 MED ORDER — ALBUTEROL SULFATE HFA 108 (90 BASE) MCG/ACT IN AERS: 2.0000 | INHALATION_SPRAY | Freq: Once | RESPIRATORY_TRACT | Status: DC | PRN

## 2020-07-09 MED ORDER — METHYLPREDNISOLONE SODIUM SUCC 125 MG IJ SOLR: 125.0000 mg | Freq: Once | INTRAMUSCULAR | Status: DC | PRN

## 2020-07-09 MED ORDER — TEZEPELUMAB-EKKO 210 MG/1.91ML ~~LOC~~ SOSY
210.0000 mg | PREFILLED_SYRINGE | Freq: Once | SUBCUTANEOUS | Status: AC
  Administered 2020-07-09: 210 mg via SUBCUTANEOUS
  Filled 2020-07-09: qty 1.91

## 2020-07-09 MED ORDER — EPINEPHRINE 0.3 MG/0.3ML IJ SOAJ: 0.3000 mg | Freq: Once | INTRAMUSCULAR | Status: DC | PRN

## 2020-07-09 MED ORDER — DIPHENHYDRAMINE HCL 50 MG/ML IJ SOLN: 50.0000 mg | Freq: Once | INTRAMUSCULAR | Status: DC | PRN

## 2020-07-09 MED ORDER — FAMOTIDINE IN NACL 20-0.9 MG/50ML-% IV SOLN: 20.0000 mg | Freq: Once | INTRAVENOUS | Status: DC | PRN

## 2020-07-09 MED ORDER — SODIUM CHLORIDE 0.9 % IV SOLN: Freq: Once | INTRAVENOUS | Status: DC | PRN

## 2020-07-09 NOTE — Progress Notes (Signed)
Diagnosis: Asthma  Provider:  Marshell Garfinkel, MD  Procedure: Injection  Tezspire, Dose: 210mg , Site: subcutaneous Post injection observed 30 minutes without any symptoms. Discharge: Condition: Good, Destination: Home . AVS provided to patient.   Performed by:  Arnoldo Morale, RN

## 2020-07-12 ENCOUNTER — Telehealth: Payer: Self-pay | Admitting: Internal Medicine

## 2020-07-13 NOTE — Telephone Encounter (Signed)
Called and advised that appeal is in process and we are awaiting response. Plan would not allow request to be expedited.  Next Tezspire shipment is scheduled to deliver to clinic on 07/17/20

## 2020-07-23 NOTE — Telephone Encounter (Signed)
Devki, please advise if you have an update on this.

## 2020-07-24 NOTE — Telephone Encounter (Addendum)
Called [employer] to check on status of appeal, rep Clarise Cruz advised that appeal was marked as duplicate but she can not see the duplicate that the reviewer was referencing to. She will submit for Urgent review and we should expect decision in 3 business days.  Auth# [id number] Ref# [id number]  Phone# [phone]  Patient is currently receiving medication from the manufacturer while this appeal is in process.

## 2020-08-01 NOTE — Telephone Encounter (Signed)
Called [employer] to check status of Tezspire appeal, per rep the expedited appeal is still pending. She will reach out to team to check status and will call office back with update.  Ref# [id number] Phone# [phone]

## 2020-08-07 ENCOUNTER — Ambulatory Visit (INDEPENDENT_AMBULATORY_CARE_PROVIDER_SITE_OTHER): Payer: Managed Care, Other (non HMO)

## 2020-08-07 ENCOUNTER — Other Ambulatory Visit: Payer: Self-pay | Admitting: Internal Medicine

## 2020-08-07 ENCOUNTER — Other Ambulatory Visit: Payer: Self-pay

## 2020-08-07 VITALS — BP 124/82 | HR 70 | Temp 99.0°F | Resp 18

## 2020-08-07 DIAGNOSIS — J449 Chronic obstructive pulmonary disease, unspecified: Secondary | ICD-10-CM | POA: Diagnosis not present

## 2020-08-07 MED ORDER — DIPHENHYDRAMINE HCL 50 MG/ML IJ SOLN: 50.0000 mg | Freq: Once | INTRAMUSCULAR | Status: DC | PRN

## 2020-08-07 MED ORDER — ALBUTEROL SULFATE HFA 108 (90 BASE) MCG/ACT IN AERS: 2.0000 | INHALATION_SPRAY | Freq: Once | RESPIRATORY_TRACT | Status: DC | PRN

## 2020-08-07 MED ORDER — EPINEPHRINE 0.3 MG/0.3ML IJ SOAJ: 0.3000 mg | Freq: Once | INTRAMUSCULAR | Status: DC | PRN

## 2020-08-07 MED ORDER — FAMOTIDINE IN NACL 20-0.9 MG/50ML-% IV SOLN: 20.0000 mg | Freq: Once | INTRAVENOUS | Status: DC | PRN

## 2020-08-07 MED ORDER — TEZEPELUMAB-EKKO 210 MG/1.91ML ~~LOC~~ SOSY
210.0000 mg | PREFILLED_SYRINGE | Freq: Once | SUBCUTANEOUS | Status: AC
  Administered 2020-08-07: 210 mg via SUBCUTANEOUS
  Filled 2020-08-07: qty 1.91

## 2020-08-07 MED ORDER — METHYLPREDNISOLONE SODIUM SUCC 125 MG IJ SOLR: 125.0000 mg | Freq: Once | INTRAMUSCULAR | Status: DC | PRN

## 2020-08-07 MED ORDER — SODIUM CHLORIDE 0.9 % IV SOLN: Freq: Once | INTRAVENOUS | Status: DC | PRN

## 2020-08-07 NOTE — Progress Notes (Signed)
Diagnosis: Asthma  Provider:  Marshell Garfinkel, MD  Procedure: Injection  Tezspire, Dose: 210, Site: subcutaneous  Discharge: Condition: Good, Destination: Home . AVS provided to patient.   Performed by:  Koren Shiver, RN

## 2020-08-08 NOTE — Telephone Encounter (Addendum)
Called [employer] to get update on Tezspire appeal. Per claims department, appeal is in process with a set determination date of 08/23/20.   Ref# [id number]  Phone# [phone]

## 2020-08-14 ENCOUNTER — Telehealth: Payer: Self-pay | Admitting: Internal Medicine

## 2020-08-14 MED ORDER — ALBUTEROL SULFATE HFA 108 (90 BASE) MCG/ACT IN AERS: 2.0000 | INHALATION_SPRAY | Freq: Four times a day (QID) | RESPIRATORY_TRACT | 3 refills | Status: DC | PRN

## 2020-08-14 NOTE — Telephone Encounter (Signed)
I called and spoke with patient regarding Ventolin script. Patient was last seen in office on 06/09/20. I sent in script for 90 day with preferred instructions to preferred pharmacy. Patient verbalized understanding, nothing further needed.

## 2020-08-17 ENCOUNTER — Other Ambulatory Visit: Payer: Self-pay | Admitting: *Deleted

## 2020-08-17 MED ORDER — ALBUTEROL SULFATE HFA 108 (90 BASE) MCG/ACT IN AERS: 2.0000 | INHALATION_SPRAY | Freq: Four times a day (QID) | RESPIRATORY_TRACT | 3 refills | Status: DC | PRN

## 2020-08-20 ENCOUNTER — Telehealth: Payer: Self-pay

## 2020-08-23 NOTE — Telephone Encounter (Addendum)
Reached out to [facility] for f/u. Supposedly the new due date for determination is now 08/25/20. Asked rep how an expedited appeal could take longer than 60 calendar days and was informed that the current appeal was never escalated to expedited status. The original expedited appeal was the one marked duplicate and was closed as erroneous. Requested that the rep escalate our current appeal. Request "Claims Status" when following up.  Phone# [phone] NPI: [id number] Tax ID: [id number] Member ID: [id number] Claim# [id number]  Ref# [id number]

## 2020-08-27 ENCOUNTER — Encounter: Payer: Self-pay | Admitting: Internal Medicine

## 2020-08-27 ENCOUNTER — Other Ambulatory Visit (HOSPITAL_COMMUNITY): Payer: Self-pay

## 2020-08-27 NOTE — Telephone Encounter (Deleted)
Per rep at [facility], authorization for TEZSPIRE has been APPROVED from 05/24/2020 until 05/24/2021.  Authorization# [id number]

## 2020-08-28 ENCOUNTER — Other Ambulatory Visit (HOSPITAL_COMMUNITY): Payer: Self-pay

## 2020-08-28 NOTE — Telephone Encounter (Signed)
Reached out to [employer], to advise on best decision moving forward. She will reach out to [place], Geophysicist/field seismologist, since we not sure if we need to re-start authorization and await appeal.  Knox Saliva, PharmD, MPH Clinical Pharmacist (Rheumatology and Pulmonology)

## 2020-08-29 ENCOUNTER — Telehealth: Payer: Self-pay | Admitting: Internal Medicine

## 2020-08-29 NOTE — Telephone Encounter (Signed)
Pt is calling in regards to albuterol (PROVENTIL) ; states that he does not like this one at all and would like to go back to albuterol (VENTOLIN HFA). Pls regard; [phone].  PA # from [employer]; [phone]

## 2020-08-29 NOTE — Telephone Encounter (Signed)
Called and spoke with Patient.  Patient stated [facility] stated PA was needed for Ventolin and a request was faxed to Dr. Annamaria Boots. PA was not in Dr. Janee Morn box or in PA box. PA started on CMM, but more information was requested .  Called [facility] to initiate Ventolin PA, spoke with Dominica.   Angela Nevin stated additional information needed was answered and if anything further needed for PA decision, fax will be sent to [facility]. PA discussion will be received via fax. PA# [id number]  [facility] #- [phone]

## 2020-09-03 ENCOUNTER — Ambulatory Visit (INDEPENDENT_AMBULATORY_CARE_PROVIDER_SITE_OTHER): Payer: Managed Care, Other (non HMO) | Admitting: *Deleted

## 2020-09-03 ENCOUNTER — Other Ambulatory Visit: Payer: Self-pay

## 2020-09-03 VITALS — BP 129/72 | HR 70 | Temp 98.7°F | Resp 18

## 2020-09-03 DIAGNOSIS — J449 Chronic obstructive pulmonary disease, unspecified: Secondary | ICD-10-CM | POA: Diagnosis not present

## 2020-09-03 MED ORDER — METHYLPREDNISOLONE SODIUM SUCC 125 MG IJ SOLR: 125.0000 mg | Freq: Once | INTRAMUSCULAR | Status: DC | PRN

## 2020-09-03 MED ORDER — ALBUTEROL SULFATE HFA 108 (90 BASE) MCG/ACT IN AERS: 2.0000 | INHALATION_SPRAY | Freq: Once | RESPIRATORY_TRACT | Status: DC | PRN

## 2020-09-03 MED ORDER — SODIUM CHLORIDE 0.9 % IV SOLN: Freq: Once | INTRAVENOUS | Status: DC | PRN

## 2020-09-03 MED ORDER — DIPHENHYDRAMINE HCL 50 MG/ML IJ SOLN: 50.0000 mg | Freq: Once | INTRAMUSCULAR | Status: DC | PRN

## 2020-09-03 MED ORDER — TEZEPELUMAB-EKKO 210 MG/1.91ML ~~LOC~~ SOSY
210.0000 mg | PREFILLED_SYRINGE | Freq: Once | SUBCUTANEOUS | Status: AC
  Administered 2020-09-03: 210 mg via SUBCUTANEOUS
  Filled 2020-09-03: qty 1.91

## 2020-09-03 MED ORDER — FAMOTIDINE IN NACL 20-0.9 MG/50ML-% IV SOLN: 20.0000 mg | Freq: Once | INTRAVENOUS | Status: DC | PRN

## 2020-09-03 MED ORDER — EPINEPHRINE 0.3 MG/0.3ML IJ SOAJ: 0.3000 mg | Freq: Once | INTRAMUSCULAR | Status: DC | PRN

## 2020-09-03 NOTE — Progress Notes (Signed)
Diagnosis: Asthma  Provider:  Marshell Garfinkel, MD  Procedure: Injection  Tezspire, Dose: 210mg  , Site: subcutaneous  Discharge: Condition: Good, Destination: Home . AVS provided to patient.   Performed by:  Oren Beckmann, RN

## 2020-09-03 NOTE — Telephone Encounter (Signed)
Received PA request form from [facility].  Completed PA form.  Faxed PA requested form, with OV notes from Dr. Annamaria Boots to [facility] Rx [phone]. #[id number]

## 2020-09-05 ENCOUNTER — Telehealth: Payer: Self-pay | Admitting: Internal Medicine

## 2020-09-05 NOTE — Telephone Encounter (Addendum)
JP calling from [place] about appeal for medication Tezspire. They are reviewing the appeal,just wanted to let us know.   Phone# [phone]

## 2020-09-05 NOTE — Telephone Encounter (Signed)
Adding this to currently open note regarding appeal process.

## 2020-09-07 ENCOUNTER — Telehealth: Payer: Self-pay | Admitting: Internal Medicine

## 2020-09-07 MED ORDER — ALBUTEROL SULFATE HFA 108 (90 BASE) MCG/ACT IN AERS: 2.0000 | INHALATION_SPRAY | Freq: Four times a day (QID) | RESPIRATORY_TRACT | 3 refills | Status: DC | PRN

## 2020-09-07 NOTE — Telephone Encounter (Signed)
Pt spouse Laura(ok per dpr) calling because although the PA was approved,[facility] states the Ventolin rx needs to be written for "18gx3 for a total of 54g"so it's a 3 month supply with refills for a year.Please advise [phone].Pharmacy-[facility] on [address]

## 2020-09-07 NOTE — Telephone Encounter (Signed)
Spoke with the pt's spouse  90 day supply ventolin sent  Nothing further needed

## 2020-09-07 NOTE — Telephone Encounter (Signed)
Patient checking on PA for Ventolin. Patient phone number is [phone].

## 2020-09-07 NOTE — Telephone Encounter (Signed)
Called [facility] to check status of ventolin hfa PA  Was advise by rep that the PA has been approved  [facility] and informed staff of approval  Called the pt and made aware  Nothing further needed

## 2020-09-14 NOTE — Telephone Encounter (Signed)
LVM with JP at [facility] requesting she return call to discuss status of appeal. Direct office line provided and permission to leave a message given. Will f/u next week.

## 2020-09-18 NOTE — Telephone Encounter (Addendum)
Received call from [place] from [place] stating patient's Eric [name] was approved through the medical benefit for 6 months- 09/06/20 through 03/08/21. Requested approval letter to be faxed to Pharmacy office.   Service Request# [id number]  Fax has been received and will be provided to Infusion team.

## 2020-09-21 NOTE — Telephone Encounter (Signed)
Encounter created in error, please disregard.  

## 2020-09-25 NOTE — Telephone Encounter (Signed)
Tezspire aoproval letter received (through MEDICAL benefit) and sent to scan center to file in patient's chart along with previous auth denials for future reference.

## 2020-10-01 ENCOUNTER — Ambulatory Visit (INDEPENDENT_AMBULATORY_CARE_PROVIDER_SITE_OTHER): Payer: Managed Care, Other (non HMO)

## 2020-10-01 ENCOUNTER — Other Ambulatory Visit: Payer: Self-pay

## 2020-10-01 VITALS — BP 137/81 | HR 67 | Temp 98.1°F | Resp 20

## 2020-10-01 DIAGNOSIS — J449 Chronic obstructive pulmonary disease, unspecified: Secondary | ICD-10-CM

## 2020-10-01 MED ORDER — ALBUTEROL SULFATE HFA 108 (90 BASE) MCG/ACT IN AERS: 2.0000 | INHALATION_SPRAY | Freq: Once | RESPIRATORY_TRACT | Status: DC | PRN

## 2020-10-01 MED ORDER — TEZEPELUMAB-EKKO 210 MG/1.91ML ~~LOC~~ SOSY
210.0000 mg | PREFILLED_SYRINGE | Freq: Once | SUBCUTANEOUS | Status: AC
  Administered 2020-10-01: 210 mg via SUBCUTANEOUS
  Filled 2020-10-01: qty 1.91

## 2020-10-01 MED ORDER — DIPHENHYDRAMINE HCL 50 MG/ML IJ SOLN: 50.0000 mg | Freq: Once | INTRAMUSCULAR | Status: DC | PRN

## 2020-10-01 MED ORDER — SODIUM CHLORIDE 0.9 % IV SOLN: Freq: Once | INTRAVENOUS | Status: DC | PRN

## 2020-10-01 MED ORDER — FAMOTIDINE IN NACL 20-0.9 MG/50ML-% IV SOLN: 20.0000 mg | Freq: Once | INTRAVENOUS | Status: DC | PRN

## 2020-10-01 MED ORDER — EPINEPHRINE 0.3 MG/0.3ML IJ SOAJ: 0.3000 mg | Freq: Once | INTRAMUSCULAR | Status: DC | PRN

## 2020-10-01 MED ORDER — METHYLPREDNISOLONE SODIUM SUCC 125 MG IJ SOLR: 125.0000 mg | Freq: Once | INTRAMUSCULAR | Status: DC | PRN

## 2020-10-01 NOTE — Progress Notes (Signed)
Diagnosis: Asthma  Provider:  Marshell Garfinkel, MD  Procedure: Injection  tezspire, Dose: 210 mg , Site: subcutaneous left arm   Discharge: Condition: Good, Destination: Home . AVS provided to patient.   Performed by:  Mylo Driskill, Sherlon Handing, LPN

## 2020-10-02 ENCOUNTER — Other Ambulatory Visit (HOSPITAL_COMMUNITY): Payer: Self-pay

## 2020-10-12 ENCOUNTER — Telehealth: Payer: Self-pay | Admitting: Internal Medicine

## 2020-10-15 NOTE — Telephone Encounter (Signed)
Appeal for Tezspire through medical benefit approved. Patient approved through 03/08/21.  Knox Saliva, PharmD, MPH, BCPS Clinical Pharmacist (Rheumatology and Pulmonology)

## 2020-10-29 ENCOUNTER — Other Ambulatory Visit: Payer: Self-pay

## 2020-10-29 ENCOUNTER — Ambulatory Visit (INDEPENDENT_AMBULATORY_CARE_PROVIDER_SITE_OTHER): Payer: Managed Care, Other (non HMO)

## 2020-10-29 VITALS — BP 129/72 | HR 74 | Temp 98.4°F | Resp 16

## 2020-10-29 DIAGNOSIS — J449 Chronic obstructive pulmonary disease, unspecified: Secondary | ICD-10-CM

## 2020-10-29 DIAGNOSIS — J4489 Other specified chronic obstructive pulmonary disease: Secondary | ICD-10-CM

## 2020-10-29 MED ORDER — METHYLPREDNISOLONE SODIUM SUCC 125 MG IJ SOLR: 125.0000 mg | Freq: Once | INTRAMUSCULAR | Status: DC | PRN

## 2020-10-29 MED ORDER — SODIUM CHLORIDE 0.9 % IV SOLN: Freq: Once | INTRAVENOUS | Status: DC | PRN

## 2020-10-29 MED ORDER — EPINEPHRINE 0.3 MG/0.3ML IJ SOAJ: 0.3000 mg | Freq: Once | INTRAMUSCULAR | Status: DC | PRN

## 2020-10-29 MED ORDER — DIPHENHYDRAMINE HCL 50 MG/ML IJ SOLN: 50.0000 mg | Freq: Once | INTRAMUSCULAR | Status: DC | PRN

## 2020-10-29 MED ORDER — TEZEPELUMAB-EKKO 210 MG/1.91ML ~~LOC~~ SOSY
210.0000 mg | PREFILLED_SYRINGE | Freq: Once | SUBCUTANEOUS | Status: AC
  Administered 2020-10-29: 210 mg via SUBCUTANEOUS
  Filled 2020-10-29: qty 1.91

## 2020-10-29 MED ORDER — FAMOTIDINE IN NACL 20-0.9 MG/50ML-% IV SOLN: 20.0000 mg | Freq: Once | INTRAVENOUS | Status: DC | PRN

## 2020-10-29 MED ORDER — ALBUTEROL SULFATE HFA 108 (90 BASE) MCG/ACT IN AERS: 2.0000 | INHALATION_SPRAY | Freq: Once | RESPIRATORY_TRACT | Status: DC | PRN

## 2020-10-29 NOTE — Progress Notes (Signed)
Diagnosis: Asthma  Provider:  Marshell Garfinkel, MD  Procedure: Injection  Tezspire, Dose: '210mg'$  , Site: subcutaneous  Discharge: Condition: Good, Destination: Home . AVS provided to patient.   Performed by:  Arnoldo Morale, RN

## 2020-11-04 ENCOUNTER — Other Ambulatory Visit: Payer: Self-pay | Admitting: Internal Medicine

## 2020-11-05 ENCOUNTER — Encounter: Payer: Self-pay | Admitting: Internal Medicine

## 2020-11-26 ENCOUNTER — Other Ambulatory Visit: Payer: Self-pay

## 2020-11-26 ENCOUNTER — Ambulatory Visit (INDEPENDENT_AMBULATORY_CARE_PROVIDER_SITE_OTHER): Payer: Managed Care, Other (non HMO)

## 2020-11-26 VITALS — BP 118/71 | HR 62 | Temp 98.7°F | Resp 16 | Ht 68.0 in | Wt 168.0 lb

## 2020-11-26 DIAGNOSIS — J4489 Other specified chronic obstructive pulmonary disease: Secondary | ICD-10-CM

## 2020-11-26 DIAGNOSIS — J449 Chronic obstructive pulmonary disease, unspecified: Secondary | ICD-10-CM | POA: Diagnosis not present

## 2020-11-26 MED ORDER — DIPHENHYDRAMINE HCL 50 MG/ML IJ SOLN: 50.0000 mg | Freq: Once | INTRAMUSCULAR | Status: DC | PRN

## 2020-11-26 MED ORDER — ALBUTEROL SULFATE HFA 108 (90 BASE) MCG/ACT IN AERS: 2.0000 | INHALATION_SPRAY | Freq: Once | RESPIRATORY_TRACT | Status: DC | PRN

## 2020-11-26 MED ORDER — METHYLPREDNISOLONE SODIUM SUCC 125 MG IJ SOLR: 125.0000 mg | Freq: Once | INTRAMUSCULAR | Status: DC | PRN

## 2020-11-26 MED ORDER — EPINEPHRINE 0.3 MG/0.3ML IJ SOAJ: 0.3000 mg | Freq: Once | INTRAMUSCULAR | Status: DC | PRN

## 2020-11-26 MED ORDER — TEZEPELUMAB-EKKO 210 MG/1.91ML ~~LOC~~ SOSY
210.0000 mg | PREFILLED_SYRINGE | Freq: Once | SUBCUTANEOUS | Status: AC
  Administered 2020-11-26: 210 mg via SUBCUTANEOUS
  Filled 2020-11-26: qty 1.91

## 2020-11-26 MED ORDER — FAMOTIDINE IN NACL 20-0.9 MG/50ML-% IV SOLN: 20.0000 mg | Freq: Once | INTRAVENOUS | Status: DC | PRN

## 2020-11-26 MED ORDER — SODIUM CHLORIDE 0.9 % IV SOLN: Freq: Once | INTRAVENOUS | Status: DC | PRN

## 2020-11-26 NOTE — Progress Notes (Signed)
Diagnosis: Asthma  Provider:  Marshell Garfinkel, MD  Procedure: Injection  Tezspire, Dose: 210mg  , Site: subcutaneous  Discharge: Condition: Good, Destination: Home . AVS provided to patient.   Performed by:  Clarance Bollard, Sherlon Handing, LPN

## 2020-12-21 NOTE — Progress Notes (Deleted)
Patient ID: Eric [name], male    DOB: [birth year], 65 y.o.   MRN: [id number]  HPI male never smoker followed for chronic obstructive asthma, ended Xolair 08/28/2011, hx RML resection age 55, chronic sinus disease, restless legs, complicated by GERD  CF Sweat Chloride Test  Negative (remote) Office spirometry 04/08/12- severe obstructive airways disease- FVC 2.64/58%, FEV1 1.30/36%, FEV1/FVC 0.49, FEF 25-75% 0.50/14%.   CT chest 04/01/2016 showed emphysema and scarring with chronic bronchitis changes but no ILD and no bronchiectasis PFT4/4/22- Severe obstruction, no resp to BD, Nl TLC, Nl DLCO. FEV1 41% ---------------------------------------------------------------------------    06/19/20- 65 year old male never smoker followed for COPD mixed type, ended Xolair 08/28/2011, ended [name].  history RML resection age 19, chronic sinus disease, Steroid dependent/ Adrenal Insufficiency, restless legs, complicated by GERD ended Dupixent.  -Symbicort 160, Hydrocortisone maint .Neb Atrovent/ albuterol, albuterol hfa,   Hydrocortisone 15 mg daily,  Tezpire inj on 06/09/20 Covid vax- 3 Phizer Flu vax-had Breztri no better than Symbicort. Now stabilized on hydrocortisone 20 mg daily. Since starting Tezpire ( second dose today), he has been able to reduce cortisone from 20 mg and stop using nebulizer machine. Early, but he thinks [name] is making a real difference. His baseline is severe obstruction, but there is a definite reacctive component currently obscured by Symbicort and hydrocortisone.  For PFT later today.    PFT4/4/22- Severe obstruction, no resp to BD, Nl TLC, Nl DLCO. FEV1 41%  12/24/20- 65 year old male never smoker followed for COPD mixed type, ended Xolair 08/28/2011, ended [name].  history RML resection age 46, chronic sinus disease, Steroid dependent/ Adrenal Insufficiency, restless legs, complicated by GERD ended Dupixent.  -Symbicort 160, Hydrocortisone maint .Neb Atrovent/ albuterol,  albuterol hfa,   Hydrocortisone 15 mg daily,  Tezpire inj on 06/09/20 Covid vax- 3 Phizer Flu vax-  CXR 03/31/19- IMPRESSION: 1. No acute cardiopulmonary disease.  Review of Systems-See HPI   + = positive Constitutional:   No weight loss, night sweats,  Fevers, chills, fatigue, lassitude. HEENT:   No headaches,  Difficulty swallowing,  Tooth/dental problems,  Sore throat,                No sneezing, itching, ear ache, +-nasal congestion, post nasal drip,  CV:   chest pain, orthopnea, PND, swelling in lower extremities, anasarca, dizziness, palpitations GI    Heartburn, indigestion, no-abdominal pain, nausea, vomiting, Resp:    +-productive cough,  + non-productive cough,  No coughing up of blood.  No change in color of mucus.                                       wheezing. +Short of breath mainly w/ exertion.  Skin: no rash or lesions. GU:  MS:  No joint pain or swelling. Marland Kitchen Psych:  No change in mood or affect. No depression or anxiety.  No memory loss.  Objective:   Physical Exam General- Alert, Oriented, Affect-appropriate, Distress- none acute,  Looks well-Not cushingoid  Skin- +echymoses on arms- chronic steroids  Lymphadenopathy- none Head- atraumatic            Eyes- Gross vision intact, PERRLA, conjunctivae- clear secretions            Ears- Hearing, canals normal            Nose- +  stuffy, no-Septal dev,  polyps, erosion, perforation  Throat- Mallampati II , mucosa clear , drainage- none, tonsils- atrophic.  Neck- flexible , trachea midline, no stridor , thyroid nl, carotid no bruit Chest - symmetrical excursion , unlabored           Heart/CV- RRR , no murmur , no gallop  , no rub, nl s1 s2                           - JVD- none , edema- none, stasis changes- none, varices- none           Lung-    Wheeze+ bilateral/ unlabored , able to speak full sentences. Cough - none,                                          dullness-none, rub- none.            Chest wall-   Abd- Br/ Gen/ Rectal- Not done, not indicated Extrem- cyanosis- none, clubbing, none, atrophy- none, strength- nl Neuro- grossly intact to observation

## 2020-12-24 ENCOUNTER — Ambulatory Visit: Payer: Managed Care, Other (non HMO)

## 2020-12-24 ENCOUNTER — Ambulatory Visit: Payer: Managed Care, Other (non HMO) | Admitting: Internal Medicine

## 2020-12-28 ENCOUNTER — Other Ambulatory Visit: Payer: Self-pay

## 2020-12-28 ENCOUNTER — Ambulatory Visit (INDEPENDENT_AMBULATORY_CARE_PROVIDER_SITE_OTHER): Payer: Managed Care, Other (non HMO)

## 2020-12-28 VITALS — BP 128/75 | HR 65 | Temp 98.4°F | Resp 16 | Ht 68.0 in | Wt 161.4 lb

## 2020-12-28 DIAGNOSIS — J449 Chronic obstructive pulmonary disease, unspecified: Secondary | ICD-10-CM | POA: Diagnosis not present

## 2020-12-28 MED ORDER — SODIUM CHLORIDE 0.9 % IV SOLN: Freq: Once | INTRAVENOUS | Status: DC | PRN

## 2020-12-28 MED ORDER — ALBUTEROL SULFATE HFA 108 (90 BASE) MCG/ACT IN AERS: 2.0000 | INHALATION_SPRAY | Freq: Once | RESPIRATORY_TRACT | Status: DC | PRN

## 2020-12-28 MED ORDER — METHYLPREDNISOLONE SODIUM SUCC 125 MG IJ SOLR: 125.0000 mg | Freq: Once | INTRAMUSCULAR | Status: DC | PRN

## 2020-12-28 MED ORDER — EPINEPHRINE 0.3 MG/0.3ML IJ SOAJ: 0.3000 mg | Freq: Once | INTRAMUSCULAR | Status: DC | PRN

## 2020-12-28 MED ORDER — FAMOTIDINE IN NACL 20-0.9 MG/50ML-% IV SOLN: 20.0000 mg | Freq: Once | INTRAVENOUS | Status: DC | PRN

## 2020-12-28 MED ORDER — TEZEPELUMAB-EKKO 210 MG/1.91ML ~~LOC~~ SOSY
210.0000 mg | PREFILLED_SYRINGE | Freq: Once | SUBCUTANEOUS | Status: AC
  Administered 2020-12-28: 210 mg via SUBCUTANEOUS
  Filled 2020-12-28: qty 1.91

## 2020-12-28 MED ORDER — DIPHENHYDRAMINE HCL 50 MG/ML IJ SOLN: 50.0000 mg | Freq: Once | INTRAMUSCULAR | Status: DC | PRN

## 2020-12-28 NOTE — Progress Notes (Signed)
Diagnosis: Asthma- COPD overlap Syndrome  Provider:  Marshell Garfinkel, MD  Procedure: Injection  Tezspire, Dose: 210 , Site: subcutaneous  Discharge: Condition: Good, Destination: Home . AVS provided to patient.   Performed by:  Charlie Pitter, RN

## 2021-01-01 NOTE — Telephone Encounter (Signed)
Received fax from [employer] stating that the benefits investigation for this patient has been processed and that the prescription has been triaged to [facility] as of 12/27/2020. [facility] is supposed to contact the patient to coordinate medication delivery upon receipt of prescription.

## 2021-01-21 ENCOUNTER — Ambulatory Visit: Payer: Managed Care, Other (non HMO)

## 2021-01-25 ENCOUNTER — Other Ambulatory Visit: Payer: Self-pay

## 2021-01-25 ENCOUNTER — Ambulatory Visit (INDEPENDENT_AMBULATORY_CARE_PROVIDER_SITE_OTHER): Payer: Managed Care, Other (non HMO)

## 2021-01-25 VITALS — BP 116/71 | HR 71 | Temp 99.2°F | Resp 20 | Ht 68.0 in | Wt 162.6 lb

## 2021-01-25 DIAGNOSIS — J449 Chronic obstructive pulmonary disease, unspecified: Secondary | ICD-10-CM | POA: Diagnosis not present

## 2021-01-25 MED ORDER — METHYLPREDNISOLONE SODIUM SUCC 125 MG IJ SOLR: 125.0000 mg | Freq: Once | INTRAMUSCULAR | Status: DC | PRN

## 2021-01-25 MED ORDER — ALBUTEROL SULFATE HFA 108 (90 BASE) MCG/ACT IN AERS: 2.0000 | INHALATION_SPRAY | Freq: Once | RESPIRATORY_TRACT | Status: DC | PRN

## 2021-01-25 MED ORDER — FAMOTIDINE IN NACL 20-0.9 MG/50ML-% IV SOLN: 20.0000 mg | Freq: Once | INTRAVENOUS | Status: DC | PRN

## 2021-01-25 MED ORDER — DIPHENHYDRAMINE HCL 50 MG/ML IJ SOLN: 50.0000 mg | Freq: Once | INTRAMUSCULAR | Status: DC | PRN

## 2021-01-25 MED ORDER — EPINEPHRINE 0.3 MG/0.3ML IJ SOAJ: 0.3000 mg | Freq: Once | INTRAMUSCULAR | Status: DC | PRN

## 2021-01-25 MED ORDER — TEZEPELUMAB-EKKO 210 MG/1.91ML ~~LOC~~ SOSY
210.0000 mg | PREFILLED_SYRINGE | Freq: Once | SUBCUTANEOUS | Status: AC
  Administered 2021-01-25: 210 mg via SUBCUTANEOUS
  Filled 2021-01-25: qty 1.91

## 2021-01-25 MED ORDER — SODIUM CHLORIDE 0.9 % IV SOLN: Freq: Once | INTRAVENOUS | Status: DC | PRN

## 2021-01-25 NOTE — Progress Notes (Signed)
Diagnosis: Asthma  Provider:  Marshell Garfinkel, MD  Procedure: Injection  Tezepelumab (Tezspire), Dose: 210 , Site: subcutaneous  Discharge: Condition: Good, Destination: Home . AVS provided to patient.   Performed by:  Charlie Pitter, RN

## 2021-01-27 NOTE — Progress Notes (Signed)
Patient ID: Eric [name], male    DOB: [birth year], 65 y.o.   MRN: [id number]  HPI male never smoker followed for chronic obstructive asthma, ended Xolair 08/28/2011, hx RML resection age 32, chronic sinus disease, restless legs, complicated by GERD  CF Sweat Chloride Test  Negative (remote) Office spirometry 04/08/12- severe obstructive airways disease- FVC 2.64/58%, FEV1 1.30/36%, FEV1/FVC 0.49, FEF 25-75% 0.50/14%.   CT chest 04/01/2016 showed emphysema and scarring with chronic bronchitis changes but no ILD and no bronchiectasis PFT4/4/22- severe obstruction, no resp to BD, possible restriction, Nl DLCO ---------------------------------------------------------------------------   06/19/20- 65 year old male never smoker followed for COPD mixed type, ended Xolair 08/28/2011, ended [name].  history RML resection age 14, chronic sinus disease, Steroid dependent/ Adrenal Insufficiency, restless legs, complicated by GERD ended Dupixent.  -Symbicort 160, Hydrocortisone maint .Neb Atrovent/ albuterol, albuterol hfa,   Hydrocortisone 15 mg daily,  Tezpire inj on 06/09/20 Covid vax- 3 Phizer Flu vax-had Breztri no better than Symbicort. Now stabilized on hydrocortisone 20 mg daily. Since starting Tezpire ( second dose today), he has been able to reduce cortisone from 20 mg and stop using nebulizer machine. Early, but he thinks Eric [name] is making a real difference. His baseline is severe obstruction, but there iss a definite reacctive component currently obscured by Symbicort and hydrocortisone.  PFT4/4/22- severe obstruction, no resp to BD, possible restriction, Nl DLCO  01/29/21-  65 year old male never smoker followed for COPD mixed type, ended Xolair 08/28/2011, ended [name].  history RML resection age 57, chronic sinus disease, Steroid dependent/ Adrenal Insufficiency, restless legs, complicated by GERD ended Dupixent.> Tezspire -Symbicort 160, Hydrocortisone maint .Neb Atrovent/ albuterol, albuterol  hfa,   Hydrocortisone 15 mg daily,  Tezpire inj on 06/09/20 Covid vax- 3 Phizer, 1 Moderna Flu vax-had He wheezes all the time now.  Eric [name] has not made the difference we hoped..  He has tried [name], [name], [name].  He had significant improvement initially with [name] and at that time discontinued disability status.  We discussed going back to retry [name] which eventually seemed to fail.  We agreed to wait on this until after the holidays.  He cannot walk 100 yards due to dyspnea.  No obvious infection or acute event and no fluid retention, chest pain or palpitation.  He feels he should reapply for disability status and I agree.  I do not think he can perform any gainful employment now.  Review of Systems-See HPI   + = positive Constitutional:   No weight loss, night sweats,  Fevers, chills, fatigue, lassitude. HEENT:   No headaches,  Difficulty swallowing,  Tooth/dental problems,  Sore throat,                No sneezing, itching, ear ache, +-nasal congestion, post nasal drip,  CV:   chest pain, orthopnea, PND, swelling in lower extremities, anasarca, dizziness, palpitations GI    Heartburn, indigestion, no-abdominal pain, nausea, vomiting, Resp:    +-productive cough,  + non-productive cough,  No coughing up of blood.  No change in color of mucus.                                       wheezing. +Short of breath mainly w/ exertion.  Skin: no rash or lesions. GU:  MS:  No joint pain or swelling. Marland Kitchen Psych:  No change in mood or affect. No depression or anxiety.  No memory loss.  Objective:   Physical Exam General- Alert, Oriented, Affect-appropriate, Distress- none acute,  Looks well-Not cushingoid  Skin- +echymoses on arms- chronic steroids  Lymphadenopathy- none Head- atraumatic            Eyes- Gross vision intact, PERRLA, conjunctivae- clear secretions            Ears- Hearing, canals normal            Nose- +  stuffy, no-Septal dev,  polyps, erosion, perforation              Throat- Mallampati II , mucosa clear , drainage- none, tonsils- atrophic.  Neck- flexible , trachea midline, no stridor , thyroid nl, carotid no bruit Chest - symmetrical excursion , unlabored           Heart/CV- RRR , no murmur , no gallop  , no rub, nl s1 s2                           - JVD- none , edema- none, stasis changes- none, varices- none           Lung-    Wheeze+ bilateral/ unlabored , able to speak full sentences. Cough - none,                                          dullness-none, rub- none.            Chest wall-  Abd- Br/ Gen/ Rectal- Not done, not indicated Extrem- cyanosis- none, clubbing, none, atrophy- none, strength- nl Neuro- grossly intact to observation

## 2021-01-28 ENCOUNTER — Telehealth: Payer: Self-pay | Admitting: Internal Medicine

## 2021-01-29 ENCOUNTER — Other Ambulatory Visit: Payer: Self-pay

## 2021-01-29 ENCOUNTER — Encounter: Payer: Self-pay | Admitting: Internal Medicine

## 2021-01-29 ENCOUNTER — Ambulatory Visit: Payer: Managed Care, Other (non HMO) | Admitting: Internal Medicine

## 2021-01-29 DIAGNOSIS — F419 Anxiety disorder, unspecified: Secondary | ICD-10-CM | POA: Diagnosis not present

## 2021-01-29 DIAGNOSIS — J449 Chronic obstructive pulmonary disease, unspecified: Secondary | ICD-10-CM | POA: Diagnosis not present

## 2021-01-29 DIAGNOSIS — F32A Depression, unspecified: Secondary | ICD-10-CM

## 2021-01-29 MED ORDER — ALPRAZOLAM 0.5 MG PO TABS
0.5000 mg | ORAL_TABLET | Freq: Two times a day (BID) | ORAL | 1 refills | Status: DC | PRN
Start: 2021-01-29 — End: 2021-06-03

## 2021-01-29 MED ORDER — BUDESONIDE-FORMOTEROL FUMARATE 160-4.5 MCG/ACT IN AERO: INHALATION_SPRAY | RESPIRATORY_TRACT | 3 refills | Status: DC

## 2021-01-29 NOTE — Assessment & Plan Note (Signed)
Severe chronic obstructive airways disease with limited response to bronchodilator, always wheezing.  Steroid-dependent. Plan-after the holidays we will switch him from [place] back to [place].  I think he should be considered totally and permanently disabled on the basis of his chronic lung disease.

## 2021-01-29 NOTE — Assessment & Plan Note (Signed)
He asks refill Xanax used to help sleep and occasional anxiety related to chronic illness.

## 2021-01-29 NOTE — Patient Instructions (Signed)
Refills sent to [facility] for Symbicort and Xanax  We agreed to continue Tezspire through the holidays. After that we can look at changing back to [facility]  Please call if we can help

## 2021-02-14 ENCOUNTER — Telehealth: Payer: Self-pay | Admitting: Pharmacy Technician

## 2021-02-14 NOTE — Telephone Encounter (Addendum)
Auth Submission:  APPROVED Payer: [employer] Medication & CPT/J Code(s) submitted: Tezspire [place] Fraise) [phone] Route of submission (phone, fax, portal): [place]  RJWBD:[phone] Auth type: pharmacy Units/visits requested: 210 MG Q4WKS Reference number: [place] - [id number] APPROVED: 05/24/20 - 05/21/21

## 2021-02-26 ENCOUNTER — Other Ambulatory Visit: Payer: Self-pay

## 2021-02-26 ENCOUNTER — Ambulatory Visit (INDEPENDENT_AMBULATORY_CARE_PROVIDER_SITE_OTHER): Payer: Managed Care, Other (non HMO)

## 2021-02-26 VITALS — BP 130/69 | HR 68 | Temp 98.7°F | Resp 18 | Ht 68.0 in | Wt 164.4 lb

## 2021-02-26 DIAGNOSIS — J449 Chronic obstructive pulmonary disease, unspecified: Secondary | ICD-10-CM | POA: Diagnosis not present

## 2021-02-26 MED ORDER — EPINEPHRINE 0.3 MG/0.3ML IJ SOAJ: 0.3000 mg | Freq: Once | INTRAMUSCULAR | Status: DC | PRN

## 2021-02-26 MED ORDER — METHYLPREDNISOLONE SODIUM SUCC 125 MG IJ SOLR: 125.0000 mg | Freq: Once | INTRAMUSCULAR | Status: DC | PRN

## 2021-02-26 MED ORDER — DIPHENHYDRAMINE HCL 50 MG/ML IJ SOLN: 50.0000 mg | Freq: Once | INTRAMUSCULAR | Status: DC | PRN

## 2021-02-26 MED ORDER — FAMOTIDINE IN NACL 20-0.9 MG/50ML-% IV SOLN: 20.0000 mg | Freq: Once | INTRAVENOUS | Status: DC | PRN

## 2021-02-26 MED ORDER — SODIUM CHLORIDE 0.9 % IV SOLN: Freq: Once | INTRAVENOUS | Status: DC | PRN

## 2021-02-26 MED ORDER — TEZEPELUMAB-EKKO 210 MG/1.91ML ~~LOC~~ SOSY
210.0000 mg | PREFILLED_SYRINGE | Freq: Once | SUBCUTANEOUS | Status: AC
  Administered 2021-02-26: 10:00:00 210 mg via SUBCUTANEOUS
  Filled 2021-02-26: qty 1.91

## 2021-02-26 MED ORDER — ALBUTEROL SULFATE HFA 108 (90 BASE) MCG/ACT IN AERS: 2.0000 | INHALATION_SPRAY | Freq: Once | RESPIRATORY_TRACT | Status: DC | PRN

## 2021-02-26 NOTE — Telephone Encounter (Signed)
Assessment & Plan Note by Deneise Lever, MD at 01/29/2021 7:27 PM  Author: Deneise Lever, MD Author Type: Physician Filed: 01/29/2021  7:28 PM  Note Status: Written Cosign: Cosign Not Required Encounter Date: 01/29/2021  Problem: Asthma-COPD overlap syndrome [facility])  Editor: Deneise Lever, MD (Physician)             Severe chronic obstructive airways disease with limited response to bronchodilator, always wheezing.  Steroid-dependent. Plan-after the holidays we will switch him from [place] back to [place].  I think he should be considered totally and permanently disabled on the basis of his chronic lung disease.

## 2021-02-26 NOTE — Progress Notes (Signed)
Diagnosis: Asthma ? ?Provider:  Praveen Mannam, MD ? ?Procedure: Injection ? ?Tezepelumab (Tezspire), Dose: 210 mg , Site: subcutaneous, Number of injections: 1 ? ?Discharge: Condition: Good, Destination: Home . AVS provided to patient.  ? ?Performed by:  Amarylis Rovito E, LPN  ? ? ? ?  ?

## 2021-02-27 NOTE — Telephone Encounter (Addendum)
error 

## 2021-03-06 ENCOUNTER — Other Ambulatory Visit: Payer: Self-pay | Admitting: Pharmacy Technician

## 2021-03-21 ENCOUNTER — Telehealth: Payer: Self-pay

## 2021-03-21 NOTE — Telephone Encounter (Signed)
Received notification from [place] regarding a prior authorization for  TEZSPIRE . Authorization has been APPROVED from 03/11/2021 to 03/10/2022. Approval letter sent to scan center.  Authorization # [id number]

## 2021-03-26 ENCOUNTER — Ambulatory Visit (INDEPENDENT_AMBULATORY_CARE_PROVIDER_SITE_OTHER): Payer: Managed Care, Other (non HMO)

## 2021-03-26 ENCOUNTER — Other Ambulatory Visit: Payer: Self-pay

## 2021-03-26 VITALS — BP 109/74 | HR 68 | Temp 98.5°F | Resp 16 | Ht 68.0 in | Wt 163.8 lb

## 2021-03-26 DIAGNOSIS — J449 Chronic obstructive pulmonary disease, unspecified: Secondary | ICD-10-CM | POA: Diagnosis not present

## 2021-03-26 MED ORDER — SODIUM CHLORIDE 0.9 % IV SOLN: Freq: Once | INTRAVENOUS | Status: DC | PRN

## 2021-03-26 MED ORDER — METHYLPREDNISOLONE SODIUM SUCC 125 MG IJ SOLR: 125.0000 mg | Freq: Once | INTRAMUSCULAR | Status: DC | PRN

## 2021-03-26 MED ORDER — TEZEPELUMAB-EKKO 210 MG/1.91ML ~~LOC~~ SOSY
210.0000 mg | PREFILLED_SYRINGE | Freq: Once | SUBCUTANEOUS | Status: AC
  Administered 2021-03-26: 210 mg via SUBCUTANEOUS
  Filled 2021-03-26: qty 1.91

## 2021-03-26 MED ORDER — EPINEPHRINE 0.3 MG/0.3ML IJ SOAJ: 0.3000 mg | Freq: Once | INTRAMUSCULAR | Status: DC | PRN

## 2021-03-26 MED ORDER — DIPHENHYDRAMINE HCL 50 MG/ML IJ SOLN: 50.0000 mg | Freq: Once | INTRAMUSCULAR | Status: DC | PRN

## 2021-03-26 MED ORDER — FAMOTIDINE IN NACL 20-0.9 MG/50ML-% IV SOLN: 20.0000 mg | Freq: Once | INTRAVENOUS | Status: DC | PRN

## 2021-03-26 MED ORDER — ALBUTEROL SULFATE HFA 108 (90 BASE) MCG/ACT IN AERS: 2.0000 | INHALATION_SPRAY | Freq: Once | RESPIRATORY_TRACT | Status: DC | PRN

## 2021-03-26 NOTE — Progress Notes (Signed)
Diagnosis: Asthma ? ?Provider:  Praveen Mannam, MD ? ?Procedure: Injection ? ?Tezepelumab (Tezspire), Dose: 210 mg , Site: subcutaneous, Number of injections: 1 ? ?Discharge: Condition: Good, Destination: Home . AVS provided to patient.  ? ?Performed by:  Jilian West E, LPN  ? ? ? ?  ?

## 2021-04-08 ENCOUNTER — Encounter: Payer: Self-pay | Admitting: Internal Medicine

## 2021-04-23 ENCOUNTER — Ambulatory Visit: Payer: Managed Care, Other (non HMO)

## 2021-04-24 ENCOUNTER — Ambulatory Visit (INDEPENDENT_AMBULATORY_CARE_PROVIDER_SITE_OTHER): Payer: Managed Care, Other (non HMO)

## 2021-04-24 ENCOUNTER — Other Ambulatory Visit: Payer: Self-pay

## 2021-04-24 VITALS — BP 124/78 | HR 77 | Temp 97.7°F | Resp 18 | Ht 68.0 in | Wt 164.4 lb

## 2021-04-24 DIAGNOSIS — J449 Chronic obstructive pulmonary disease, unspecified: Secondary | ICD-10-CM

## 2021-04-24 MED ORDER — SODIUM CHLORIDE 0.9 % IV SOLN: Freq: Once | INTRAVENOUS | Status: DC | PRN

## 2021-04-24 MED ORDER — DIPHENHYDRAMINE HCL 50 MG/ML IJ SOLN: 50.0000 mg | Freq: Once | INTRAMUSCULAR | Status: DC | PRN

## 2021-04-24 MED ORDER — EPINEPHRINE 0.3 MG/0.3ML IJ SOAJ: 0.3000 mg | Freq: Once | INTRAMUSCULAR | Status: DC | PRN

## 2021-04-24 MED ORDER — METHYLPREDNISOLONE SODIUM SUCC 125 MG IJ SOLR: 125.0000 mg | Freq: Once | INTRAMUSCULAR | Status: DC | PRN

## 2021-04-24 MED ORDER — ALBUTEROL SULFATE HFA 108 (90 BASE) MCG/ACT IN AERS: 2.0000 | INHALATION_SPRAY | Freq: Once | RESPIRATORY_TRACT | Status: DC | PRN

## 2021-04-24 MED ORDER — FAMOTIDINE IN NACL 20-0.9 MG/50ML-% IV SOLN: 20.0000 mg | Freq: Once | INTRAVENOUS | Status: DC | PRN

## 2021-04-24 MED ORDER — TEZEPELUMAB-EKKO 210 MG/1.91ML ~~LOC~~ SOSY
210.0000 mg | PREFILLED_SYRINGE | Freq: Once | SUBCUTANEOUS | Status: AC
  Administered 2021-04-24: 210 mg via SUBCUTANEOUS
  Filled 2021-04-24: qty 1.91

## 2021-04-24 NOTE — Progress Notes (Signed)
Diagnosis: Asthma ° °Provider:  Praveen Mannam, MD ° °Procedure: Injection ° °Tezepelumab (Tezspire), Dose: 210 , Site: subcutaneous, Number of injections: 1 ° °Discharge: Condition: Good, Destination: Home . AVS provided to patient.  ° °Performed by:  Alverna Fawley, RN  ° ° ° °  °

## 2021-05-01 NOTE — Progress Notes (Signed)
Patient ID: Eric [name], male    DOB: September 04, 1955, 66 y.o.   MRN: [id number]  HPI male never smoker followed for chronic obstructive asthma, ended Xolair 08/28/2011, hx RML resection age 29, chronic sinus disease, restless legs, complicated by GERD  CF Sweat Chloride Test  Negative (remote) Office spirometry 04/08/12- severe obstructive airways disease- FVC 2.64/58%, FEV1 1.30/36%, FEV1/FVC 0.49, FEF 25-75% 0.50/14%.   CT chest 04/01/2016 showed emphysema and scarring with chronic bronchitis changes but no ILD and no bronchiectasis PFT4/4/22- severe obstruction, no resp to BD, possible restriction, Nl DLCO ---------------------------------------------------------------------------  01/29/21-  66 year old male never smoker followed for COPD mixed type, ended Xolair 08/28/2011, ended [name].  history RML resection age 62, chronic sinus disease, Steroid dependent/ Adrenal Insufficiency, restless legs, complicated by GERD ended Dupixent.> Tezspire -Symbicort 160, Hydrocortisone maint .Neb Atrovent/ albuterol, albuterol hfa,   Hydrocortisone 15 mg daily,  Tezpire inj on 06/09/20 Covid vax- 3 Phizer, 1 Moderna Flu vax-had He wheezes all the time now.  [name] has not made the difference we hoped..  He has tried [name], [name], [name].  He had significant improvement initially with [name] and at that time discontinued disability status.  We discussed going back to retry [place] which eventually seemed to fail.  We agreed to wait on this until after the holidays.  He cannot walk 100 yards due to dyspnea.  No obvious infection or acute event and no fluid retention, chest pain or palpitation.  He feels he should reapply for disability status and I agree.  I do not think he can perform any gainful employment now.  05/02/21- 65 year old male never smoker followed for Asthma/COPD mixed type/ overlap, ended Xolair 08/28/2011, ended [name] ended Dupixent  history RML resection age 55, chronic sinus disease,  Steroid dependent/ Adrenal Insufficiency, restless legs, complicated by GERD -Symbicort 160, Hydrocortisone maint .Neb Atrovent/ albuterol, albuterol hfa,   Hydrocortisone 15 mg daily,  Tezpire inj on 06/09/20 Covid vax- 3 Phizer, 1 Moderna Flu vax-had ACT score 9 Tezspire has not been any appreciable help over the past year.  His only real success was in his first months with [name] and he is interested in going back to this if possible.  Having more wheezing with onset of spring pollen season. Had COVID infection-very mild-after Christmas with no specific therapy taken. Currently taking hydrocortisone 20 mg daily with supervision from endocrinology. He feels unable to work because of chronic and persistent severe respiratory limitation by asthma COPD overlap syndrome.  He may need support of an appeal of discontinuation of disability by [facility] Fax [phone], Insured ID [id number], DOB 08/21/1955) Daily active wheeze and cough.  He asks prednisone taper to hold, which he would take if necessary on top of the hydrocortisone.  He also asks for a prescription to hold for doxycycline.  This strategy has helped in the last few years to keep him out of the emergency room. //Note alpha-1-antitrypsin assay was nl but predated  Epic and may need update for documentation.//  Review of Systems-See HPI   + = positive Constitutional:   No weight loss, night sweats,  Fevers, chills, fatigue, lassitude. HEENT:   No headaches,  Difficulty swallowing,  Tooth/dental problems,  Sore throat,                No sneezing, itching, ear ache, +-nasal congestion, post nasal drip,  CV:   chest pain, orthopnea, PND, swelling in lower extremities, anasarca, dizziness, palpitations GI    Heartburn, indigestion, no-abdominal pain, nausea, vomiting,  Resp:    +-productive cough,  + non-productive cough,  No coughing up of blood.  No change in color of mucus.                                       wheezing. +Short of  breath mainly w/ exertion.  Skin: no rash or lesions. GU:  MS:  No joint pain or swelling. Marland Kitchen Psych:  No change in mood or affect. No depression or anxiety.  No memory loss.  Objective:   Physical Exam General- Alert, Oriented, Affect-appropriate, Distress- none acute,  Looks well-Not cushingoid  Skin- +echymoses on arms- chronic steroids  Lymphadenopathy- none Head- atraumatic            Eyes- Gross vision intact, PERRLA, conjunctivae- clear secretions            Ears- Hearing, canals normal            Nose- +  stuffy, no-Septal dev,  polyps, erosion, perforation             Throat- Mallampati II , mucosa clear , drainage- none, tonsils- atrophic.  Neck- flexible , trachea midline, no stridor , thyroid nl, carotid no bruit Chest - symmetrical excursion , unlabored           Heart/CV- RRR , no murmur , no gallop  , no rub, nl s1 s2                           - JVD- none , edema- none, stasis changes- none, varices- none           Lung-    Wheeze+ bilateral/ unlabored , able to speak full sentences.  +Cough -  deep/ bronchitic                                        dullness-none, rub- none.            Chest wall-  Abd- Br/ Gen/ Rectal- Not done, not indicated Extrem- cyanosis- none, clubbing, none, atrophy- none, strength- nl Neuro- grossly intact to observation

## 2021-05-02 ENCOUNTER — Ambulatory Visit: Payer: Managed Care, Other (non HMO) | Admitting: Internal Medicine

## 2021-05-02 ENCOUNTER — Encounter: Payer: Self-pay | Admitting: Internal Medicine

## 2021-05-02 ENCOUNTER — Other Ambulatory Visit: Payer: Self-pay

## 2021-05-02 ENCOUNTER — Telehealth: Payer: Self-pay

## 2021-05-02 ENCOUNTER — Ambulatory Visit (INDEPENDENT_AMBULATORY_CARE_PROVIDER_SITE_OTHER): Payer: Managed Care, Other (non HMO)

## 2021-05-02 VITALS — BP 120/70 | HR 80 | Temp 98.5°F | Ht 68.0 in | Wt 165.0 lb

## 2021-05-02 DIAGNOSIS — J302 Other seasonal allergic rhinitis: Secondary | ICD-10-CM | POA: Diagnosis not present

## 2021-05-02 DIAGNOSIS — J449 Chronic obstructive pulmonary disease, unspecified: Secondary | ICD-10-CM | POA: Diagnosis not present

## 2021-05-02 DIAGNOSIS — J3089 Other allergic rhinitis: Secondary | ICD-10-CM | POA: Diagnosis not present

## 2021-05-02 DIAGNOSIS — J4489 Other specified chronic obstructive pulmonary disease: Secondary | ICD-10-CM

## 2021-05-02 MED ORDER — PREDNISONE 10 MG PO TABS: ORAL_TABLET | ORAL | 0 refills | Status: DC

## 2021-05-02 MED ORDER — DOXYCYCLINE HYCLATE 100 MG PO TABS: 100.0000 mg | ORAL_TABLET | Freq: Two times a day (BID) | ORAL | 0 refills | Status: DC

## 2021-05-02 NOTE — Telephone Encounter (Addendum)
Received New start paperwork for [facility]. Will update as we work through the benefits process.  Initiated a Prior Authorization request to [employer] for [facility] via CoverMyMeds. Request has been saved, pending completion of today's OV notes.   Key: Forrestine Him Access 360 paperwork placed in PAP pending folder.

## 2021-05-02 NOTE — Patient Instructions (Addendum)
DC Tezspire  Order- start [name] per [facility]  Order- lab- CBC w diff, IgE level   dx   Asthma COPD overlap  We will appeal to [facility] their discontinuation of disability

## 2021-05-03 ENCOUNTER — Other Ambulatory Visit (HOSPITAL_COMMUNITY): Payer: Self-pay

## 2021-05-03 ENCOUNTER — Encounter: Payer: Self-pay | Admitting: Internal Medicine

## 2021-05-03 NOTE — Telephone Encounter (Signed)
Submitted PA despite open chart note due to question expiration date of 05/05/21. Will provide update once determination is received.

## 2021-05-03 NOTE — Telephone Encounter (Signed)
Received notification from [facility] regarding a prior authorization for [facility]. Authorization has been APPROVED from 05/03/2021 to 10/31/2021. Approval letter sent to scan center.  Patient must fill through [place]: [phone]  [phone] per test claim)  Authorization #  [id number]   Submitted Patient Assistance Application to  Access 016  for [facility] along with provider portion, PA and income documents. Will update patient when we receive a response.  Fax# [phone] Phone# [phone]   Patient may still need to contact program himself and speak with a Patient Navigator in order to receive additional copay assistance.

## 2021-05-06 ENCOUNTER — Encounter: Payer: Self-pay | Admitting: Internal Medicine

## 2021-05-06 ENCOUNTER — Other Ambulatory Visit: Payer: Self-pay | Admitting: Internal Medicine

## 2021-05-06 ENCOUNTER — Other Ambulatory Visit (HOSPITAL_COMMUNITY): Payer: Self-pay

## 2021-05-07 NOTE — Telephone Encounter (Signed)
Patient has Pharmacist, community and is eligible for [employer] copay card. He should not need patient assistance  Access 360 Phone: [phone] can be called for copay assistance set up  Patient scheduled for Fasenra new start on 05/23/21. Appt with infusion center cancelled  Knox Saliva, PharmD, MPH, BCPS Clinical Pharmacist (Rheumatology and Pulmonology)

## 2021-05-08 ENCOUNTER — Other Ambulatory Visit (HOSPITAL_COMMUNITY): Payer: Self-pay

## 2021-05-13 NOTE — Assessment & Plan Note (Signed)
Not well controlled. Meds and triggers reviewed. Not reasonable to expect him to maintain employment like this.  ?He is totally and permanently disabled by chronic lung disease. ?Plan- prednisone taper and doxycycline to hold. Recheck CXR, IgE, CBC w diff. DC Tezspire and return to [place], hoping for benefit he felt starting it before.  ?

## 2021-05-13 NOTE — Assessment & Plan Note (Signed)
Anticipate Spring pollen surge.  ?Plan- have flonase and antihistamine on hand. ?

## 2021-05-14 ENCOUNTER — Other Ambulatory Visit (HOSPITAL_COMMUNITY): Payer: Self-pay

## 2021-05-15 ENCOUNTER — Encounter: Payer: Self-pay | Admitting: Internal Medicine

## 2021-05-16 ENCOUNTER — Telehealth: Payer: Self-pay | Admitting: Internal Medicine

## 2021-05-16 ENCOUNTER — Other Ambulatory Visit (HOSPITAL_COMMUNITY): Payer: Self-pay

## 2021-05-16 NOTE — Telephone Encounter (Signed)
Received letter from Dr. Annamaria Boots for this patient to appeal his disability. One letter has been mailed to patient and the other has been faxed to [employer] [place] at [phone]. Nothing further needed at this time.  ?

## 2021-05-16 NOTE — Telephone Encounter (Signed)
Received notification from [facility] regarding a prior authorization for [facility] PFS. Authorization has been APPROVED from 05/16/2021 to 05/17/2022. Approval letter sent to scan center. ? ?Authorization #  [id number] ?

## 2021-05-16 NOTE — Telephone Encounter (Signed)
Submitted an additional Prior Authorization request to [facility] for [facility] PFS via CoverMyMeds. Pt has previously been on Fasenra PFS and may prefer to return to that, approval for Auto-In[employer] does not cover both. Will update once we receive a response. ? ? ?Key: [id number] ?

## 2021-05-20 ENCOUNTER — Other Ambulatory Visit: Payer: Self-pay | Admitting: Pharmacy Technician

## 2021-05-20 NOTE — Progress Notes (Unsigned)
HPI Patient presents today to [facility] to see pharmacy team for [facility] new start.  Past medical history includes ***  Patient has previoiusly taken Xolair (stopped 08/28/2011), Fasenra (started 11/02/17 and stopped 01/05/19), Dupixent (started 01/20/19, and stopped due to rash with questionable med-induced etiology), Tezspire (started 06/08/20, and last dose was 04/24/21). Patient is in hte process of applying disability. He initially responded to [place] but then  Number of hospitalizations in past year: Number of ***COPD/asthma exacerbations in past year:   Respiratory Medications Current regimen: *** Tried in past: *** Patient reports {Adherence challenges yes no:3044014::"adherence challenges","no known adherence challenges"}  OBJECTIVE Allergies  Allergen Reactions   Erythromycin Ethylsuccinate     REACTION: unspecified   Penicillins     Outpatient Encounter Medications as of 05/23/2021  Medication Sig Note   albuterol (PROVENTIL) (2.5 MG/3ML) 0.083% nebulizer solution USE 1 VIAL VIA NEBULIZER 3  TO 4 TIMES DAILY    albuterol (VENTOLIN HFA) 108 (90 Base) MCG/ACT inhaler Inhale 2 puffs into the lungs every 6 (six) hours as needed. RX for Ventolin HFA only    ALPRAZolam (XANAX) 0.5 MG tablet Take 1 tablet (0.5 mg total) by mouth 2 (two) times daily as needed.    budesonide-formoterol (SYMBICORT) 160-4.5 MCG/ACT inhaler Inhale 2 puffs then rinse mouth, twice daily    Calcium Carbonate-Vitamin D 600-200 MG-UNIT TABS Take 1 tablet by mouth 2 (two) times daily.    cetirizine (ZYRTEC) 10 MG tablet Take 10 mg by mouth daily.    citalopram (CELEXA) 20 MG tablet Take 20 mg by mouth daily.    doxycycline (VIBRA-TABS) 100 MG tablet 2 today, then 1 daily    doxycycline (VIBRA-TABS) 100 MG tablet Take 1 tablet (100 mg total) by mouth 2 (two) times daily.    EPINEPHrine 0.3 mg/0.3 mL IJ SOAJ injection Inject 0.3 mg into the muscle as needed for anaphylaxis.    fluticasone (FLONASE) 50  MCG/ACT nasal spray Use 1 to 2 sprays in each  nostril daily at bedtime as needed    hydrocortisone (CORTEF) 10 MG tablet Take 15 mg by mouth daily.     ibuprofen (ADVIL) 800 MG tablet Take 1 tablet (800 mg total) by mouth 3 (three) times daily.    ipratropium (ATROVENT) 0.02 % nebulizer solution USE 1 VIAL VIA NEBULIZER  EVERY 6 HOURS    Magnesium Cl-Calcium Carbonate (SLOW-MAG PO) Take 2 tablets by mouth daily.    Multiple Minerals-Vitamins (CITRACAL PLUS) TABS 1 tablet    Nebulizers (COMPRESSOR NEBULIZER) MISC 1 Device by Does not apply route once.    pantoprazole (PROTONIX) 20 MG tablet Take 1 tablet (20 mg total) by mouth daily.    predniSONE (DELTASONE) 10 MG tablet 4 x 2 days, 3 x 2 days, 2 x 2 days, 1 x 2 days, then stop    predniSONE (DELTASONE) 10 MG tablet 4 X 2 DAYS, 3 X 2 DAYS, 2 X 2 DAYS, 1 X 2 DAYS    Respiratory Therapy Supplies (FLUTTER) DEVI Blow through 3 times per set, three times daily    risedronate (ACTONEL) 150 MG tablet Take 1 tablet by mouth every 30 (thirty) days.     tezepelumab-ekko (TEZSPIRE) 210 MG/1.91ML SOSY Inject 210 mg into the skin every 28 (twenty-eight) days. 06/22/2020: First dose 05/11/20   No facility-administered encounter medications on file as of 05/23/2021.     Immunization History  Administered Date(s) Administered   Influenza Inj Mdck Quad Pf 11/16/2017   Influenza Split 12/09/2010, 12/10/2011, 12/08/2012,  11/25/2013, 01/05/2015, 12/09/2015, 12/08/2016, 11/16/2017   Influenza, Quadrivalent, Recombinant, Inj, Pf 12/10/2016, 12/15/2018   Influenza,inj,Quad PF,6+ Mos 11/21/2013, 12/31/2015, 12/10/2016, 12/20/2018, 12/28/2019, 12/19/2020   Influenza,inj,quad, With Preservative 12/12/2014   PFIZER(Purple Top)SARS-COV-2 Vaccination 05/20/2019, 06/10/2019, 12/28/2019   Pneumococcal Polysaccharide-23 11/17/2006   Pneumococcal-Unspecified 03/11/2007   Td 05/02/2002   Tdap 12/14/2013   Zoster Recombinat (Shingrix) 02/25/2018, 01/17/2019   Zoster, Live  06/09/2018, 01/09/2020     PFTs PFT Results Latest Ref Rng & Units 06/19/2020  FVC-Pre L 2.79  FVC-Predicted Pre % 63  FVC-Post L 2.94  FVC-Predicted Post % 67  Pre FEV1/FVC % % 49  Post FEV1/FCV % % 50  FEV1-Pre L 1.37  FEV1-Predicted Pre % 41  FEV1-Post L 1.47  DLCO uncorrected ml/min/mmHg 24.11  DLCO UNC% % 94  DLCO corrected ml/min/mmHg 24.11  DLCO COR %Predicted % 94  DLVA Predicted % 124  TLC L 6.44  TLC % Predicted % 97  RV % Predicted % 149     Eosinophils Most recent blood eosinophil count was <50 cells/microL taken on 01/05/19, 800 on 09/29/17.   IgE: 383 on 01/05/19, 287 on 10/26/18, 190 on 09/29/17   Assessment   Biologics training for benralizumab [name])  Goals of therapy: Mechanism of action: humanized afucosylated, monoclonal antibody (IgG1, kappa) that binds to the alpha subunit of the interleukin-5 receptor. IL-5 is the major cytokine responsible for the growth and differentiation, recruitment, activation, and survival of eosinophils (a cell type associated with inflammation and an important component in the pathogenesis of asthma) Reviewed that [name] is add-on medication and patient must continue maintenance inhaler regimen. Response to therapy: may take 4 months to determine efficacy. Discussed that patients generally feel improvement sooner than 4 months.  Side effects: antibody development (13%), headache (8%), pharyngitis (5%), injection site reaction (2.2%)  Dose: 30 mg subcutaneously at Week 0, Week 4, Week 8, then '30mg'$  every 8 weeks thereafter  Administration/Storage:   Reviewed administration sites of thigh or abdomen (at least 2-3 inches away from abdomen). Reviewed the upper arm is only appropriate if caregiver is administering injection  Do not shake the pen/syringe as this could lead to product foaming or precipitation. \ Reviewed storage of medication in refrigerator. Reviewed that [name] can be stored at room temperature in unopened  carton for up to 14 days.  Side effects: headache (19%), injection site reaction (7-15%), antibody development (6%), backache (5%), fatigue (5%)  Access: Approval of Fasenra through: insurance Patient enrolled into copay card program to help with copay assistance.  Medication Reconciliation  A drug regimen assessment was performed, including review of allergies, interactions, disease-state management, dosing and immunization history. Medications were reviewed with the patient, including name, instructions, indication, goals of therapy, potential side effects, importance of adherence, and safe use.  Drug interaction(s): ***  Immunizations  Patient is indicated for the influenzae, pneumonia, and shingles vaccinations. Patient has received *** COVID19 vaccines.  PLAN Continue Fasenra '30mg'$  at Week 4 on 06/20/21 and Week 8 on 07/18/21. Then continue Fasenra '30mg'$  every 8 weeks thereafter starting on 09/12/21. Rx sent to: [facility]: [phone] . Patient provided with pharmacy phone number and advised to call later this week to schedule shipment to home. Patient provided with copay card information to provide to pharmacy if quoted copay exceeds $5 per month. Continue maintenance asthma regimen of: ***  All questions encouraged and answered.  Instructed patient to reach out with any further questions or concerns.  Thank you for allowing pharmacy to participate  in this patient's care.  This appointment required 60 minutes of patient care (this includes precharting, chart review, review of results, face-to-face care, etc.).   Knox Saliva, PharmD, MPH, BCPS Clinical Pharmacist (Rheumatology and Pulmonology)

## 2021-05-22 ENCOUNTER — Telehealth: Payer: Self-pay | Admitting: Pharmacy Technician

## 2021-05-22 ENCOUNTER — Telehealth: Payer: Self-pay | Admitting: Internal Medicine

## 2021-05-22 NOTE — Telephone Encounter (Signed)
Returned call to patient to advise that appt is to start [place]. ? ?Knox Saliva, PharmD, MPH, BCPS ?Clinical Pharmacist (Rheumatology and Pulmonology) ?

## 2021-05-22 NOTE — Telephone Encounter (Signed)
Patient has transitioned from tezspire to fasenra and will now self-inject. ?[facility]cell sheet and fyi updated. ?

## 2021-05-23 ENCOUNTER — Ambulatory Visit (INDEPENDENT_AMBULATORY_CARE_PROVIDER_SITE_OTHER): Payer: Managed Care, Other (non HMO) | Admitting: Pharmacist

## 2021-05-23 ENCOUNTER — Ambulatory Visit: Payer: Managed Care, Other (non HMO)

## 2021-05-23 ENCOUNTER — Other Ambulatory Visit: Payer: Managed Care, Other (non HMO)

## 2021-05-23 ENCOUNTER — Other Ambulatory Visit: Payer: Self-pay

## 2021-05-23 DIAGNOSIS — J4550 Severe persistent asthma, uncomplicated: Secondary | ICD-10-CM

## 2021-05-23 DIAGNOSIS — Z7189 Other specified counseling: Secondary | ICD-10-CM

## 2021-05-23 MED ORDER — FASENRA PEN 30 MG/ML ~~LOC~~ SOAJ: SUBCUTANEOUS | 3 refills | Status: DC

## 2021-05-23 NOTE — Telephone Encounter (Signed)
Patient collected [place] card information at [facility] appt today. ? ?BIN: [id number] ?PCN: [id number] ?Group: [id number] ?ID: [id number] ? ?Knox Saliva, PharmD, MPH, BCPS ?Clinical Pharmacist (Rheumatology and Pulmonology) ?

## 2021-05-23 NOTE — Patient Instructions (Signed)
Your next [facility] dose is due on 06/20/21, 07/19/10, and 09/12/21 every 8 weeks thereafter ? ?CONTINUE Symbicort 160/4.37mg (2 puffs twice daily), cetirizine '10mg'$  daily, Flonasa in each nostril daily ? ?Call [employer] Access 360 for your copay card information. Their number is [phone] ? ?Once you have you copay card billing information (BIN, PCN, Group, ID), then call the pharmacy. ? ?[facility] Their phone number is [phone] Please call to schedule shipment and confirm address. They will mail your medication to your home. ? ?You will need to be seen by your provider in 3 to 4 months to assess how FASENRA is working for you. Please ensure you have a follow-up appointment scheduled in June or July 2023. Call our clinic if you need to make this appointment. ? ?How to manage an injection site reaction: ?Remember the 5 C's: ?COUNTER - leave on the counter at least 30 minutes but up to overnight to bring medication to room temperature. This may help prevent stinging ?COLD - place something cold (like an ice gel pack or cold water bottle) on the injection site just before cleansing with alcohol. This may help reduce pain ?CLARITIN - use Claritin (generic name is loratadine) for the first two weeks of treatment or the day of, the day before, and the day after injecting. This will help to minimize injection site reactions ?CORTISONE CREAM - apply if injection site is irritated and itching ?CALL ME - if injection site reaction is bigger than the size of your fist, looks infected, blisters, or if you develop hives  ?

## 2021-05-23 NOTE — Progress Notes (Signed)
Labs were not needed ?

## 2021-06-03 ENCOUNTER — Other Ambulatory Visit: Payer: Self-pay | Admitting: Internal Medicine

## 2021-06-03 DIAGNOSIS — J449 Chronic obstructive pulmonary disease, unspecified: Secondary | ICD-10-CM

## 2021-06-03 DIAGNOSIS — J4489 Other specified chronic obstructive pulmonary disease: Secondary | ICD-10-CM

## 2021-06-03 NOTE — Telephone Encounter (Signed)
Alprazolam refilled.  

## 2021-06-03 NOTE — Telephone Encounter (Signed)
Please advise on refill request. ? ? ?Allergies  ?Allergen Reactions  ? Erythromycin Ethylsuccinate   ?  REACTION: unspecified  ? Penicillins   ? ? ? ?Current Outpatient Medications:  ?  albuterol (PROVENTIL) (2.5 MG/3ML) 0.083% nebulizer solution, USE 1 VIAL VIA NEBULIZER 3  TO 4 TIMES DAILY, Disp: 1125 mL, Rfl: 1 ?  albuterol (VENTOLIN HFA) 108 (90 Base) MCG/ACT inhaler, Inhale 2 puffs into the lungs every 6 (six) hours as needed. RX for Ventolin HFA only, Disp: 54 g, Rfl: 3 ?  ALPRAZolam (XANAX) 0.5 MG tablet, Take 1 tablet (0.5 mg total) by mouth 2 (two) times daily as needed., Disp: 180 tablet, Rfl: 1 ?  Benralizumab (FASENRA PEN) 30 MG/ML SOAJ, Inject '30mg'$  into the skin at Week 4, Week 8, then every 8 weeks thereafter. Week 0 dose received in clinic on 05/23/21, Disp: 1 mL, Rfl: 3 ?  budesonide-formoterol (SYMBICORT) 160-4.5 MCG/ACT inhaler, Inhale 2 puffs then rinse mouth, twice daily, Disp: 30.6 g, Rfl: 3 ?  Calcium Carbonate-Vitamin D 600-200 MG-UNIT TABS, Take 1 tablet by mouth 2 (two) times daily., Disp: , Rfl:  ?  cetirizine (ZYRTEC) 10 MG tablet, Take 10 mg by mouth daily., Disp: , Rfl:  ?  citalopram (CELEXA) 20 MG tablet, Take 20 mg by mouth daily., Disp: , Rfl:  ?  doxycycline (VIBRA-TABS) 100 MG tablet, Take 1 tablet (100 mg total) by mouth 2 (two) times daily., Disp: 14 tablet, Rfl: 0 ?  EPINEPHrine 0.3 mg/0.3 mL IJ SOAJ injection, Inject 0.3 mg into the muscle as needed for anaphylaxis., Disp: 1 each, Rfl: 2 ?  fluticasone (FLONASE) 50 MCG/ACT nasal spray, Use 1 to 2 sprays in each  nostril daily at bedtime as needed, Disp: 48 g, Rfl: 3 ?  hydrocortisone (CORTEF) 10 MG tablet, Take 15 mg by mouth daily. , Disp: , Rfl:  ?  ibuprofen (ADVIL) 800 MG tablet, Take 1 tablet (800 mg total) by mouth 3 (three) times daily., Disp: 21 tablet, Rfl: 0 ?  ipratropium (ATROVENT) 0.02 % nebulizer solution, USE 1 VIAL VIA NEBULIZER  EVERY 6 HOURS, Disp: 937.5 mL, Rfl: 6 ?  Magnesium Cl-Calcium Carbonate (SLOW-MAG  PO), Take 2 tablets by mouth daily., Disp: , Rfl:  ?  Multiple Minerals-Vitamins (CITRACAL PLUS) TABS, 1 tablet, Disp: , Rfl:  ?  Nebulizers (COMPRESSOR NEBULIZER) MISC, 1 Device by Does not apply route once., Disp: 1 each, Rfl: 0 ?  pantoprazole (PROTONIX) 20 MG tablet, Take 1 tablet (20 mg total) by mouth daily., Disp: 90 tablet, Rfl: 3 ?  predniSONE (DELTASONE) 10 MG tablet, 4 X 2 DAYS, 3 X 2 DAYS, 2 X 2 DAYS, 1 X 2 DAYS, Disp: 20 tablet, Rfl: 0 ?  Respiratory Therapy Supplies (FLUTTER) DEVI, Blow through 3 times per set, three times daily, Disp: 1 each, Rfl: 0 ?  risedronate (ACTONEL) 150 MG tablet, Take 1 tablet by mouth every 30 (thirty) days. , Disp: , Rfl:  ? ?

## 2021-06-06 ENCOUNTER — Other Ambulatory Visit: Payer: Self-pay | Admitting: Internal Medicine

## 2021-06-19 ENCOUNTER — Telehealth: Payer: Self-pay | Admitting: Internal Medicine

## 2021-06-19 NOTE — Telephone Encounter (Signed)
Pt's spouse, Mickel Baas calling, the [employer] has discontinued pt's Long Term Disability, patient has appealed this, [employer] is wanting to know if Dr Annamaria Boots is wanting to send them more medical information from April 2021-present for consideration of benefits before making their decision.Please advise [phone] ?

## 2021-06-20 NOTE — Telephone Encounter (Signed)
Called patient and she wanted to get all the office notes from Diamond Martucci 2021. I went over to [facility] to ask her what needed to be done for this patietn and she stated that patient needs to call MEDICAL RECORDS to request all the office notes from Vasiliki Smaldone 2021 to the present, we are unable to send that many office notes.  ? ?So I called Mickel Baas back and left a detailed message giving her the number to medical records to request all those records for her husband. And that if she needed anything else in the mean time to call our office ? ?Medical Records  ?[phone] ? ?Nothing further needed  ?

## 2021-07-16 ENCOUNTER — Telehealth: Payer: Self-pay | Admitting: Internal Medicine

## 2021-07-16 NOTE — Telephone Encounter (Signed)
Patient came into [place] office and I spoke to him about his disability.  He was previously on long term disability and at some point, he and Dr. Annamaria Boots agreed he could go back to work.  His LTD expired and he is now in the process of applying for it again.  His company has stated there are 6 jobs they feel he would be able to perform. He brought a letter from [employer] and asked that Dr. Annamaria Boots write a letter explaining:  1) attendance problems on his previous job were due to his illness, 2) he will not be able to work in an environment with noxious fumes, dust or allergens, 3) previously taking '30mg'$ /day of prednisone has adversely affected his adrenal system. 4) any other medical reasons that would prevent him from working.  Deadline from [employer] [place] is 07/22/2021.  His hope is to qualify for long term disability.   Patient's handwritten notes and the [facility] letter were provided to Dr. Annamaria Boots for use when writing the letter.  Mrs. Munn is contacting [facility] to request an extension for submittal of this letter, etc.  ?

## 2021-07-17 ENCOUNTER — Encounter: Payer: Self-pay | Admitting: Internal Medicine

## 2021-07-18 NOTE — Telephone Encounter (Addendum)
Dr. Annamaria Boots wrote letter to explain medical condition.  I spoke to patient and read the letter to him over the phone.  He agreed it had all the correct information - I have faxed it to [facility] [place], attn: Norlene Duel  fax# [phone].  Patient also stated his wife spoke to someone at [facility] and they have extended his submittal deadline to 08/02/21. ?

## 2021-08-30 NOTE — Progress Notes (Signed)
Patient ID: Eric [name], male    DOB: 17-Jul-1955, 66 y.o.   MRN: [id number]  HPI male never smoker followed for chronic obstructive asthma, ended Xolair 08/28/2011, hx RML resection age 8, chronic sinus disease, restless legs, complicated by GERD  CF Sweat Chloride Test  Negative (remote) Office spirometry 04/08/12- severe obstructive airways disease- FVC 2.64/58%, FEV1 1.30/36%, FEV1/FVC 0.49, FEF 25-75% 0.50/14%.   CT chest 04/01/2016 showed emphysema and scarring with chronic bronchitis changes but no ILD and no bronchiectasis PFT4/4/22- severe obstruction, no resp to BD, possible restriction, Nl DLCO ---------------------------------------------------------------------------  05/02/21- 66 year old male never smoker followed for Asthma/COPD mixed type/ overlap, ended Xolair 08/28/2011, ended [place]., ended Dupixent  history RML resection age 45, chronic sinus disease, Steroid dependent/ Adrenal Insufficiency, restless legs, complicated by GERD -Symbicort 160, Hydrocortisone maint .Neb Atrovent/ albuterol, albuterol hfa,   Hydrocortisone 15 mg daily,  Tezpire inj on 06/09/20 Covid vax- 3 Phizer, 1 Moderna Flu vax-had ACT score 9 Tezspire has not been any appreciable help over the past year.  His only real success was in his first months with Harrington Challenger and he is interested in going back to this if possible.  Having more wheezing with onset of spring pollen season. Had COVID infection-very mild-after Christmas with no specific therapy taken. Currently taking hydrocortisone 20 mg daily with supervision from endocrinology. He feels unable to work because of chronic and persistent severe respiratory limitation by asthma COPD overlap syndrome.  He may need support of an appeal of discontinuation of disability by [facility] Fax [phone], Insured ID [id number], DOB 03/19/1955) Daily active wheeze and cough.  He asks prednisone taper to hold, which he would take if necessary on top of the  hydrocortisone.  He also asks for a prescription to hold for doxycycline.  This strategy has helped in the last few years to keep him out of the emergency room.  //Note alpha-1-antitrypsin assay was nl but predated  Epic and may need update for documentation.//  09/02/21- 66 year old male never smoker followed for Asthma/COPD mixed type/ overlap, ended Xolair 08/28/2011,  ended Dupixent , ended Tezspire, repeat trial of Fasenra, history RML resection age 64, chronic sinus disease, Steroid dependent/ Adrenal Insufficiency, restless legs, complicated by GERD -Symbicort 160, Hydrocortisone maint .Neb Atrovent/ albuterol, albuterol hfa,   Hydrocortisone 15 mg daily,  Tezpire changed back to [place] 05/23/21, Covid vax- 3 Phizer, 1 Moderna Flu vax-had ACT score 9 Using rescue inhaler couple times/ day, not needing nebulizer.  Despite some congested cough and sniffing, he feels that he is doing well by his standards so far this year.  He thinks he is producing less mucus on Fasenra since switching back. He did get approved for continuation of disability by insurance. Taking hydrocortisone 20 mg a.m. and has added 10 mg in p.m., partly trying to keep his mood up.  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  I suggested he discuss changing his antidepressant, with his PCP. CXR 05/04/21-  IMPRESSION: 1. Interval linear atelectasis in the right lower lung zone. 2. Mild bronchitic changes with improvement.    Review of Systems-See HPI   + = positive Constitutional:   No weight loss, night sweats,  Fevers, chills, fatigue, lassitude. HEENT:   No headaches,  Difficulty swallowing,  Tooth/dental problems,  Sore throat,                No sneezing, itching, ear ache, +-nasal congestion, post nasal drip,  CV:   chest pain, orthopnea, PND,  swelling in lower extremities, anasarca, dizziness, palpitations GI    Heartburn, indigestion, no-abdominal pain, nausea, vomiting, Resp:    +-productive  cough,  + non-productive cough,  No coughing up of blood.  No change in color of mucus.                                       wheezing. +Short of breath mainly w/ exertion.  Skin: no rash or lesions. GU:  MS:  No joint pain or swelling. Marland Kitchen Psych:  No change in mood or affect. No depression or anxiety.  No memory loss.  Objective:   Physical Exam General- Alert, Oriented, Affect-appropriate, Distress- none acute,  Looks well-Not cushingoid  Skin- +echymoses on arms- chronic steroids  Lymphadenopathy- none Head- atraumatic            Eyes- Gross vision intact, PERRLA, conjunctivae- clear secretions            Ears- Hearing, canals normal            Nose- +  stuffy, no-Septal dev,  polyps, erosion, perforation             Throat- Mallampati II , mucosa clear , drainage- none, tonsils- atrophic.  Neck- flexible , trachea midline, no stridor , thyroid nl, carotid no bruit Chest - symmetrical excursion , unlabored           Heart/CV- RRR , no murmur , no gallop  , no rub, nl s1 s2                           - JVD- none , edema- none, stasis changes- none, varices- none           Lung-    Wheeze+ bilateral/ unlabored , able to speak full sentences.  +Cough -  deep/ bronchitic                                        dullness-none, rub- none.            Chest wall-  Abd- Br/ Gen/ Rectal- Not done, not indicated Extrem- cyanosis- none, clubbing, none, atrophy- none, strength- nl Neuro- grossly intact to observation

## 2021-09-02 ENCOUNTER — Encounter: Payer: Self-pay | Admitting: Internal Medicine

## 2021-09-02 ENCOUNTER — Ambulatory Visit: Payer: Managed Care, Other (non HMO) | Admitting: Internal Medicine

## 2021-09-02 VITALS — BP 130/64 | HR 75 | Temp 98.3°F | Ht 68.0 in | Wt 168.6 lb

## 2021-09-02 DIAGNOSIS — J302 Other seasonal allergic rhinitis: Secondary | ICD-10-CM | POA: Diagnosis not present

## 2021-09-02 DIAGNOSIS — J449 Chronic obstructive pulmonary disease, unspecified: Secondary | ICD-10-CM | POA: Diagnosis not present

## 2021-09-02 DIAGNOSIS — J3089 Other allergic rhinitis: Secondary | ICD-10-CM | POA: Diagnosis not present

## 2021-09-02 NOTE — Assessment & Plan Note (Signed)
Note sniffing.  This is all part of the same airway process.  No changes to offer.

## 2021-09-02 NOTE — Assessment & Plan Note (Signed)
There is some variation in symptoms up and down with weather and season changes but his impression so far is that Harrington Challenger is helpful.  He still has a significant bronchitic component and I recommended he use his flutter device. Plan-continue current meds.  Lab for alpha-1 antitrypsin status.

## 2021-09-03 LAB — ALPHA-1-ANTITRYPSIN: A-1 Antitrypsin, Ser: 127 mg/dL (ref 83–199)

## 2021-09-25 ENCOUNTER — Other Ambulatory Visit: Payer: Self-pay | Admitting: Internal Medicine

## 2021-10-02 ENCOUNTER — Telehealth: Payer: Self-pay

## 2021-10-02 NOTE — Telephone Encounter (Signed)
PA renewal initiated automatically by CoverMyMeds.  Submitted a Prior Authorization request to [facility] for [facility] via CoverMyMeds. Will update once we receive a response.   Key: [id number]

## 2021-10-02 NOTE — Telephone Encounter (Signed)
Received notification from [facility] regarding a prior authorization for [facility]. Authorization has been APPROVED from 10/02/2021 to 10/03/2022. Approval letter sent to scan center.  Authorization # [id number]

## 2021-11-16 ENCOUNTER — Other Ambulatory Visit: Payer: Self-pay | Admitting: Internal Medicine

## 2021-11-16 DIAGNOSIS — J4550 Severe persistent asthma, uncomplicated: Secondary | ICD-10-CM

## 2021-11-25 NOTE — Telephone Encounter (Signed)
Refill sent for [facility] to [facility]: [phone]   Dose: 30 mg SQ every 8 weeks  Last OV: 09/02/21 Provider: Dr. Annamaria Boots  Next OV: 6 months (not yet scheduled)  [name] started on 05/23/21  Knox Saliva, PharmD, MPH, BCPS Clinical Pharmacist (Rheumatology and Pulmonology)

## 2021-12-27 ENCOUNTER — Telehealth: Payer: Self-pay | Admitting: Internal Medicine

## 2021-12-27 NOTE — Telephone Encounter (Unsigned)
Received a request from [facility] for office notes - faxed notes from 05/02/2021 and 09/02/2021 to fax# [phone].

## 2022-01-01 NOTE — Telephone Encounter (Signed)
Received another request from [facility] for office notes and faxed them again for 2/23 and 09/02/21 to fax# [phone]

## 2022-01-05 ENCOUNTER — Other Ambulatory Visit: Payer: Self-pay | Admitting: Internal Medicine

## 2022-01-05 DIAGNOSIS — J4489 Other specified chronic obstructive pulmonary disease: Secondary | ICD-10-CM

## 2022-01-06 NOTE — Telephone Encounter (Signed)
Alprazolam refilled [facility]

## 2022-01-06 NOTE — Telephone Encounter (Signed)
Patient is requesting a refill on his Xanax 0.'5mg'$ . Patient was last seen on 09/02/21 by Dr. Annamaria Boots. He was advised to follow up in 6 months, no appt has been scheduled yet. Last refill was on 06/03/21 for 180 tablets.   Dr. Annamaria Boots, please advise if you are ok with this refill. Thanks!

## 2022-01-20 ENCOUNTER — Other Ambulatory Visit: Payer: Self-pay | Admitting: Internal Medicine

## 2022-01-20 NOTE — Telephone Encounter (Signed)
Symbicort refilled.

## 2022-01-22 ENCOUNTER — Other Ambulatory Visit (HOSPITAL_COMMUNITY): Payer: Self-pay

## 2022-01-24 ENCOUNTER — Telehealth: Payer: Self-pay

## 2022-01-24 ENCOUNTER — Other Ambulatory Visit (HOSPITAL_COMMUNITY): Payer: Self-pay

## 2022-01-24 MED ORDER — ALBUTEROL SULFATE HFA 108 (90 BASE) MCG/ACT IN AERS: 2.0000 | INHALATION_SPRAY | Freq: Four times a day (QID) | RESPIRATORY_TRACT | 3 refills | Status: DC | PRN

## 2022-01-24 NOTE — Telephone Encounter (Signed)
PA request received for Ventolin HFA through [facility].  Per benefits investigation insurance covers Albuterol HFA 8.5gm (generic ProAir HFA).

## 2022-01-24 NOTE — Telephone Encounter (Signed)
Ok to substitute whichever albuterol hfa product his insurance prefers, refillable x 1 year.  Thanks

## 2022-01-24 NOTE — Addendum Note (Signed)
Addended by: Valerie Salts on: 01/24/2022 03:28 PM   Modules accepted: Orders

## 2022-01-24 NOTE — Telephone Encounter (Signed)
Called and spoke with patient about the PA. He verbalized understanding. He had tried a generic albuterol a few years ago and it did not work. He is willing to try the generic again. Confirmed that he wants a 90 day supply sent to [facility] in [place]. RX has been sent.   Nothing further needed at time of call.

## 2022-01-26 ENCOUNTER — Other Ambulatory Visit: Payer: Self-pay | Admitting: Internal Medicine

## 2022-02-06 ENCOUNTER — Other Ambulatory Visit (HOSPITAL_COMMUNITY): Payer: Self-pay

## 2022-03-01 ENCOUNTER — Telehealth: Payer: Self-pay | Admitting: Internal Medicine

## 2022-03-01 DIAGNOSIS — J4550 Severe persistent asthma, uncomplicated: Secondary | ICD-10-CM

## 2022-03-04 NOTE — Telephone Encounter (Signed)
Refill sent for [facility] to [place]: [phone]   Dose: 30 mg SQ every 8 weeks  Last OV: 09/02/2021 Provider: Dr. Annamaria Boots  Next OV: 6 months (not scheduled)  Knox Saliva, PharmD, MPH, BCPS Clinical Pharmacist (Rheumatology and Pulmonology)

## 2022-03-04 NOTE — Telephone Encounter (Signed)
Pt schedule for Dr.Young next avail 04/10/22 '@10am'$ 

## 2022-04-09 NOTE — Progress Notes (Signed)
Patient ID: Eric [name], male    DOB: 11/21/1955, 67 y.o.   MRN: [id number]  HPI male never smoker followed for chronic obstructive asthma, ended Xolair 08/28/2011, hx RML resection age 25, chronic sinus disease, restless legs, complicated by GERD  CF Sweat Chloride Test  Negative (remote) Office spirometry 04/08/12- severe obstructive airways disease- FVC 2.64/58%, FEV1 1.30/36%, FEV1/FVC 0.49, FEF 25-75% 0.50/14%.   CT chest 04/01/2016 showed emphysema and scarring with chronic bronchitis changes but no ILD and no bronchiectasis PFT4/4/22- severe obstruction, no resp to BD, possible restriction, Nl DLCO a1AT- 09/02/21- 127 ---------------------------------------------------------------------------   09/02/21- 67 year old male never smoker followed for Asthma/COPD mixed type/ overlap, ended Xolair 08/28/2011,  ended Dupixent , ended Tezspire, repeat trial of Fasenra, history RML resection age 43, chronic sinus disease, Steroid dependent/ Adrenal Insufficiency, restless legs, complicated by GERD -Symbicort 160, Hydrocortisone maint .Neb Atrovent/ albuterol, albuterol hfa,   Hydrocortisone 15 mg daily,  Tezpire changed back to [place] 05/23/21, Covid vax- 3 Phizer, 1 Moderna Flu vax-had ACT score 9 Using rescue inhaler couple times/ day, not needing nebulizer.  Despite some congested cough and sniffing, he feels that he is doing well by his standards so far this year.  He thinks he is producing less mucus on Fasenra since switching back. He did get approved for continuation of disability by insurance. Taking hydrocortisone 20 mg a.m. and has added 10 mg in p.m., partly trying to keep his mood up.  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  I suggested he discuss changing his antidepressant, with his PCP. CXR 05/04/21-  IMPRESSION: 1. Interval linear atelectasis in the right lower lung zone. 2. Mild bronchitic changes with improvement.  04/10/22-  67 year old male never  smoker followed for Asthma/COPD mixed type/ overlap, ended Xolair 08/28/2011,  ended Dupixent , ended Tezspire, repeat trial of Fasenra, history RML resection age 43, chronic sinus disease, Steroid dependent/ Adrenal Insufficiency, restless legs, complicated by GERD -Symbicort 160, Hydrocortisone maint 20 mg daily  .Neb Atrovent/ albuterol, albuterol hfa,   Hydrocortisone 15 mg daily,  Tezpire changed back to [place] 05/23/21, Covid vax- 3 Phizer, 1 Moderna Flu vax-had  ----Last 2 months pt states he has had more tightness in his chest. States allergies seem worse Blames indoor heat/ outside cold weather for increased chest tightness and nasal congestion. Doesn't feel he has infection, but asks doxycycline to hold. [name] is better than no Biologic. Has never regained the dramatic improvement he felt in first months on first trial of [name] a couple of years ago. Stable cortisone dose 20 mg daily. I wonder if the maintenance hydrocortisone provides stability masking the more dramatic response to [place] that he remembered.  Review of Systems-See HPI   + = positive Constitutional:   No weight loss, night sweats,  Fevers, chills, fatigue, lassitude. HEENT:   No headaches,  Difficulty swallowing,  Tooth/dental problems,  Sore throat,                No sneezing, itching, ear ache, +-nasal congestion, post nasal drip,  CV:   chest pain, orthopnea, PND, swelling in lower extremities, anasarca, dizziness, palpitations GI    Heartburn, indigestion, no-abdominal pain, nausea, vomiting, Resp:    +-productive cough,  + non-productive cough,  No coughing up of blood.  No change in color of mucus.  wheezing. +Short of breath mainly w/ exertion.  Skin: no rash or lesions. GU:  MS:  No joint pain or swelling. Marland Kitchen Psych:  No change in mood or affect. No depression or anxiety.  No memory loss.  Objective:   Physical Exam General- Alert, Oriented, Affect-appropriate, Distress-  none acute,  Looks well-Not cushingoid  Skin- +echymoses on arms- chronic steroids  Lymphadenopathy- none Head- atraumatic            Eyes- Gross vision intact, PERRLA, conjunctivae- clear secretions            Ears- Hearing, canals normal            Nose- +  stuffy, no-Septal dev,  polyps, erosion, perforation             Throat- Mallampati II , mucosa clear , drainage- none, tonsils- atrophic.  Neck- flexible , trachea midline, no stridor , thyroid nl, carotid no bruit Chest - symmetrical excursion , unlabored           Heart/CV- RRR , no murmur , no gallop  , no rub, nl s1 s2                           - JVD- none , edema- none, stasis changes- none, varices- none           Lung-    Diminished, Wheeze-none, but raspy cough and some sniffing , able to speak full sentences.                                        dullness-none, rub- none.            Chest wall-  Abd- Br/ Gen/ Rectal- Not done, not indicated Extrem- cyanosis- none, clubbing, none, atrophy- none, strength- nl Neuro- grossly intact to observation

## 2022-04-10 ENCOUNTER — Encounter: Payer: Self-pay | Admitting: Internal Medicine

## 2022-04-10 ENCOUNTER — Ambulatory Visit: Payer: Managed Care, Other (non HMO) | Admitting: Internal Medicine

## 2022-04-10 VITALS — BP 130/76 | HR 84 | Ht 68.0 in | Wt 166.8 lb

## 2022-04-10 DIAGNOSIS — J4489 Other specified chronic obstructive pulmonary disease: Secondary | ICD-10-CM

## 2022-04-10 DIAGNOSIS — E279 Disorder of adrenal gland, unspecified: Secondary | ICD-10-CM | POA: Diagnosis not present

## 2022-04-10 MED ORDER — DOXYCYCLINE HYCLATE 100 MG PO TABS: 100.0000 mg | ORAL_TABLET | Freq: Two times a day (BID) | ORAL | 2 refills | Status: DC

## 2022-04-10 NOTE — Patient Instructions (Signed)
Script sent for doxycycline  Order- sample x 2 Breztri if available    inhale 2 puffs thn rinse mouth, twice daily    Try this instead of Symbicort  Please call if we can help

## 2022-04-11 MED ORDER — BREZTRI AEROSPHERE 160-9-4.8 MCG/ACT IN AERO: 2.0000 | INHALATION_SPRAY | Freq: Two times a day (BID) | RESPIRATORY_TRACT | 0 refills | Status: DC

## 2022-04-22 ENCOUNTER — Other Ambulatory Visit: Payer: Self-pay | Admitting: Internal Medicine

## 2022-04-22 DIAGNOSIS — J4550 Severe persistent asthma, uncomplicated: Secondary | ICD-10-CM

## 2022-04-24 ENCOUNTER — Telehealth: Payer: Self-pay | Admitting: Pharmacist

## 2022-04-24 ENCOUNTER — Other Ambulatory Visit (HOSPITAL_COMMUNITY): Payer: Self-pay

## 2022-04-24 DIAGNOSIS — J4550 Severe persistent asthma, uncomplicated: Secondary | ICD-10-CM

## 2022-04-24 MED ORDER — FASENRA PEN 30 MG/ML ~~LOC~~ SOAJ: 30.0000 mg | SUBCUTANEOUS | 2 refills | Status: DC

## 2022-04-24 NOTE — Telephone Encounter (Signed)
Received notification from [facility] regarding a prior authorization for [facility]. Authorization has been APPROVED from 10/02/2021 to 10/03/2022. Approval letter sent to scan center.  Authorization # This medication or product was previously approved on [id number] from 2021-10-02 to 2022-10-03  Refill for [place] sent to [place] today  Knox Saliva, PharmD, MPH, BCPS, CPP Clinical Pharmacist (Rheumatology and Pulmonology)

## 2022-04-24 NOTE — Telephone Encounter (Signed)
Submitted a Prior Authorization RENEWAL request to [facility] for [facility] via CoverMyMeds. Will update once we receive a response.  Key: [id number]  Knox Saliva, PharmD, MPH, BCPS, CPP Clinical Pharmacist (Rheumatology and Pulmonology)

## 2022-05-07 ENCOUNTER — Encounter: Payer: Self-pay | Admitting: Internal Medicine

## 2022-05-07 NOTE — Assessment & Plan Note (Signed)
Nonspecific exacerbation persistent over the last 2 months.  He blames weather and nonspecific environmental issues but does not think he has an infection.  Discussed available options. Plan-try again [employer].  Doxycycline to hold.  Continue [name]

## 2022-05-07 NOTE — Assessment & Plan Note (Signed)
Maintained on hydrocortisone long-term-appreciate Endocrinology support.

## 2022-07-14 ENCOUNTER — Other Ambulatory Visit: Payer: Self-pay | Admitting: Internal Medicine

## 2022-07-14 DIAGNOSIS — M8589 Other specified disorders of bone density and structure, multiple sites: Secondary | ICD-10-CM

## 2022-08-26 ENCOUNTER — Other Ambulatory Visit: Payer: Self-pay | Admitting: Internal Medicine

## 2022-08-26 DIAGNOSIS — J4550 Severe persistent asthma, uncomplicated: Secondary | ICD-10-CM

## 2022-09-01 NOTE — Telephone Encounter (Signed)
Refill sent for [facility] to [facility]: [phone]   Dose: 30 mg every 8 weeks  Last OV: 04/10/22 Provider: [name]  Next OV: 10/09/22  Chesley Mires, PharmD, MPH, BCPS Clinical Pharmacist (Rheumatology and Pulmonology)

## 2022-09-04 ENCOUNTER — Telehealth: Payer: Self-pay | Admitting: Pharmacist

## 2022-09-04 NOTE — Telephone Encounter (Signed)
Patient's PA for Fasenra set to expire on 10/03/22. He has upcoming OV on 10/09/2022 with Dr. Roxine Caddy, PharmD, MPH, BCPS, CPP Clinical Pharmacist (Rheumatology and Pulmonology)

## 2022-09-04 NOTE — Telephone Encounter (Signed)
Harrington Challenger [facility] Key: [id number]  Per automated response: [facility] Prior Authorization Department does not manage Prior Authorizations for this plan. Please contact member services phone number on the back of the member ID card.  Will need to re-try after 10/09/2022.  Chesley Mires, PharmD, MPH, BCPS, CPP Clinical Pharmacist (Rheumatology and Pulmonology)

## 2022-10-07 NOTE — Progress Notes (Signed)
Patient ID: Eric [name], male    DOB: 1955/09/17, 67 y.o.   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   CT chest 04/01/2016 showed emphysema and scarring with chronic bronchitis changes but no ILD and no bronchiectasis PFT4/4/22- severe obstruction, no resp to BD, possible restriction, Nl DLCO a1AT- 09/02/21- 127 ---------------------------------------------------------------------------   04/10/22-  67 year old male never smoker followed for Asthma/COPD mixed type/ overlap, ended Xolair 08/28/2011,  ended Dupixent , ended Tezspire, repeat trial of Fasenra, history RML resection age 89, chronic sinus disease, Steroid dependent/ Adrenal Insufficiency, restless legs, complicated by GERD -Symbicort 160, Hydrocortisone maint 20 mg daily  .Neb Atrovent/ albuterol, albuterol hfa,   Hydrocortisone 15 mg daily,  Tezpire changed back to [place] 05/23/21, Covid vax- 3 Phizer, 1 Moderna Flu vax-had  ----Last 2 months pt states he has had more tightness in his chest. States allergies seem worse Blames indoor heat/ outside cold weather for increased chest tightness and nasal congestion. Doesn't feel he has infection, but asks doxycycline to hold. Harrington Challenger is better than no Biologic. Has never regained the dramatic improvement he felt in first months on first trial of Harrington Challenger a couple of years ago. Stable cortisone dose 20 mg daily. I wonder if the maintenance hydrocortisone provides stability masking the more dramatic response to [place] that he remembered.  10/09/22- 67 year old male never smoker followed for Asthma/COPD mixed type/ overlap, ended Xolair 08/28/2011,  ended Dupixent , ended Tezspire,  repeat trial of Fasenra, history RML resection age 4, chronic sinus disease, Steroid dependent/ Adrenal Insufficiency, restless legs, complicated by GERD -Symbicort 160, Hydrocortisone maint 20 mg daily  .Neb Atrovent/ albuterol, albuterol hfa,   Hydrocortisone 15 mg daily,  Tezpire changed back to [place] 05/23/21, -----Asthma, he states has used albuterol 1-2 times daily Room air O2 sat 97%, HR 71 Last prednisone 6 months ago.  Harrington Challenger "probably helps"- asthma is less active and he notes less allergic rhinitis. Does admit more nasal congestion in past month- using Flonase and Afrin.   Review of Systems-See HPI   + = positive Constitutional:   No weight loss, night sweats,  Fevers, chills, fatigue, lassitude. HEENT:   No headaches,  Difficulty swallowing,  Tooth/dental problems,  Sore throat,                No sneezing, itching, ear ache, +-nasal congestion, post nasal drip,  CV:   chest pain, orthopnea, PND, swelling in lower extremities, anasarca, dizziness, palpitations GI    Heartburn, indigestion, no-abdominal pain, nausea, vomiting, Resp:    +-productive cough,  + non-productive cough,  No coughing up of blood.  No change in color of mucus.                                       wheezing. +Short of breath mainly w/ exertion.  Skin: no rash or lesions. GU:  MS:  No joint pain or swelling. Marland Kitchen Psych:  No change in mood or affect. No depression or anxiety.  No memory loss.  Objective:   Physical Exam General- Alert, Oriented, Affect-appropriate, Distress- none acute,  Looks well-Not cushingoid  Skin- +echymoses on arms- chronic steroids  Lymphadenopathy- none Head- atraumatic            Eyes- Gross vision intact, PERRLA, conjunctivae- clear secretions            Ears- Hearing, canals normal            Nose- +  stuffy, no-Septal dev,  polyps-none visible, erosion, perforation             Throat- Mallampati II , mucosa clear , drainage- none, tonsils- atrophic.  Neck- flexible , trachea  midline, no stridor , thyroid nl, carotid no bruit Chest - symmetrical excursion , unlabored           Heart/CV- RRR , no murmur , no gallop  , no rub, nl s1 s2                           - JVD- none , edema- none, stasis changes- none, varices- none           Lung-    Diminished, Wheeze+diffuse,  able to speak full sentences.                                        dullness-none, rub- none.            Chest wall-  Abd- Br/ Gen/ Rectal- Not done, not indicated Extrem- cyanosis- none, clubbing, none, atrophy- none, strength- nl Neuro- grossly intact to observation

## 2022-10-09 ENCOUNTER — Encounter: Payer: Self-pay | Admitting: Internal Medicine

## 2022-10-09 ENCOUNTER — Ambulatory Visit: Payer: Managed Care, Other (non HMO) | Admitting: Internal Medicine

## 2022-10-09 VITALS — BP 122/80 | HR 71 | Temp 98.1°F | Ht 68.0 in | Wt 167.6 lb

## 2022-10-09 DIAGNOSIS — J3089 Other allergic rhinitis: Secondary | ICD-10-CM | POA: Diagnosis not present

## 2022-10-09 DIAGNOSIS — J302 Other seasonal allergic rhinitis: Secondary | ICD-10-CM

## 2022-10-09 DIAGNOSIS — J4489 Other specified chronic obstructive pulmonary disease: Secondary | ICD-10-CM | POA: Diagnosis not present

## 2022-10-09 MED ORDER — DOXYCYCLINE HYCLATE 100 MG PO TABS: 100.0000 mg | ORAL_TABLET | Freq: Two times a day (BID) | ORAL | 2 refills | Status: DC

## 2022-10-09 NOTE — Patient Instructions (Signed)
We can continue current meds as discussed  Refill sent for doxycycline to hold  Consider nasal saline rinse

## 2022-10-13 NOTE — Telephone Encounter (Signed)
PA renewal initiated automatically by CoverMyMeds.  Submitted a Prior Authorization request to [facility] for [facility] via CoverMyMeds. Will update once we receive a response.   Key: [id number]

## 2022-10-13 NOTE — Telephone Encounter (Signed)
Received notification from [facility] regarding a prior authorization for [facility]. Authorization has been APPROVED from 10/13/22 to 10/13/23. Approval letter sent to scan center.  Patient must continue to fill through [facility]: [phone]   Authorization # [id number]  Chesley Mires, PharmD, MPH, BCPS, CPP Clinical Pharmacist (Rheumatology and Pulmonology)

## 2022-10-28 ENCOUNTER — Encounter: Payer: Self-pay | Admitting: Internal Medicine

## 2022-10-28 NOTE — Assessment & Plan Note (Signed)
He will continue Harrington Challenger and current meds

## 2022-10-28 NOTE — Assessment & Plan Note (Signed)
Continue saline rinse and Flonase

## 2023-02-03 ENCOUNTER — Other Ambulatory Visit: Payer: Managed Care, Other (non HMO)

## 2023-03-27 ENCOUNTER — Telehealth: Payer: Self-pay | Admitting: Internal Medicine

## 2023-03-27 ENCOUNTER — Telehealth: Payer: Self-pay | Admitting: Pharmacist

## 2023-03-27 NOTE — Telephone Encounter (Signed)
Patient has had insurance change to Medicare. May need to enroll into AZ&Me depending on copay after insurance approved  Submitted a Prior Authorization request to [facility] for [facility] via CoverMyMeds. Will update once we receive a response.  Key: Eric [name]

## 2023-03-27 NOTE — Telephone Encounter (Signed)
We will work on [place] benefits through [employer]

## 2023-04-07 ENCOUNTER — Telehealth: Payer: Self-pay | Admitting: Internal Medicine

## 2023-04-07 ENCOUNTER — Other Ambulatory Visit: Payer: Self-pay | Admitting: Internal Medicine

## 2023-04-07 DIAGNOSIS — J4489 Other specified chronic obstructive pulmonary disease: Secondary | ICD-10-CM

## 2023-04-07 MED ORDER — ALPRAZOLAM 0.5 MG PO TABS: 0.5000 mg | ORAL_TABLET | Freq: Two times a day (BID) | ORAL | 1 refills | Status: DC | PRN

## 2023-04-07 NOTE — Telephone Encounter (Signed)
Patient dropped off long term disability form for completion by [name].  Patient was last seen on 10/12/2022 and next appointment is 05/03/2023.   I have filled out the demographics portion of the form and sent it to [name] to complete the rest of it.

## 2023-04-09 ENCOUNTER — Telehealth: Payer: Self-pay | Admitting: Internal Medicine

## 2023-04-09 DIAGNOSIS — D2261 Melanocytic nevi of right upper limb, including shoulder: Secondary | ICD-10-CM | POA: Diagnosis not present

## 2023-04-09 DIAGNOSIS — L821 Other seborrheic keratosis: Secondary | ICD-10-CM | POA: Diagnosis not present

## 2023-04-09 DIAGNOSIS — D485 Neoplasm of uncertain behavior of skin: Secondary | ICD-10-CM | POA: Diagnosis not present

## 2023-04-09 DIAGNOSIS — L905 Scar conditions and fibrosis of skin: Secondary | ICD-10-CM | POA: Diagnosis not present

## 2023-04-09 DIAGNOSIS — D225 Melanocytic nevi of trunk: Secondary | ICD-10-CM | POA: Diagnosis not present

## 2023-04-09 DIAGNOSIS — Z85820 Personal history of malignant melanoma of skin: Secondary | ICD-10-CM | POA: Diagnosis not present

## 2023-04-09 DIAGNOSIS — Z85828 Personal history of other malignant neoplasm of skin: Secondary | ICD-10-CM | POA: Diagnosis not present

## 2023-04-09 MED ORDER — BUDESONIDE-FORMOTEROL FUMARATE 160-4.5 MCG/ACT IN AERO: INHALATION_SPRAY | RESPIRATORY_TRACT | 3 refills | Status: DC

## 2023-04-09 NOTE — Telephone Encounter (Signed)
Patient is returning missed call.

## 2023-04-09 NOTE — Telephone Encounter (Signed)
I called and spoke with the pt  He c/o head congestion over the past few wks  He has been coughing a little more with clear sputum  He states that he thought about it after his initial call to use today, and he wants to just wait and talk with CY about this at next ov  He has appt here on 04/13/23  I advised call sooner if needed  Nothing further needed

## 2023-04-09 NOTE — Telephone Encounter (Signed)
Patient has a cough and some shortness of breath and would like prednisone to be called in.   Pharmacy: [facility] on [address]

## 2023-04-09 NOTE — Telephone Encounter (Signed)
Called pt and there was no answer-LMTCB

## 2023-04-10 NOTE — Progress Notes (Unsigned)
Patient ID: Eric [name], male    DOB: October 28, 1955, 68 y.o.   MRN: [id number]  HPI male never smoker followed for chronic obstructive asthma, ended Xolair 08/28/2011, hx RML resection age 21, chronic sinus disease, restless legs, complicated by GERD  CF Sweat Chloride Test  Negative (remote) Office spirometry 04/08/12- severe obstructive airways disease- FVC 2.64/58%, FEV1 1.30/36%, FEV1/FVC 0.49, FEF 25-75% 0.50/14%.   CT chest 04/01/2016 showed emphysema and scarring with chronic bronchitis changes but no ILD and no bronchiectasis PFT4/4/22- severe obstruction, no resp to BD, possible restriction, Nl DLCO a1AT- 09/02/21- 127 ---------------------------------------------------------------------------   10/09/22- 68 year old male never smoker followed for Asthma/COPD mixed type/ overlap, ended Xolair 08/28/2011,  ended Dupixent , ended Tezspire, repeat trial of Fasenra, history RML resection age 61, chronic sinus disease, Steroid dependent/ Adrenal Insufficiency, restless legs, complicated by GERD -Symbicort 160, Hydrocortisone maint 20 mg daily  .Neb Atrovent/ albuterol, albuterol hfa,   Hydrocortisone 15 mg daily,  Tezpire changed back to [name] 05/23/21, -----Asthma, he states has used albuterol 1-2 times daily Room air O2 sat 97%, HR 71 Last prednisone 6 months ago.  Eric [name] "probably helps"- asthma is less active and he notes less allergic rhinitis. Does admit more nasal congestion in past month- using Flonase and Afrin.  04/13/23- 68 year old male never smoker followed for Asthma/COPD mixed type/ overlap, ended Xolair 08/28/2011,  ended Dupixent , ended Tezspire, repeat trial of Fasenra, history RML resection age 68, chronic sinus disease, Steroid dependent/ Adrenal Insufficiency, restless legs, complicated by GERD -Symbicort 160, Hydrocortisone maint 20 mg daily  .Neb Atrovent/ albuterol, albuterol hfa,   Hydrocortisone 15 mg daily,  Tezpire changed back to [name] 05/23/21, Discussed the use of  AI scribe software for clinical note transcription with the patient, who gave verbal consent to proceed.  History of Present Illness   The patient, with a history of nasal polyps and asthma/ COPD, presents with persistent head and chest congestion that has been ongoing throughout the fall and winter. The patient describes the congestion as 'stuffy' and denies any signs of infection. The patient has attempted to mitigate potential environmental triggers by changing bedding and improving air quality in his living spaces, but the symptoms persist.  The patient is currently on Cortef (hydrocortisone) 20mg  daily for adrenal insufficiency (Dr Sharl Ma) and has been advised by his endocrinologist to double the dose in times of stress. The patient also takes Eric [name] for asthma and reports initial significant improvement with this medication, but the effect seems to have diminished over time. The patient is switching to [name] and is unsure if Eric [name] will be covered under the new plan.     Review of Systems-See HPI   + = positive Constitutional:   No weight loss, night sweats,  Fevers, chills, fatigue, lassitude. HEENT:   No headaches,  Difficulty swallowing,  Tooth/dental problems,  Sore throat,                No sneezing, itching, ear ache, +-nasal congestion, post nasal drip,  CV:   chest pain, orthopnea, PND, swelling in lower extremities, anasarca, dizziness, palpitations GI    Heartburn, indigestion, no-abdominal pain, nausea, vomiting, Resp:    +-productive cough,  + non-productive cough,  No coughing up of blood.  No change in color of mucus.  wheezing. +Short of breath mainly w/ exertion.  Skin: no rash or lesions. GU:  MS:  No joint pain or swelling. Marland Kitchen Psych:  No change in mood or affect. No depression or anxiety.  No memory loss.  Objective:   Physical Exam General- Alert, Oriented, Affect-appropriate, Distress- none acute,  Looks well-Not  cushingoid  Skin- +echymoses on arms- chronic steroids  Lymphadenopathy- none Head- atraumatic            Eyes- Gross vision intact, PERRLA, conjunctivae- clear secretions            Ears- Hearing, canals normal            Nose- +  stuffy, no-Septal dev,  polyps-none visible, erosion, perforation             Throat- Mallampati II , mucosa clear , drainage- none, tonsils- atrophic.  Neck- flexible , trachea midline, no stridor , thyroid nl, carotid no bruit Chest - symmetrical excursion , unlabored           Heart/CV- RRR , no murmur , no gallop  , no rub, nl s1 s2                           - JVD- none , edema- none, stasis changes- none, varices- none           Lung-    Diminished, Wheeze-none,  able to speak full sentences.                                        dullness-none, rub- none.            Chest wall-  Abd- Br/ Gen/ Rectal- Not done, not indicated Extrem- cyanosis- none, clubbing, none, atrophy- none, strength- nl Neuro- grossly intact to observation  Assessment and Plan    Chronic Sinusitis Persistent head congestion without signs of infection. Nasal polyps suspected but not visualized anteriorly. -Start prednisone taper from 40mg  daily for one week, then resume daily Cortef.  Asthma On Symbicort and Fasenra with some benefit. Uncertainty about continued coverage of Fasenra with new Medicare insurance. -Continue Symbicort and Eric [name] for now. -Use remaining Fasenra doses and then consider discontinuation depending on insurance coverage and symptom control. -Contact pharmacy team to discuss potential coverage of other biologics under new insurance.  Adrenal Insufficiency On Cortef 20mg  daily. Discussed potential need to double dose during times of stress per endocrinologist's advice. -Continue Cortef 20mg  daily. -Double dose as needed during times of stress.  Follow-up in 4 months or sooner if symptoms worsen.

## 2023-04-13 ENCOUNTER — Other Ambulatory Visit (HOSPITAL_COMMUNITY): Payer: Self-pay

## 2023-04-13 ENCOUNTER — Ambulatory Visit: Payer: Medicare Other | Admitting: Internal Medicine

## 2023-04-13 ENCOUNTER — Encounter: Payer: Self-pay | Admitting: Internal Medicine

## 2023-04-13 VITALS — BP 121/76 | HR 85 | Temp 97.6°F | Resp 18 | Ht 68.0 in | Wt 166.5 lb

## 2023-04-13 DIAGNOSIS — J4489 Other specified chronic obstructive pulmonary disease: Secondary | ICD-10-CM

## 2023-04-13 MED ORDER — PREDNISONE 10 MG PO TABS: ORAL_TABLET | ORAL | 0 refills | Status: AC

## 2023-04-13 NOTE — Patient Instructions (Addendum)
Prednisone taper sent. Do that instead of the Cortef, and then go back to Cortef.   Depending on your insurance coverage, we can decide whether to use up your Harrington Challenger then stop Biologic therapy for a while, or switch.  We will ask our pharmacy team if they can help determine what Biologic would be covered by your Medicare/ [facility] supplement insurance

## 2023-04-13 NOTE — Telephone Encounter (Signed)
Received notification from [facility] regarding a prior authorization for [facility]. Authorization has been APPROVED from 03/27/2023 to 09/24/2023. Approval letter sent to scan center.  Unable to run test claim because refill is too soon (last filled 03/30/2023). ATC patient to see if interested in applying for patient assistance through AZ&Me. Will need to be under 300% [employer]. Left VM with callback number.  Authorization # [id number]  Chesley Mires, PharmD, MPH, BCPS, CPP Clinical Pharmacist (Rheumatology and Pulmonology)

## 2023-04-15 DIAGNOSIS — J45909 Unspecified asthma, uncomplicated: Secondary | ICD-10-CM | POA: Diagnosis not present

## 2023-04-15 DIAGNOSIS — E2749 Other adrenocortical insufficiency: Secondary | ICD-10-CM | POA: Diagnosis not present

## 2023-04-17 ENCOUNTER — Telehealth: Payer: Self-pay | Admitting: Internal Medicine

## 2023-04-17 MED ORDER — AMOXICILLIN-POT CLAVULANATE 875-125 MG PO TABS: 1.0000 | ORAL_TABLET | Freq: Two times a day (BID) | ORAL | 1 refills | Status: DC

## 2023-04-17 NOTE — Telephone Encounter (Signed)
 Called and spoke to patient.  C/o sinus congestion, headache, dry cough at times prod with yellow sputum, wheezing and increased SOB. Sx have been off and on for 37mo.  He takes Hydrocortisone  20mg , Symbicort  BID, and Ventolin  PRN (2-3x weekly). No recent covid or flu test.  She is requesting abx. Augmentin  has worked well for him previously.   Dr. Neysa, please advise. Thanks

## 2023-04-17 NOTE — Telephone Encounter (Signed)
 Augmentin  with one refill sent to [facility]

## 2023-04-17 NOTE — Telephone Encounter (Signed)
 Patient states having sinus congestion, headache and cough. Would like antibx. Pharmacy is [facility]. Patient phone number is [phone].

## 2023-04-22 NOTE — Telephone Encounter (Signed)
Signed LTD form faxed to [facility] - fax# [phone].  $29 processing fee added to chart.

## 2023-05-09 ENCOUNTER — Other Ambulatory Visit: Payer: Self-pay | Admitting: Internal Medicine

## 2023-05-09 DIAGNOSIS — J4550 Severe persistent asthma, uncomplicated: Secondary | ICD-10-CM

## 2023-07-13 ENCOUNTER — Telehealth: Payer: Self-pay

## 2023-07-13 DIAGNOSIS — J4550 Severe persistent asthma, uncomplicated: Secondary | ICD-10-CM

## 2023-07-13 NOTE — Telephone Encounter (Signed)
 Copied from CRM [id number]. Topic: Clinical - Medical Advice >> Jul 13, 2023 10:15 AM Hilton Lucky wrote: Reason for CRM: Patient would like to be advised by physician if he  recommends going from bimonthly to monthly regarding his FASENRA  PEN 30 MG/ML prefilled autoinjector. Please return call to patient to advise as patient states t was helping him and then sort of just quit.  Please advise

## 2023-07-13 NOTE — Telephone Encounter (Signed)
 This message was intended for physician. Fasenra  is approved through insurance for 6 weeks dosing (as FDA approved). Can't guarantee that monthly would be covered for Medicare plans in particular..  Routing to Dr. Linder Revere

## 2023-07-14 DIAGNOSIS — Z0000 Encounter for general adult medical examination without abnormal findings: Secondary | ICD-10-CM | POA: Diagnosis not present

## 2023-07-14 DIAGNOSIS — K909 Intestinal malabsorption, unspecified: Secondary | ICD-10-CM | POA: Diagnosis not present

## 2023-07-14 DIAGNOSIS — Z87898 Personal history of other specified conditions: Secondary | ICD-10-CM | POA: Diagnosis not present

## 2023-07-14 DIAGNOSIS — Z79899 Other long term (current) drug therapy: Secondary | ICD-10-CM | POA: Diagnosis not present

## 2023-07-14 DIAGNOSIS — L820 Inflamed seborrheic keratosis: Secondary | ICD-10-CM | POA: Diagnosis not present

## 2023-07-14 DIAGNOSIS — E7800 Pure hypercholesterolemia, unspecified: Secondary | ICD-10-CM | POA: Diagnosis not present

## 2023-07-14 DIAGNOSIS — K219 Gastro-esophageal reflux disease without esophagitis: Secondary | ICD-10-CM | POA: Diagnosis not present

## 2023-07-14 NOTE — Telephone Encounter (Signed)
 Copied from CRM [id number]. Topic: Clinical - Medical Advice >> Jul 14, 2023  2:29 PM Evie Hoff wrote: Patient is still waiting for a call back concerning the information about the message that was sent on yesterday . Did let the patient know that we are still waiting fir the doctor to respond . Please reach out to patient with this information  [phone] Patient would like for you to leave a detailed message if he is not able to answer   Patient calling in regards to previous message. Waiting for Dr. Linder Revere to advise

## 2023-07-15 NOTE — Telephone Encounter (Signed)
 Suggest Eric [name] be changed to every 6 week dosing schedule for his Fasenra  to see if that works for him.

## 2023-07-16 MED ORDER — FASENRA PEN 30 MG/ML ~~LOC~~ SOAJ
30.0000 mg | SUBCUTANEOUS | 2 refills | Status: DC
Start: 2023-07-16 — End: 2023-11-06

## 2023-07-16 NOTE — Telephone Encounter (Signed)
 Hello Pharmacy team!  Do I need to inform you of Dr. Antonette Batters change in patients rx?  Thank you!

## 2023-07-16 NOTE — Telephone Encounter (Signed)
 New prescription sent for Fasenra  for every 6 week dosing. Insurance may or may not accept this increased frequency, but Dr. Linder Revere and pharmacy team are willing to try.   Attempted to call the patient to discuss this. Unable to reach him. LVM.  Tolu Daylah Sayavong, PharmD [facility] PGY-1

## 2023-07-21 NOTE — Telephone Encounter (Addendum)
 Received Medication Clarification Request fax from [facility] to confirm off label dosing for Fasenra  Pen dosing to every 6 weeks.  Dr. Linder Revere signed form and form was faxed back to [facility] at [phone].

## 2023-08-10 NOTE — Progress Notes (Signed)
 Patient ID: Eric [name], male    DOB: 1955/05/07, 68 y.o.   MRN: [id number]  HPI male never smoker followed for chronic obstructive asthma, ended Xolair  08/28/2011, hx RML resection age 89, chronic sinus disease, restless legs, complicated by GERD  CF Sweat Chloride Test  Negative (remote) Office spirometry 04/08/12- severe obstructive airways disease- FVC 2.64/58%, FEV1 1.30/36%, FEV1/FVC 0.49, FEF 25-75% 0.50/14%.   CT chest 04/01/2016 showed emphysema and scarring with chronic bronchitis changes but no ILD and no bronchiectasis PFT4/4/22- severe obstruction, no resp to BD, possible restriction, Nl DLCO a1AT- 09/02/21- 127 ---------------------------------------------------------------------------   10/09/22- 68 year old male never smoker followed for Asthma/COPD mixed type/ overlap, ended Xolair  08/28/2011,  ended Dupixent  , ended Tezspire , repeat trial of Fasenra , history RML resection age 54, chronic sinus disease, Steroid dependent/ Adrenal Insufficiency, restless legs, complicated by GERD -Symbicort  160, Hydrocortisone maint 20 mg daily  .Neb Atrovent / albuterol , albuterol  hfa,   Hydrocortisone 15 mg daily,  Tezpire changed back to Fasenra  05/23/21, -----Asthma, he states has used albuterol  1-2 times daily Room air O2 sat 97%, HR 71 Last prednisone  6 months ago.  Fasenra  probably helps- asthma is less active and he notes less allergic rhinitis. Does admit more nasal congestion in past month- using Flonase  and Afrin.  04/13/23- 68 year old male never smoker followed for Asthma/COPD mixed type/ overlap, ended Xolair  08/28/2011,  ended Dupixent  , ended Tezspire , repeat trial of Fasenra , history RML resection age 62, chronic sinus disease, Steroid dependent/ Adrenal Insufficiency, restless legs, complicated by GERD -Symbicort  160, Hydrocortisone maint 20 mg daily  .Neb Atrovent / albuterol , albuterol  hfa,   Hydrocortisone 15 mg daily,  Tezpire changed back to Fasenra  05/23/21, Discussed the use of  AI scribe software for clinical note transcription with the patient, who gave verbal consent to proceed.  History of Present Illness   The patient, with a history of nasal polyps and asthma/ COPD, presents with persistent head and chest congestion that has been ongoing throughout the fall and winter. The patient describes the congestion as 'stuffy' and denies any signs of infection. The patient has attempted to mitigate potential environmental triggers by changing bedding and improving air quality in his living spaces, but the symptoms persist.  The patient is currently on Cortef (hydrocortisone) 20mg  daily for adrenal insufficiency (Dr Faythe) and has been advised by his endocrinologist to double the dose in times of stress. The patient also takes Fasenra  for asthma and reports initial significant improvement with this medication, but the effect seems to have diminished over time. The patient is switching to [name] and is unsure if Fasenra  will be covered under the new plan.    Assessment and Plan:    Chronic Sinusitis Persistent head congestion without signs of infection. Nasal polyps suspected but not visualized anteriorly. -Start prednisone  taper from 40mg  daily for one week, then resume daily Cortef.  Asthma On Symbicort  and Fasenra  with some benefit. Uncertainty about continued coverage of Fasenra  with new Medicare insurance. -Continue Symbicort  and Fasenra  for now. -Use remaining Fasenra  doses and then consider discontinuation depending on insurance coverage and symptom control. -Contact pharmacy team to discuss potential coverage of other biologics under new insurance.  Adrenal Insufficiency On Cortef 20mg  daily. Discussed potential need to double dose during times of stress per endocrinologist's advice. -Continue Cortef 20mg  daily. -Double dose as needed during times of stress.  Follow-up in 4 months or sooner if symptoms worsen.   08/11/23- 68 year old male never smoker  followed for Asthma/COPD mixed type/ overlap, ended Xolair   08/28/2011,  ended Dupixent  , ended Tezspire , repeat trial of Fasenra , history RML resection age 65, chronic sinus disease, Steroid dependent/ Adrenal Insufficiency, restless legs, complicated by GERD -Symbicort  160, Hydrocortisone maint 20 mg daily  .Neb Atrovent / albuterol , albuterol  hfa,   Hydrocortisone 15 mg daily,  Tezpire changed back to Fasenra  05/23/21  4 month f/u.  Fasenra  has worked best initially, but 8 week interval too long- symptoms return at about 6 weeks. We are going to try 6 week interval. Chronic rhinitis with nasal congestion/ drainage is art of the problem. Will refer to ENT.     Review of Systems-See HPI   + = positive Constitutional:   No weight loss, night sweats,  Fevers, chills, fatigue, lassitude. HEENT:   No headaches,  Difficulty swallowing,  Tooth/dental problems,  Sore throat,                No sneezing, itching, ear ache, +-nasal congestion, post nasal drip,  CV:   chest pain, orthopnea, PND, swelling in lower extremities, anasarca, dizziness, palpitations GI    Heartburn, indigestion, no-abdominal pain, nausea, vomiting, Resp:    +-productive cough,  + non-productive cough,  No coughing up of blood.  No change in color of mucus.                                       wheezing. +Short of breath mainly w/ exertion.  Skin: no rash or lesions. GU:  MS:  No joint pain or swelling. SABRA Psych:  No change in mood or affect. No depression or anxiety.  No memory loss.  Objective:   Physical Exam General- Alert, Oriented, Affect-appropriate, Distress- none acute,  Looks well-Not cushingoid  Skin- +echymoses on arms- chronic steroids  Lymphadenopathy- none Head- atraumatic            Eyes- Gross vision intact, PERRLA, conjunctivae- clear secretions            Ears- Hearing, canals normal            Nose- +  stuffy, no-Septal dev,  polyps-none visible, erosion, perforation             Throat- Mallampati II ,  mucosa clear , drainage- none, tonsils- atrophic.  Neck- flexible , trachea midline, no stridor , thyroid nl, carotid no bruit Chest - symmetrical excursion , unlabored           Heart/CV- RRR , no murmur , no gallop  , no rub, nl s1 s2                           - JVD- none , edema- none, stasis changes- none, varices- none           Lung-    Diminished, Wheeze-none,  able to speak full sentences.                                        dullness-none, rub- none.            Chest wall-  Abd- Br/ Gen/ Rectal- Not done, not indicated Extrem- cyanosis- none, clubbing, none, atrophy- none, strength- nl Neuro- grossly intact to observation

## 2023-08-11 ENCOUNTER — Ambulatory Visit: Payer: Medicare Other | Admitting: Internal Medicine

## 2023-08-11 ENCOUNTER — Encounter: Payer: Self-pay | Admitting: Internal Medicine

## 2023-08-11 VITALS — BP 118/72 | HR 77 | Temp 98.1°F | Ht 68.0 in | Wt 167.6 lb

## 2023-08-11 DIAGNOSIS — J310 Chronic rhinitis: Secondary | ICD-10-CM

## 2023-08-11 NOTE — Patient Instructions (Signed)
 Order- referral to [facility] ENT    dx Chronic Rhinitis  Recommend you try Fasenra  injection every 6 weeks

## 2023-08-18 ENCOUNTER — Other Ambulatory Visit: Payer: Self-pay | Admitting: Internal Medicine

## 2023-08-21 ENCOUNTER — Other Ambulatory Visit: Payer: Self-pay

## 2023-08-21 DIAGNOSIS — J4550 Severe persistent asthma, uncomplicated: Secondary | ICD-10-CM

## 2023-08-21 MED ORDER — ALBUTEROL SULFATE HFA 108 (90 BASE) MCG/ACT IN AERS
2.0000 | INHALATION_SPRAY | Freq: Four times a day (QID) | RESPIRATORY_TRACT | 6 refills | Status: DC | PRN
Start: 2023-08-21 — End: 2024-01-07

## 2023-08-21 NOTE — Telephone Encounter (Signed)
 Ok refill albuterol  HFA.  Patients last OV 08/11/2023.  Sent in rx.

## 2023-08-28 ENCOUNTER — Telehealth: Payer: Self-pay | Admitting: Pharmacist

## 2023-08-28 NOTE — Telephone Encounter (Signed)
 Submitted a Prior Authorization RENEWAL request to [facility] for FASENRA  via CoverMyMeds. Will update once we receive a response.  Key: [id number]

## 2023-08-28 NOTE — Telephone Encounter (Signed)
 Received notification from [employer] regarding a prior authorization for YUSIMRY. Authorization has been APPROVED from 08/28/2023 to 03/09/2024. Approval letter sent to scan center.  Authorization # [id number]  Geraldene Kleine, PharmD, MPH, BCPS, CPP Clinical Pharmacist (Rheumatology and Pulmonology)

## 2023-09-08 DIAGNOSIS — E2749 Other adrenocortical insufficiency: Secondary | ICD-10-CM | POA: Diagnosis not present

## 2023-09-08 DIAGNOSIS — J45909 Unspecified asthma, uncomplicated: Secondary | ICD-10-CM | POA: Diagnosis not present

## 2023-09-08 DIAGNOSIS — M8589 Other specified disorders of bone density and structure, multiple sites: Secondary | ICD-10-CM | POA: Diagnosis not present

## 2023-09-09 ENCOUNTER — Other Ambulatory Visit (HOSPITAL_BASED_OUTPATIENT_CLINIC_OR_DEPARTMENT_OTHER): Payer: Self-pay | Admitting: Internal Medicine

## 2023-09-09 DIAGNOSIS — M8589 Other specified disorders of bone density and structure, multiple sites: Secondary | ICD-10-CM

## 2023-10-08 ENCOUNTER — Ambulatory Visit (HOSPITAL_BASED_OUTPATIENT_CLINIC_OR_DEPARTMENT_OTHER)
Admission: RE | Admit: 2023-10-08 | Discharge: 2023-10-08 | Disposition: A | Discharge status: Home / Self Care | Source: Ambulatory Visit | Attending: Internal Medicine | Admitting: Internal Medicine

## 2023-10-08 DIAGNOSIS — M85851 Other specified disorders of bone density and structure, right thigh: Secondary | ICD-10-CM | POA: Diagnosis not present

## 2023-10-08 DIAGNOSIS — M8589 Other specified disorders of bone density and structure, multiple sites: Secondary | ICD-10-CM | POA: Diagnosis not present

## 2023-10-09 ENCOUNTER — Other Ambulatory Visit: Payer: Self-pay

## 2023-10-09 MED ORDER — HYDROCORTISONE 10 MG PO TABS: 20.0000 mg | ORAL_TABLET | Freq: Every day | ORAL | 5 refills | Status: DC

## 2023-10-09 NOTE — Telephone Encounter (Signed)
 Received fax from [facility].  Patient wants refill on Hydrocortisone tablet.  Per chart OV note from 08/11/2023: Hydrocortisone maint 20 mg daily.  Okay refill.

## 2023-11-02 DIAGNOSIS — C44729 Squamous cell carcinoma of skin of left lower limb, including hip: Secondary | ICD-10-CM | POA: Diagnosis not present

## 2023-11-05 ENCOUNTER — Other Ambulatory Visit: Payer: Self-pay | Admitting: Internal Medicine

## 2023-11-05 DIAGNOSIS — J4550 Severe persistent asthma, uncomplicated: Secondary | ICD-10-CM

## 2023-11-06 NOTE — Telephone Encounter (Signed)
 Refill sent for FASENRA  to [facility]: [phone]   Dose: 30mg  [name] q6 weeks - see phone note 07/13/23   Of note, patient recently switched to [name]. Fasenra  PA approved under new insurance, see phone note 08/28/23.   Last OV: 08/11/23 Provider: Dr. Neysa  Next OV: due around 02/10/24, not yet scheduled  [name], PharmD, BCPS Clinical Pharmacist  [facility]

## 2023-11-06 NOTE — Telephone Encounter (Signed)
 Pt requesting refill of specialty medication, Fasenra . Routing to Rx team.

## 2023-11-19 ENCOUNTER — Telehealth: Payer: Self-pay | Admitting: Internal Medicine

## 2023-11-19 NOTE — Telephone Encounter (Signed)
 Copied from CRM #[id number]. Topic: General - Other >> Nov 19, 2023 11:08 AM Rilla B wrote: Patient received a text message regarding CT and then to see [facility] [place]. Patient states he has no knowledge of a CT and he and Dr Neysa has never discussed him having one.  Patient believes message was for another patient. Please call patient if you have any questions at  [phone].

## 2023-11-19 NOTE — Telephone Encounter (Signed)
 I have reviewed patient's chart, I do not see mentioning of CT, nor was there a referral placed to the lung cancer screening program.  Lm for patient.   Chest center, please advise.

## 2023-11-23 DIAGNOSIS — H43823 Vitreomacular adhesion, bilateral: Secondary | ICD-10-CM | POA: Diagnosis not present

## 2023-11-23 DIAGNOSIS — H2513 Age-related nuclear cataract, bilateral: Secondary | ICD-10-CM | POA: Diagnosis not present

## 2023-11-23 DIAGNOSIS — Z79899 Other long term (current) drug therapy: Secondary | ICD-10-CM | POA: Diagnosis not present

## 2023-11-23 DIAGNOSIS — H25043 Posterior subcapsular polar age-related cataract, bilateral: Secondary | ICD-10-CM | POA: Diagnosis not present

## 2023-11-23 NOTE — Telephone Encounter (Signed)
 Spoke to patient. He stated that this message was for his dad.  Nothing further needed.

## 2023-12-15 ENCOUNTER — Telehealth: Payer: Self-pay | Admitting: Internal Medicine

## 2023-12-15 DIAGNOSIS — J4489 Other specified chronic obstructive pulmonary disease: Secondary | ICD-10-CM

## 2023-12-15 MED ORDER — ALPRAZOLAM 0.25 MG PO TABS: 0.2500 mg | ORAL_TABLET | Freq: Two times a day (BID) | ORAL | 1 refills | Status: AC | PRN

## 2023-12-15 NOTE — Telephone Encounter (Signed)
 He asks to reduce alprazolam  to 0.25 mg tabs- done.

## 2023-12-15 NOTE — Telephone Encounter (Signed)
 Pt came in and requested that his prescription for ALPRAZolam  (XANAX ) 0.5 MG tablet be cut in half. Please contact Pt.

## 2023-12-21 DIAGNOSIS — R454 Irritability and anger: Secondary | ICD-10-CM | POA: Diagnosis not present

## 2023-12-21 DIAGNOSIS — Z23 Encounter for immunization: Secondary | ICD-10-CM | POA: Diagnosis not present

## 2023-12-21 DIAGNOSIS — J45909 Unspecified asthma, uncomplicated: Secondary | ICD-10-CM | POA: Diagnosis not present

## 2023-12-21 DIAGNOSIS — Z7952 Long term (current) use of systemic steroids: Secondary | ICD-10-CM | POA: Diagnosis not present

## 2023-12-21 DIAGNOSIS — E2749 Other adrenocortical insufficiency: Secondary | ICD-10-CM | POA: Diagnosis not present

## 2024-01-06 ENCOUNTER — Other Ambulatory Visit: Payer: Self-pay | Admitting: Internal Medicine

## 2024-01-06 DIAGNOSIS — J4550 Severe persistent asthma, uncomplicated: Secondary | ICD-10-CM

## 2024-01-06 NOTE — Telephone Encounter (Unsigned)
 Copied from CRM [id number]. Topic: Clinical - Medication Refill >> Jan 06, 2024  4:45 PM Rilla B wrote: Medication: albuterol  (VENTOLIN  HFA) 108 (90 Base) MCG/ACT inhaler  Patient states the last inhaler was terrible.   *He states the CIPLA (white with orange packing) works best.    Has the patient contacted their pharmacy? Yes (Agent: If no, request that the patient contact the pharmacy for the refill. If patient does not wish to contact the pharmacy document the reason why and proceed with request.) (Agent: If yes, when and what did the pharmacy advise?)  This is the patient's preferred pharmacy:  [id number] - [place], [place] - [address] AT [address] [place] [address] Phone: [phone] Fax: [phone]  Is this the correct pharmacy for this prescription? Yes If no, delete pharmacy and type the correct one.   Has the prescription been filled recently? Yes  Is the patient out of the medication? No  Has the patient been seen for an appointment in the last year OR does the patient have an upcoming appointment? Yes  Can we respond through MyChart? Yes  Agent: Please be advised that Rx refills may take up to 3 business days. We ask that you follow-up with your pharmacy.

## 2024-01-07 ENCOUNTER — Other Ambulatory Visit (HOSPITAL_BASED_OUTPATIENT_CLINIC_OR_DEPARTMENT_OTHER): Payer: Self-pay

## 2024-01-07 DIAGNOSIS — J4550 Severe persistent asthma, uncomplicated: Secondary | ICD-10-CM

## 2024-01-07 MED ORDER — ALBUTEROL SULFATE HFA 108 (90 BASE) MCG/ACT IN AERS: 2.0000 | INHALATION_SPRAY | Freq: Four times a day (QID) | RESPIRATORY_TRACT | 6 refills | Status: DC | PRN

## 2024-02-17 ENCOUNTER — Other Ambulatory Visit: Payer: Self-pay | Admitting: Internal Medicine

## 2024-02-17 DIAGNOSIS — J4550 Severe persistent asthma, uncomplicated: Secondary | ICD-10-CM

## 2024-02-17 NOTE — Telephone Encounter (Signed)
 Pt requesting refill of specialty medication - routing to Rx team to advise.

## 2024-02-17 NOTE — Telephone Encounter (Signed)
 Refill sent for FASENRA  to [facility]: [phone]   Dose: 30mg  [place] every 6 weeks  Last OV: 08/11/23 Provider: Dr. Neysa  Next OV: due December 2025, not scheduled   Routing to scheduling team for follow-up on appt scheduling  Eric [name], PharmD, BCPS Clinical Pharmacist  [facility]

## 2024-02-18 NOTE — Telephone Encounter (Signed)
 Attempted to call patient left voicemail for him to call our office back to get scheduled.

## 2024-02-18 NOTE — Telephone Encounter (Signed)
 Appointment scheduled.

## 2024-02-22 NOTE — Progress Notes (Signed)
 "  Patient ID: Eric [name], male    DOB: [birth year], 68 y.o.   MRN: [id number]  HPI male never smoker followed for chronic obstructive asthma, ended Xolair  08/28/2011, hx RML resection age 10, chronic sinus disease, restless legs, complicated by GERD  CF Sweat Chloride Test  Negative (remote) Office spirometry 04/08/12- severe obstructive airways disease- FVC 2.64/58%, FEV1 1.30/36%, FEV1/FVC 0.49, FEF 25-75% 0.50/14%.   CT chest 04/01/2016 showed emphysema and scarring with chronic bronchitis changes but no ILD and no bronchiectasis PFT4/4/22- severe obstruction, no resp to BD, possible restriction, Nl DLCO a1AT- 09/02/21- 127 ---------------------------------------------------------------------------   08/11/23- 69 year old male never smoker followed for Asthma/COPD mixed type/ overlap, ended Xolair  08/28/2011,  ended Dupixent  , ended Tezspire , repeat trial of Fasenra , history RML resection age 58, chronic sinus disease, Steroid dependent/ Adrenal Insufficiency, restless legs, complicated by GERD -Symbicort  160, Hydrocortisone  maint 20 mg daily  .Neb Atrovent / albuterol , albuterol  hfa,   Hydrocortisone  15 mg daily,  Tezpire changed back to Fasenra  05/23/21  4 month f/u.  Fasenra  has worked best initially, but 8 week interval too long- symptoms return at about 6 weeks. We are going to try 6 week interval. Chronic rhinitis with nasal congestion/ drainage is art of the problem. Will refer to ENT.  02/23/24- 68 year old male never smoker followed for Asthma/COPD mixed type/ overlap, ended Xolair  08/28/2011,  ended Dupixent  , ended Tezspire , repeat trial of Fasenra , history RML resection age 39, chronic sinus disease, Steroid dependent/ Adrenal Insufficiency, restless legs, complicated by GERD -Breztri ,  Hydrocortisone  maint 20 mg daily  .Neb Atrovent / albuterol , albuterol  hfa,   Hydrocortisone  15 mg daily,  Tezpire changed back to Fasenra  05/23/21 He continues Fasenra  with benefit. Discussed the use of  AI scribe software for clinical note transcription with the patient, who gave verbal consent to proceed.  History of Present Illness   Eric [name] is a 68 year old male who presents for medication management and follow-up of respiratory issues.  He has not needed albuterol  recently but some of his supply has expired. He requests a prescription for a larger standard-size albuterol  inhaler, which he prefers.  This morning he noticed a stridulous quality to his breathing with cough. After using Symbicort  his cough becomes productive and he does not develop chest tightness.  He uses Symbicort  and albuterol  for his respiratory symptoms. He has Augmentin  and doxycycline  at home but has not needed to take them recently.     Assessment and Plan:    Asthma/ COPD overlap Mild stridor and productive cough post-Symbicort . No recent hospitalizations or albuterol  use. - Prescribed albuterol  solution.  - Continue Symbicort . -Discussed follow-up here after I retire             Review of Systems-See HPI   + = positive Constitutional:   No weight loss, night sweats,  Fevers, chills, fatigue, lassitude. HEENT:   No headaches,  Difficulty swallowing,  Tooth/dental problems,  Sore throat,                No sneezing, itching, ear ache, +-nasal congestion, post nasal drip,  CV:   chest pain, orthopnea, PND, swelling in lower extremities, anasarca, dizziness, palpitations GI    Heartburn, indigestion, no-abdominal pain, nausea, vomiting, Resp:    +-productive cough,  + non-productive cough,  No coughing up of blood.  No change in color of mucus.  wheezing. +Short of breath mainly w/ exertion.  Skin: no rash or lesions. GU:  MS:  No joint pain or swelling. SABRA Psych:  No change in mood or affect. No depression or anxiety.  No memory loss.  Objective:   Physical Exam General- Alert, Oriented, Affect-appropriate, Distress- none acute,  Looks well-Not cushingoid   Skin- +echymoses on arms- chronic steroids  Lymphadenopathy- none Head- atraumatic            Eyes- Gross vision intact, PERRLA, conjunctivae- clear secretions            Ears- Hearing, canals normal            Nose- +  stuffy, no-Septal dev,  polyps-none visible, erosion, perforation             Throat- Mallampati II , mucosa clear , drainage- none, tonsils- atrophic.  Neck- flexible , trachea midline, no stridor , thyroid nl, carotid no bruit Chest - symmetrical excursion , unlabored           Heart/CV- RRR , no murmur , no gallop  , no rub, nl s1 s2                           - JVD- none , edema- none, stasis changes- none, varices- none           Lung-    Diminished, Wheeze-none,  able to speak full sentences.                                        dullness-none, rub- none.            Chest wall-  Abd- Br/ Gen/ Rectal- Not done, not indicated Extrem- cyanosis- none, clubbing, none, atrophy- none, strength- nl Neuro- grossly intact to observation        "

## 2024-02-23 ENCOUNTER — Encounter: Payer: Self-pay | Admitting: Internal Medicine

## 2024-02-23 ENCOUNTER — Ambulatory Visit: Admitting: Internal Medicine

## 2024-02-23 DIAGNOSIS — J4489 Other specified chronic obstructive pulmonary disease: Secondary | ICD-10-CM

## 2024-02-23 DIAGNOSIS — J4550 Severe persistent asthma, uncomplicated: Secondary | ICD-10-CM

## 2024-02-23 MED ORDER — ALBUTEROL SULFATE (2.5 MG/3ML) 0.083% IN NEBU: INHALATION_SOLUTION | RESPIRATORY_TRACT | 3 refills | Status: AC

## 2024-02-23 MED ORDER — ALBUTEROL SULFATE HFA 108 (90 BASE) MCG/ACT IN AERS: 2.0000 | INHALATION_SPRAY | Freq: Four times a day (QID) | RESPIRATORY_TRACT | 12 refills | Status: AC | PRN

## 2024-02-23 NOTE — Patient Instructions (Signed)
 Glad you are doing well.  Refills sent for albuterol  inhaler and neb solution.

## 2024-03-07 ENCOUNTER — Encounter: Payer: Self-pay | Admitting: Internal Medicine

## 2024-03-16 ENCOUNTER — Other Ambulatory Visit (HOSPITAL_COMMUNITY): Payer: Self-pay

## 2024-03-16 ENCOUNTER — Telehealth: Payer: Self-pay

## 2024-03-16 NOTE — Telephone Encounter (Signed)
 Submitted a Prior Authorization request to [facility] for FASENRA  via CoverMyMeds. Authorization has been CANCELLED due to being previously approved under [id number] from 03/10/24 to 03/09/25. Approval letter sent to scan center.   Patient can fill through [facility]: [phone]   CMM KEY: [id number]

## 2024-03-17 ENCOUNTER — Encounter: Payer: Self-pay | Admitting: Pulmonary Disease

## 2024-03-17 DIAGNOSIS — J4550 Severe persistent asthma, uncomplicated: Secondary | ICD-10-CM

## 2024-03-17 MED ORDER — FASENRA PEN 30 MG/ML ~~LOC~~ SOAJ: 30.0000 mg | SUBCUTANEOUS | 2 refills | Status: AC

## 2024-03-17 NOTE — Telephone Encounter (Signed)
 Patient is requesting refill on fasenra 

## 2024-03-28 ENCOUNTER — Encounter: Payer: Self-pay | Admitting: Pulmonary Disease

## 2024-04-07 MED ORDER — BUDESONIDE-FORMOTEROL FUMARATE 160-4.5 MCG/ACT IN AERO: INHALATION_SPRAY | RESPIRATORY_TRACT | 3 refills | Status: AC

## 2024-04-09 ENCOUNTER — Other Ambulatory Visit: Payer: Self-pay | Admitting: Internal Medicine

## 2024-08-23 ENCOUNTER — Encounter: Admitting: Pulmonary Disease
